# Patient Record
Sex: Female | Born: 1950
Health system: Southern US, Community
[De-identification: ages and names within clinical notes are randomized; demographics above are authoritative.]

## PROBLEM LIST (undated history)

## (undated) DIAGNOSIS — M797 Fibromyalgia: Secondary | ICD-10-CM

## (undated) DIAGNOSIS — H409 Unspecified glaucoma: Secondary | ICD-10-CM

## (undated) DIAGNOSIS — Z9221 Personal history of antineoplastic chemotherapy: Secondary | ICD-10-CM

## (undated) DIAGNOSIS — E785 Hyperlipidemia, unspecified: Secondary | ICD-10-CM

## (undated) DIAGNOSIS — E119 Type 2 diabetes mellitus without complications: Secondary | ICD-10-CM

## (undated) DIAGNOSIS — C50919 Malignant neoplasm of unspecified site of unspecified female breast: Secondary | ICD-10-CM

## (undated) DIAGNOSIS — E7800 Pure hypercholesterolemia, unspecified: Secondary | ICD-10-CM

## (undated) DIAGNOSIS — Z923 Personal history of irradiation: Secondary | ICD-10-CM

## (undated) DIAGNOSIS — M1990 Unspecified osteoarthritis, unspecified site: Secondary | ICD-10-CM

## (undated) HISTORY — PX: SHOULDER SURGERY: SHX246

## (undated) HISTORY — DX: Malignant neoplasm of unspecified site of unspecified female breast: C50.919

## (undated) HISTORY — PX: KNEE ARTHROSCOPY: SUR90

## (undated) HISTORY — DX: Type 2 diabetes mellitus without complications: E11.9

## (undated) HISTORY — DX: Unspecified glaucoma: H40.9

## (undated) HISTORY — DX: Pure hypercholesterolemia, unspecified: E78.00

## (undated) HISTORY — DX: Hyperlipidemia, unspecified: E78.5

## (undated) HISTORY — DX: Unspecified osteoarthritis, unspecified site: M19.90

## (undated) HISTORY — DX: Fibromyalgia: M79.7

---

## 1985-01-26 HISTORY — PX: VAGINAL HYSTERECTOMY: SUR661

## 1987-01-27 HISTORY — PX: APPENDECTOMY: SHX54

## 1987-01-27 HISTORY — PX: CHOLECYSTECTOMY: SHX55

## 1999-06-05 ENCOUNTER — Encounter: Payer: Self-pay | Admitting: Family Medicine

## 1999-06-05 ENCOUNTER — Encounter: Admission: RE | Admit: 1999-06-05 | Discharge: 1999-06-05 | Payer: Self-pay | Admitting: Family Medicine

## 1999-08-01 ENCOUNTER — Encounter: Payer: Self-pay | Admitting: Family Medicine

## 1999-08-01 ENCOUNTER — Ambulatory Visit (HOSPITAL_COMMUNITY): Admission: RE | Admit: 1999-08-01 | Discharge: 1999-08-01 | Payer: Self-pay | Admitting: Family Medicine

## 2000-01-27 DIAGNOSIS — C50919 Malignant neoplasm of unspecified site of unspecified female breast: Secondary | ICD-10-CM

## 2000-01-27 DIAGNOSIS — Z923 Personal history of irradiation: Secondary | ICD-10-CM

## 2000-01-27 HISTORY — DX: Malignant neoplasm of unspecified site of unspecified female breast: C50.919

## 2000-01-27 HISTORY — DX: Personal history of irradiation: Z92.3

## 2000-04-26 ENCOUNTER — Encounter: Payer: Self-pay | Admitting: *Deleted

## 2000-04-26 ENCOUNTER — Other Ambulatory Visit: Admission: RE | Admit: 2000-04-26 | Discharge: 2000-04-26 | Payer: Self-pay | Admitting: *Deleted

## 2000-04-26 ENCOUNTER — Encounter: Admission: RE | Admit: 2000-04-26 | Discharge: 2000-04-26 | Payer: Self-pay | Admitting: *Deleted

## 2000-04-26 HISTORY — PX: BREAST BIOPSY: SHX20

## 2000-05-05 ENCOUNTER — Encounter (HOSPITAL_BASED_OUTPATIENT_CLINIC_OR_DEPARTMENT_OTHER): Payer: Self-pay | Admitting: General Surgery

## 2000-05-06 ENCOUNTER — Ambulatory Visit (HOSPITAL_COMMUNITY): Admission: RE | Admit: 2000-05-06 | Discharge: 2000-05-06 | Payer: Self-pay | Admitting: General Surgery

## 2000-05-06 ENCOUNTER — Encounter (HOSPITAL_BASED_OUTPATIENT_CLINIC_OR_DEPARTMENT_OTHER): Payer: Self-pay | Admitting: General Surgery

## 2000-05-06 ENCOUNTER — Encounter (INDEPENDENT_AMBULATORY_CARE_PROVIDER_SITE_OTHER): Payer: Self-pay | Admitting: *Deleted

## 2000-05-14 HISTORY — PX: BREAST LUMPECTOMY: SHX2

## 2000-05-25 ENCOUNTER — Emergency Department (HOSPITAL_COMMUNITY): Admission: EM | Admit: 2000-05-25 | Discharge: 2000-05-25 | Payer: Self-pay | Admitting: Emergency Medicine

## 2000-05-26 DIAGNOSIS — Z9221 Personal history of antineoplastic chemotherapy: Secondary | ICD-10-CM

## 2000-05-26 HISTORY — DX: Personal history of antineoplastic chemotherapy: Z92.21

## 2000-06-07 ENCOUNTER — Encounter: Payer: Self-pay | Admitting: *Deleted

## 2000-06-07 ENCOUNTER — Ambulatory Visit (HOSPITAL_COMMUNITY): Admission: RE | Admit: 2000-06-07 | Discharge: 2000-06-07 | Payer: Self-pay | Admitting: *Deleted

## 2000-06-14 ENCOUNTER — Encounter (HOSPITAL_BASED_OUTPATIENT_CLINIC_OR_DEPARTMENT_OTHER): Payer: Self-pay | Admitting: General Surgery

## 2000-06-14 ENCOUNTER — Ambulatory Visit (HOSPITAL_COMMUNITY): Admission: RE | Admit: 2000-06-14 | Discharge: 2000-06-14 | Payer: Self-pay | Admitting: General Surgery

## 2000-09-28 ENCOUNTER — Ambulatory Visit: Admission: RE | Admit: 2000-09-28 | Discharge: 2000-12-27 | Payer: Self-pay | Admitting: Radiation Oncology

## 2000-09-30 ENCOUNTER — Encounter: Admission: RE | Admit: 2000-09-30 | Discharge: 2000-09-30 | Payer: Self-pay | Admitting: Radiation Oncology

## 2000-12-10 ENCOUNTER — Ambulatory Visit (HOSPITAL_COMMUNITY): Admission: RE | Admit: 2000-12-10 | Discharge: 2000-12-10 | Payer: Self-pay | Admitting: General Surgery

## 2001-04-27 ENCOUNTER — Encounter: Admission: RE | Admit: 2001-04-27 | Discharge: 2001-04-27 | Payer: Self-pay | Admitting: *Deleted

## 2001-04-27 ENCOUNTER — Encounter: Payer: Self-pay | Admitting: *Deleted

## 2001-06-03 ENCOUNTER — Encounter: Payer: Self-pay | Admitting: Family Medicine

## 2001-06-03 ENCOUNTER — Encounter: Admission: RE | Admit: 2001-06-03 | Discharge: 2001-06-03 | Payer: Self-pay | Admitting: Family Medicine

## 2001-06-29 ENCOUNTER — Other Ambulatory Visit: Admission: RE | Admit: 2001-06-29 | Discharge: 2001-06-29 | Payer: Self-pay | Admitting: Family Medicine

## 2002-05-01 ENCOUNTER — Encounter: Payer: Self-pay | Admitting: *Deleted

## 2002-05-01 ENCOUNTER — Encounter: Admission: RE | Admit: 2002-05-01 | Discharge: 2002-05-01 | Payer: Self-pay | Admitting: *Deleted

## 2002-10-17 ENCOUNTER — Other Ambulatory Visit: Admission: RE | Admit: 2002-10-17 | Discharge: 2002-10-17 | Payer: Self-pay | Admitting: Family Medicine

## 2003-02-10 ENCOUNTER — Encounter: Admission: RE | Admit: 2003-02-10 | Discharge: 2003-02-10 | Payer: Self-pay | Admitting: Orthopedic Surgery

## 2003-05-03 ENCOUNTER — Encounter: Admission: RE | Admit: 2003-05-03 | Discharge: 2003-05-03 | Payer: Self-pay | Admitting: Hematology & Oncology

## 2003-06-30 ENCOUNTER — Emergency Department (HOSPITAL_COMMUNITY): Admission: EM | Admit: 2003-06-30 | Discharge: 2003-06-30 | Payer: Self-pay

## 2003-08-01 ENCOUNTER — Encounter: Admission: RE | Admit: 2003-08-01 | Discharge: 2003-09-21 | Payer: Self-pay | Admitting: Orthopedic Surgery

## 2003-08-28 ENCOUNTER — Other Ambulatory Visit: Admission: RE | Admit: 2003-08-28 | Discharge: 2003-08-28 | Payer: Self-pay | Admitting: Family Medicine

## 2003-09-04 ENCOUNTER — Ambulatory Visit (HOSPITAL_COMMUNITY): Admission: RE | Admit: 2003-09-04 | Discharge: 2003-09-04 | Payer: Self-pay | Admitting: Family Medicine

## 2003-09-25 ENCOUNTER — Ambulatory Visit (HOSPITAL_COMMUNITY): Admission: RE | Admit: 2003-09-25 | Discharge: 2003-09-25 | Payer: Self-pay | Admitting: Obstetrics

## 2003-12-11 ENCOUNTER — Ambulatory Visit (HOSPITAL_COMMUNITY): Admission: RE | Admit: 2003-12-11 | Discharge: 2003-12-11 | Payer: Self-pay | Admitting: Orthopedic Surgery

## 2004-01-22 ENCOUNTER — Ambulatory Visit: Payer: Self-pay | Admitting: Hematology & Oncology

## 2004-05-05 ENCOUNTER — Encounter: Admission: RE | Admit: 2004-05-05 | Discharge: 2004-05-05 | Payer: Self-pay | Admitting: Hematology & Oncology

## 2004-07-22 ENCOUNTER — Ambulatory Visit: Payer: Self-pay | Admitting: Hematology & Oncology

## 2005-01-14 ENCOUNTER — Ambulatory Visit: Payer: Self-pay | Admitting: Hematology & Oncology

## 2005-05-06 ENCOUNTER — Encounter: Admission: RE | Admit: 2005-05-06 | Discharge: 2005-05-06 | Payer: Self-pay | Admitting: Hematology & Oncology

## 2005-07-16 ENCOUNTER — Ambulatory Visit: Payer: Self-pay | Admitting: Hematology & Oncology

## 2005-07-20 LAB — CBC WITH DIFFERENTIAL/PLATELET
BASO%: 0.7 % (ref 0.0–2.0)
Basophils Absolute: 0 10*3/uL (ref 0.0–0.1)
EOS%: 3.5 % (ref 0.0–7.0)
Eosinophils Absolute: 0.2 10*3/uL (ref 0.0–0.5)
HCT: 34.6 % — ABNORMAL LOW (ref 34.8–46.6)
HGB: 11.4 g/dL — ABNORMAL LOW (ref 11.6–15.9)
LYMPH%: 51 % — ABNORMAL HIGH (ref 14.0–48.0)
MCH: 28.1 pg (ref 26.0–34.0)
MCHC: 33.1 g/dL (ref 32.0–36.0)
MCV: 85.1 fL (ref 81.0–101.0)
MONO#: 0.4 10*3/uL (ref 0.1–0.9)
MONO%: 5.9 % (ref 0.0–13.0)
NEUT#: 2.6 10*3/uL (ref 1.5–6.5)
NEUT%: 38.9 % — ABNORMAL LOW (ref 39.6–76.8)
Platelets: 449 10*3/uL — ABNORMAL HIGH (ref 145–400)
RBC: 4.06 10*6/uL (ref 3.70–5.32)
RDW: 14 % (ref 11.3–14.5)
WBC: 6.7 10*3/uL (ref 3.9–10.0)
lymph#: 3.4 10*3/uL — ABNORMAL HIGH (ref 0.9–3.3)

## 2005-07-20 LAB — COMPREHENSIVE METABOLIC PANEL
ALT: 12 U/L (ref 0–40)
AST: 12 U/L (ref 0–37)
Albumin: 4.1 g/dL (ref 3.5–5.2)
Alkaline Phosphatase: 74 U/L (ref 39–117)
BUN: 13 mg/dL (ref 6–23)
CO2: 27 mEq/L (ref 19–32)
Calcium: 9.2 mg/dL (ref 8.4–10.5)
Chloride: 103 mEq/L (ref 96–112)
Creatinine, Ser: 0.7 mg/dL (ref 0.40–1.20)
Glucose, Bld: 85 mg/dL (ref 70–99)
Potassium: 3.6 mEq/L (ref 3.5–5.3)
Sodium: 139 mEq/L (ref 135–145)
Total Bilirubin: 0.3 mg/dL (ref 0.3–1.2)
Total Protein: 6.8 g/dL (ref 6.0–8.3)

## 2006-02-08 ENCOUNTER — Encounter: Admission: RE | Admit: 2006-02-08 | Discharge: 2006-03-11 | Payer: Self-pay | Admitting: Orthopedic Surgery

## 2006-03-01 ENCOUNTER — Encounter: Admission: RE | Admit: 2006-03-01 | Discharge: 2006-03-01 | Payer: Self-pay | Admitting: Orthopedic Surgery

## 2006-05-10 ENCOUNTER — Encounter: Admission: RE | Admit: 2006-05-10 | Discharge: 2006-05-10 | Payer: Self-pay | Admitting: Hematology & Oncology

## 2006-06-17 ENCOUNTER — Encounter: Admission: RE | Admit: 2006-06-17 | Discharge: 2006-06-17 | Payer: Self-pay | Admitting: Orthopedic Surgery

## 2006-07-17 ENCOUNTER — Encounter: Admission: RE | Admit: 2006-07-17 | Discharge: 2006-07-17 | Payer: Self-pay | Admitting: Orthopedic Surgery

## 2006-07-21 ENCOUNTER — Ambulatory Visit: Payer: Self-pay | Admitting: Hematology & Oncology

## 2006-07-26 LAB — COMPREHENSIVE METABOLIC PANEL
ALT: 16 U/L (ref 0–35)
AST: 19 U/L (ref 0–37)
Albumin: 4.5 g/dL (ref 3.5–5.2)
Alkaline Phosphatase: 72 U/L (ref 39–117)
BUN: 13 mg/dL (ref 6–23)
CO2: 24 mEq/L (ref 19–32)
Calcium: 9.6 mg/dL (ref 8.4–10.5)
Chloride: 108 mEq/L (ref 96–112)
Creatinine, Ser: 0.81 mg/dL (ref 0.40–1.20)
Glucose, Bld: 116 mg/dL — ABNORMAL HIGH (ref 70–99)
Potassium: 4.1 mEq/L (ref 3.5–5.3)
Sodium: 145 mEq/L (ref 135–145)
Total Bilirubin: 0.4 mg/dL (ref 0.3–1.2)
Total Protein: 6.9 g/dL (ref 6.0–8.3)

## 2006-07-26 LAB — CBC WITH DIFFERENTIAL/PLATELET
BASO%: 0.4 % (ref 0.0–2.0)
Basophils Absolute: 0 10*3/uL (ref 0.0–0.1)
EOS%: 2.5 % (ref 0.0–7.0)
Eosinophils Absolute: 0.1 10*3/uL (ref 0.0–0.5)
HCT: 35.7 % (ref 34.8–46.6)
HGB: 12.1 g/dL (ref 11.6–15.9)
LYMPH%: 36.6 % (ref 14.0–48.0)
MCH: 28.5 pg (ref 26.0–34.0)
MCHC: 33.8 g/dL (ref 32.0–36.0)
MCV: 84.5 fL (ref 81.0–101.0)
MONO#: 0.2 10*3/uL (ref 0.1–0.9)
MONO%: 4 % (ref 0.0–13.0)
NEUT#: 2.7 10*3/uL (ref 1.5–6.5)
NEUT%: 56.5 % (ref 39.6–76.8)
Platelets: 304 10*3/uL (ref 145–400)
RBC: 4.23 10*6/uL (ref 3.70–5.32)
RDW: 14.2 % (ref 11.3–14.5)
WBC: 4.9 10*3/uL (ref 3.9–10.0)
lymph#: 1.8 10*3/uL (ref 0.9–3.3)

## 2006-07-26 LAB — LACTATE DEHYDROGENASE: LDH: 188 U/L (ref 94–250)

## 2007-01-13 ENCOUNTER — Ambulatory Visit (HOSPITAL_COMMUNITY): Admission: RE | Admit: 2007-01-13 | Discharge: 2007-01-14 | Payer: Self-pay | Admitting: Orthopedic Surgery

## 2007-03-18 ENCOUNTER — Encounter: Admission: RE | Admit: 2007-03-18 | Discharge: 2007-03-18 | Payer: Self-pay | Admitting: Family Medicine

## 2007-05-11 ENCOUNTER — Encounter: Admission: RE | Admit: 2007-05-11 | Discharge: 2007-05-11 | Payer: Self-pay | Admitting: Hematology & Oncology

## 2007-06-29 ENCOUNTER — Encounter: Admission: RE | Admit: 2007-06-29 | Discharge: 2007-06-29 | Payer: Self-pay | Admitting: Orthopedic Surgery

## 2007-07-11 ENCOUNTER — Encounter: Admission: RE | Admit: 2007-07-11 | Discharge: 2007-07-11 | Payer: Self-pay | Admitting: Orthopedic Surgery

## 2007-07-14 ENCOUNTER — Encounter: Admission: RE | Admit: 2007-07-14 | Discharge: 2007-07-14 | Payer: Self-pay | Admitting: Orthopedic Surgery

## 2007-07-21 ENCOUNTER — Ambulatory Visit: Payer: Self-pay | Admitting: Hematology & Oncology

## 2007-07-25 LAB — COMPREHENSIVE METABOLIC PANEL
ALT: 22 U/L (ref 0–35)
AST: 16 U/L (ref 0–37)
Albumin: 3.9 g/dL (ref 3.5–5.2)
Alkaline Phosphatase: 51 U/L (ref 39–117)
BUN: 16 mg/dL (ref 6–23)
CO2: 25 mEq/L (ref 19–32)
Calcium: 9.3 mg/dL (ref 8.4–10.5)
Chloride: 103 mEq/L (ref 96–112)
Creatinine, Ser: 0.78 mg/dL (ref 0.40–1.20)
Glucose, Bld: 108 mg/dL — ABNORMAL HIGH (ref 70–99)
Potassium: 4.1 mEq/L (ref 3.5–5.3)
Sodium: 139 mEq/L (ref 135–145)
Total Bilirubin: 0.3 mg/dL (ref 0.3–1.2)
Total Protein: 6.5 g/dL (ref 6.0–8.3)

## 2007-07-25 LAB — CBC WITH DIFFERENTIAL/PLATELET
BASO%: 0.3 % (ref 0.0–2.0)
Basophils Absolute: 0 10*3/uL (ref 0.0–0.1)
EOS%: 2.2 % (ref 0.0–7.0)
Eosinophils Absolute: 0.2 10*3/uL (ref 0.0–0.5)
HCT: 36.3 % (ref 34.8–46.6)
HGB: 12.1 g/dL (ref 11.6–15.9)
LYMPH%: 22.8 % (ref 14.0–48.0)
MCH: 28 pg (ref 26.0–34.0)
MCHC: 33.3 g/dL (ref 32.0–36.0)
MCV: 84.3 fL (ref 81.0–101.0)
MONO#: 0.4 10*3/uL (ref 0.1–0.9)
MONO%: 5.1 % (ref 0.0–13.0)
NEUT#: 5.4 10*3/uL (ref 1.5–6.5)
NEUT%: 69.6 % (ref 39.6–76.8)
Platelets: 347 10*3/uL (ref 145–400)
RBC: 4.31 10*6/uL (ref 3.70–5.32)
RDW: 15.2 % — ABNORMAL HIGH (ref 11.3–14.5)
WBC: 7.7 10*3/uL (ref 3.9–10.0)
lymph#: 1.8 10*3/uL (ref 0.9–3.3)

## 2007-08-25 ENCOUNTER — Encounter: Admission: RE | Admit: 2007-08-25 | Discharge: 2007-08-25 | Payer: Self-pay | Admitting: Orthopedic Surgery

## 2008-04-20 ENCOUNTER — Encounter: Admission: RE | Admit: 2008-04-20 | Discharge: 2008-04-20 | Payer: Self-pay | Admitting: Orthopedic Surgery

## 2008-05-11 ENCOUNTER — Encounter: Admission: RE | Admit: 2008-05-11 | Discharge: 2008-05-11 | Payer: Self-pay | Admitting: Hematology & Oncology

## 2008-07-06 ENCOUNTER — Ambulatory Visit: Payer: Self-pay | Admitting: Hematology & Oncology

## 2008-09-25 ENCOUNTER — Ambulatory Visit: Payer: Self-pay | Admitting: Hematology & Oncology

## 2008-09-26 LAB — CBC WITH DIFFERENTIAL (CANCER CENTER ONLY)
BASO#: 0.1 10*3/uL (ref 0.0–0.2)
BASO%: 0.5 % (ref 0.0–2.0)
EOS%: 0.7 % (ref 0.0–7.0)
Eosinophils Absolute: 0.1 10*3/uL (ref 0.0–0.5)
HCT: 37.6 % (ref 34.8–46.6)
HGB: 12.6 g/dL (ref 11.6–15.9)
LYMPH#: 1.9 10*3/uL (ref 0.9–3.3)
LYMPH%: 19.7 % (ref 14.0–48.0)
MCH: 28 pg (ref 26.0–34.0)
MCHC: 33.6 g/dL (ref 32.0–36.0)
MCV: 83 fL (ref 81–101)
MONO#: 0.3 10*3/uL (ref 0.1–0.9)
MONO%: 3.1 % (ref 0.0–13.0)
NEUT#: 7.2 10*3/uL — ABNORMAL HIGH (ref 1.5–6.5)
NEUT%: 76 % (ref 39.6–80.0)
Platelets: 399 10*3/uL (ref 145–400)
RBC: 4.5 10*6/uL (ref 3.70–5.32)
RDW: 13 % (ref 10.5–14.6)
WBC: 9.5 10*3/uL (ref 3.9–10.0)

## 2008-09-26 LAB — COMPREHENSIVE METABOLIC PANEL
ALT: 21 U/L (ref 0–35)
AST: 19 U/L (ref 0–37)
Albumin: 4.6 g/dL (ref 3.5–5.2)
Alkaline Phosphatase: 68 U/L (ref 39–117)
BUN: 9 mg/dL (ref 6–23)
CO2: 24 mEq/L (ref 19–32)
Calcium: 10.2 mg/dL (ref 8.4–10.5)
Chloride: 101 mEq/L (ref 96–112)
Creatinine, Ser: 0.7 mg/dL (ref 0.40–1.20)
Glucose, Bld: 141 mg/dL — ABNORMAL HIGH (ref 70–99)
Potassium: 4 mEq/L (ref 3.5–5.3)
Sodium: 140 mEq/L (ref 135–145)
Total Bilirubin: 0.3 mg/dL (ref 0.3–1.2)
Total Protein: 7.2 g/dL (ref 6.0–8.3)

## 2008-09-27 ENCOUNTER — Ambulatory Visit (HOSPITAL_COMMUNITY): Admission: RE | Admit: 2008-09-27 | Discharge: 2008-09-27 | Payer: Self-pay | Admitting: Hematology & Oncology

## 2009-05-13 ENCOUNTER — Encounter: Admission: RE | Admit: 2009-05-13 | Discharge: 2009-05-13 | Payer: Self-pay | Admitting: Hematology & Oncology

## 2009-09-24 ENCOUNTER — Ambulatory Visit: Payer: Self-pay | Admitting: Hematology & Oncology

## 2009-10-23 LAB — CBC WITH DIFFERENTIAL (CANCER CENTER ONLY)
BASO#: 0 10*3/uL (ref 0.0–0.2)
BASO%: 0.5 % (ref 0.0–2.0)
EOS%: 3.3 % (ref 0.0–7.0)
Eosinophils Absolute: 0.2 10*3/uL (ref 0.0–0.5)
HCT: 36.1 % (ref 34.8–46.6)
HGB: 12 g/dL (ref 11.6–15.9)
LYMPH#: 3.2 10*3/uL (ref 0.9–3.3)
LYMPH%: 51.7 % — ABNORMAL HIGH (ref 14.0–48.0)
MCH: 27.7 pg (ref 26.0–34.0)
MCHC: 33.4 g/dL (ref 32.0–36.0)
MCV: 83 fL (ref 81–101)
MONO#: 0.2 10*3/uL (ref 0.1–0.9)
MONO%: 3.8 % (ref 0.0–13.0)
NEUT#: 2.5 10*3/uL (ref 1.5–6.5)
NEUT%: 40.7 % (ref 39.6–80.0)
Platelets: 339 10*3/uL (ref 145–400)
RBC: 4.35 10*6/uL (ref 3.70–5.32)
RDW: 12.2 % (ref 10.5–14.6)
WBC: 6.2 10*3/uL (ref 3.9–10.0)

## 2009-10-23 LAB — TECHNOLOGIST REVIEW CHCC SATELLITE

## 2009-10-23 LAB — COMPREHENSIVE METABOLIC PANEL
ALT: 16 U/L (ref 0–35)
AST: 18 U/L (ref 0–37)
Albumin: 4.2 g/dL (ref 3.5–5.2)
Alkaline Phosphatase: 75 U/L (ref 39–117)
BUN: 14 mg/dL (ref 6–23)
CO2: 28 mEq/L (ref 19–32)
Calcium: 9.3 mg/dL (ref 8.4–10.5)
Chloride: 102 mEq/L (ref 96–112)
Creatinine, Ser: 0.91 mg/dL (ref 0.40–1.20)
Glucose, Bld: 113 mg/dL — ABNORMAL HIGH (ref 70–99)
Potassium: 4.1 mEq/L (ref 3.5–5.3)
Sodium: 140 mEq/L (ref 135–145)
Total Bilirubin: 0.3 mg/dL (ref 0.3–1.2)
Total Protein: 6.7 g/dL (ref 6.0–8.3)

## 2009-10-23 LAB — VITAMIN D 25 HYDROXY (VIT D DEFICIENCY, FRACTURES): Vit D, 25-Hydroxy: 52 ng/mL (ref 30–89)

## 2009-11-15 ENCOUNTER — Encounter: Admission: RE | Admit: 2009-11-15 | Discharge: 2009-11-15 | Payer: Self-pay | Admitting: Orthopedic Surgery

## 2010-04-21 ENCOUNTER — Other Ambulatory Visit: Payer: Self-pay | Admitting: Hematology & Oncology

## 2010-04-21 DIAGNOSIS — Z1231 Encounter for screening mammogram for malignant neoplasm of breast: Secondary | ICD-10-CM

## 2010-05-15 ENCOUNTER — Ambulatory Visit
Admission: RE | Admit: 2010-05-15 | Discharge: 2010-05-15 | Disposition: A | Discharge status: Home / Self Care | Payer: BC Managed Care – PPO | Source: Ambulatory Visit | Attending: Hematology & Oncology | Admitting: Hematology & Oncology

## 2010-05-15 DIAGNOSIS — Z1231 Encounter for screening mammogram for malignant neoplasm of breast: Secondary | ICD-10-CM

## 2010-06-10 NOTE — Op Note (Signed)
[name], Linda [name]               ACCOUNT NO.:  [id number]   MEDICAL RECORD NO.:  [id number]          PATIENT TYPE:  AMB   LOCATION:  SDS                          FACILITY:  [facility]   PHYSICIAN:  Myrtie Neither, MD      DATE OF BIRTH:  July 01, 1950   DATE OF PROCEDURE:  01/13/2007  DATE OF DISCHARGE:                               OPERATIVE REPORT   PREOPERATIVE DIAGNOSIS:  Internal derangement of right knee.   POSTOPERATIVE DIAGNOSIS:  Lateral femoral condylar defect, loose bodies,  chronic synovitis with plica.   OPERATION PERFORMED:  Complete synovectomy, removal of loose body,  chondroplasty, lateral femoral condyle.   SURGEON:  Myrtie Neither, MD   ANESTHESIA:  General.   DESCRIPTION OF PROCEDURE:  The patient was taken to the operating room  after given adequate preoperative medications, given general anesthesia  and was intubated.  The right knee was prepped with DuraPrep and draped  in sterile manner.  Tourniquet used for hemostasis.  One half inch  puncture wound made anterior medial and laterally.  Inflow was through  the medial suprapatellar pouch area.  Inspection of the joint revealed  tremendously hypertrophic synovium, encompassing the entire anterior  joint line with joint space with fibrotic plica.  There was loose  condylar fragment approximately the size of a 5 cent piece with a  condylar defect involving the lateral femoral condyle size of a 25 cent  piece.  Other small loose fragments were about inside the joint.  With  the synovial shaver, complete synovectomy with excision of plica was  done.  Removal of the loose fragment was then done followed by  chondroplasty of the lateral femoral condyle.  This was further smoothed  with use of arthroscopic rasp.  Anterior cruciate ligament was  demonstrated old thickened traumatic changes with thick fibrosis but was  intact.  Lateral and medial menisci were intact.  Medial femoral condyle  was well preserved.  There were  chondromalacic changes involving the  patella and the intercondylar notch.  After copious irrigation and  further inspection of the joint, no other loose fragments were  identified.  Wound closure was then done with 4-0 nylon, 15 mL of 0.25%  Marcaine was injected into the knee.  Bulky compressive dressing was  applied.  The patient tolerated the procedure well went to recovery room  in stable and satisfactory condition.  The patient is being discharged  to home, Percocet one to two every four hours as needed for pain, ice  packs, elevation, use of walker or crutches, nonweightbearing on the  right side and to return to the office in one week.  The patient is  being discharged in stable and satisfactory condition.      Myrtie Neither, MD  Electronically Signed     AC/MEDQ  D:  01/13/2007  T:  01/13/2007  Job:  [id number]

## 2010-06-13 NOTE — H&P (Signed)
NAMEMAUDY, YONAN               ACCOUNT NO.:  [id number]   MEDICAL RECORD NO.:  [id number]          PATIENT TYPE:  OIB   LOCATION:  2899                         FACILITY:  [facility]   PHYSICIAN:  Myrtie Neither, MD      DATE OF BIRTH:  August 01, 1950   DATE OF ADMISSION:  12/11/2003  DATE OF DISCHARGE:                                HISTORY & PHYSICAL   CHIEF COMPLAINT:  Painful left shoulder.   HISTORY OF PRESENT ILLNESS:  This is a 60 year old female who I have been  following in the office for an impingement and subacromial bursitis of the  left shoulder over the past several months.  The patient has been treated  with anti-inflammatories and therapeutic injections and compresses with  worsening of symptoms over the last two months.  The patient had MRI which  demonstrated impingement without rotator cuff tear.   PAST MEDICAL HISTORY:  Left foot surgery, hysterectomy, appendectomy,  cholecystectomy, lumpectomy left breast, Port-A-Cath, history of anemia,  hiatal hernia, fibromyalgia.   REVIEW OF SYSTEMS:  Constipation, symptoms of reflux, musculoskeletal pain  involving fibromyalgia.   ALLERGIES:  None known.   MEDICATIONS:  1.  Lodine.  2.  Zoloft.  3.  Allegra D.  4.  Tramadol.  5.  Diazepam.  6.  Skelaxin.  7.  Lunesta.  8.  Zelnorm.  9.  Decadron.  10. Hydrocodone.  11. Cymbalta.   SOCIAL HISTORY:  No history of the use of alcohol.  The patient quit smoking  15 years ago.  The patient lives with her husband.   FAMILY HISTORY:  Noncontributory.   PHYSICAL EXAMINATION:  GENERAL:  Alert and oriented, no acute distress.  VITAL SIGNS:  Temperature 97.6, pulse 82, respirations 16, blood pressure  109/69, height 62.5 inches, weight 130.  HEENT:  Head normocephalic.  Eyes:  Extraocular muscles, sclerae clear.  NECK:  Supple.  CHEST:  Clear.  CARDIAC:  S1, S2 regular.  EXTREMITIES:  Left shoulder tender anteriorly and laterally with subacromial  crepitus.  Pain on  abduction above 90 degrees and on resistive abduction.   MRI done demonstrates impingement without rotator cuff tear.   IMPRESSION:  Impingement syndrome, left shoulder.   PLAN:  Arthroscopic acromioplasty, left shoulder.      AC/MEDQ  D:  12/11/2003  T:  12/11/2003  Job:  [id number]

## 2010-06-13 NOTE — Op Note (Signed)
[place]. [facility]  Patient:    Linda [name], Linda [name]                      MRN: [id number] Proc. Date: 05/06/00 Adm. Date:  [id number] Attending:  Sonda Primes                           Operative Report  PREOPERATIVE DIAGNOSIS:  Carcinoma of left breast.  POSTOPERATIVE DIAGNOSIS:  Carcinoma of left breast.  OPERATION PERFORMED:  Partial mastectomy, left breast with left-sided sentinel lymph node biopsy.  SURGEON:  Mardene Celeste. Lurene Shadow, M.D.  ASSISTANT:  Nurse.  ANESTHESIA:  General.  OPERATIVE FINDINGS:  Tumor, upper outer quadrant of the left breast, completely excised with margins on Touch Prep free.  Sentinel lymph nodes dissected, two, with Touch Prep negative for metastatic tumor.  INDICATIONS FOR PROCEDURE:  This patient is a 60 year old woman presenting with a 3 to 5 cm mass in the left upper outer quadrant which on core biopsy shows an invasive ductal carcinoma.  She had no palpable lymph nodes.  She was brought to the operating room for lumpectomy and axillary dissection.  DESCRIPTION OF PROCEDURE:  Following induction of satisfactory anesthesia with the patient positioned supinely, the left breast was prepped and draped to be included in a sterile operative field.  I made a paracircumareolar incision, deepened this through the skin and subcutaneous tissues, raising a flap superiorly and laterally and another flap inferiorly and medially.  I excised a large wedge of breast tissue around the mass taking the entire upper outer quadrant of the breast in a partial mastectomy.  Dissection was carried all the way down to the pectoralis major muscle where the anterior pectoralis sheath was also removed along with the tumor.  This was forwarded for pathologic evaluation and Touch Prep on margins were clear.  Then using the Neoprobe, I entered the axilla by making transverse axillary incision and following the guidance of the Neoprobe down to  the hot spot where we dissected down on two large hot blue nodes.  These were dissected free and both forwarded for pathologic evaluation and both were negative for tumor. Hemostasis was assured in both wounds.  Sponge, instrument and sharp counts were verified.  The breast wound closed in two layers with interrupted 3-0 Vicryl sutures in the subcutaneous tissues and a running 5-0 Monocryl in the skin.  Reinforced with Steri-Strips.  Axillary wound similarly closed with interrupted 3-0 Vicryl sutures in the subcutaneous tissues and a running 5-0 Monocryl in the skin.  This was also reinforced with Steri-Strips.  Sterile compressive dressing applied.  Anesthetic reversed.  Patient removed from the operating room to the recovery room in stable condition having tolerated the procedure well. DD:  05/06/00 TD:  05/06/00 Job: [id number]

## 2010-06-13 NOTE — Op Note (Signed)
[place]. [facility]  Patient:    Linda [name], Linda [name]                      MRN: [id number] Proc. Date: 06/14/00 Adm. Date:  [id number] Disc. Date: [id number] Attending:  Sonda Primes                           Operative Report  PREOPERATIVE DIAGNOSIS:  Poor venous access.  POSTOPERATIVE DIAGNOSIS:  Poor venous access.  OPERATION PERFORMED:  Implantation of Port-A-Cath.  SURGEON:  Mardene Celeste. Lurene Shadow, M.D.  ASSISTANT:  Nurse.  ANESTHESIA:  MAC.  1% Xylocaine with and without epinephrine.  INDICATIONS FOR PROCEDURE:  The patient is a 60 year old woman status post lumpectomy and sentinel lymph node biopsy who is now being prepared for adjuvant chemotherapy.  She is brought to the operating room for Port-A-Cath placement.  DESCRIPTION OF PROCEDURE:  With the patient positioned supinely following the induction of satisfactory sedation, she was later placed in Trendelenburg position and the anterior neck and shoulders were prepped and draped to be included in sterile operative field.  The right subclavian region was infiltrated with 1% Xylocaine plain and a subclavian stick is made into that right subclavian artery and a guide wire threaded into the central venous system and positioned at the atrial vena caval junction.  This was accomplished under fluoroscopic evaluation.  Pocket was then created on the anterior chest wall by infiltrating the chest wall with 1% Xylocaine with epinephrine.  Making a transverse incision over the pectoralis muscle deepening this through the skin and subcutaneous tissues and removing a plug of fat so as to create a pocket.  Tunnel from the shoulder to the pocket is created and a Silastic catheter was pulled through from the pocket up through the shoulder.  I used a size 10 Cook introducer and dilator then to place over the guide wire and presents placed in the central venous system.  The dilator and guide wire were then  removed and the Silastic catheter was threaded down through the introducer and positioned at the atrial vena caval junction.  The inflow of heparinized saline and blood return from the Silastic catheter was noted to be excellent.  The introducer was peeled away without difficulty. External portion of the catheter in the pocket was then cut and the Port-A-Cath reservoir was attached to the Silastic catheter and then secured in place.  A reservoir was then sutured into the pocket with 2-0 silk sutures placed in three separate points.  In-flow of heparinized saline and blood return through the reservoir was noted to be excellent.  Final evaluation fluoroscopically showed placement of the Port-A-Cath to be well seated.  The Silastic tubing was without unusual kinks, bends or turns and the tip of the Silastic catheter was noted at the atrial vena caval junction.  Sponge, instrument and sharp counts were verified.   Both wounds closed in two layers using interrupted 3-0 Vicryl sutures in the deep layer and a running 5-0 Monocryl in the skin.  The wounds were then reinforced with Steri-Strips and sterile dressings applied.  Anesthetic reversed.  Patient removed from the operating room to the recovery room in stable condition having tolerated the procedure well. DD:  06/14/00 TD:  06/14/00 Job: [id number]

## 2010-06-13 NOTE — Op Note (Signed)
Linda [name], Linda [name]               ACCOUNT NO.:  [id number]   MEDICAL RECORD NO.:  [id number]          PATIENT TYPE:  OIB   LOCATION:  2899                         FACILITY:  [facility]   PHYSICIAN:  Myrtie Neither, MD      DATE OF BIRTH:  December 12, 1950   DATE OF PROCEDURE:  12/11/2003  DATE OF DISCHARGE:                                 OPERATIVE REPORT   PREOPERATIVE DIAGNOSIS:  Impingement syndrome, left shoulder.   POSTOPERATIVE DIAGNOSIS:  Impingement syndrome, left shoulder.   ANESTHESIA:  General.   PROCEDURE:  Arthroscopic acromioplasty and decompression, left shoulder.   The patient was taken to the operating room after given adequate  preoperative medications, given general anesthesia and intubated.  The  patient was placed in the barber-chair position.  The left shoulder was  prepped with Duraprep and draped in a sterile manner.  A posterior incision  was made and placement of a swisher wire was placed posterior to anterior.  An anterior incision was then made with water inflow.  The shaver was placed  in the third position laterally.  Inspection of the joint reveals  tremendously hypertrophic subacromial bursal sac with eburnation and  chondromalacia changes of the subacromial surface.  The rotator cuff itself  was intact.  With the synovial shaver, a synovectomy was then done.  Next,  an acromioplasty was done and decompression.  Thorough irrigation and  debridement of the area was done.  After adequate decompression of the  shoulder and acromioplasty, the wounds were then closed with 4-0 nylon and  14 cc of 0.25% Marcaine with epinephrine was injected.  Compressive dressing  was applied.  A mobilizing sling was applied.  The patient tolerated the  procedure quite well, went to the recovery room in stable and satisfactory  condition.  The patient is being discharged home on Percocet 1-2 q.4h.  p.r.n. for pain, ice packs and to return to the office in one week.  The  patient is  being discharged in stable and satisfactory condition.      AC/MEDQ  D:  12/11/2003  T:  12/11/2003  Job:  [id number]

## 2010-10-22 ENCOUNTER — Other Ambulatory Visit: Payer: Self-pay | Admitting: Hematology & Oncology

## 2010-10-22 ENCOUNTER — Encounter (HOSPITAL_BASED_OUTPATIENT_CLINIC_OR_DEPARTMENT_OTHER): Payer: BC Managed Care – PPO | Admitting: Hematology & Oncology

## 2010-10-22 DIAGNOSIS — C50919 Malignant neoplasm of unspecified site of unspecified female breast: Secondary | ICD-10-CM

## 2010-10-22 LAB — CBC WITH DIFFERENTIAL (CANCER CENTER ONLY)
BASO#: 0 10*3/uL (ref 0.0–0.2)
BASO%: 0.4 % (ref 0.0–2.0)
EOS%: 2.4 % (ref 0.0–7.0)
Eosinophils Absolute: 0.2 10*3/uL (ref 0.0–0.5)
HCT: 35.7 % (ref 34.8–46.6)
HGB: 12.1 g/dL (ref 11.6–15.9)
LYMPH#: 4.1 10*3/uL — ABNORMAL HIGH (ref 0.9–3.3)
LYMPH%: 59.2 % — ABNORMAL HIGH (ref 14.0–48.0)
MCH: 28.3 pg (ref 26.0–34.0)
MCHC: 33.9 g/dL (ref 32.0–36.0)
MCV: 83 fL (ref 81–101)
MONO#: 0.4 10*3/uL (ref 0.1–0.9)
MONO%: 5.9 % (ref 0.0–13.0)
NEUT#: 2.2 10*3/uL (ref 1.5–6.5)
NEUT%: 32.1 % — ABNORMAL LOW (ref 39.6–80.0)
Platelets: 319 10*3/uL (ref 145–400)
RBC: 4.28 10*6/uL (ref 3.70–5.32)
RDW: 14.1 % (ref 11.1–15.7)
WBC: 7 10*3/uL (ref 3.9–10.0)

## 2010-10-22 LAB — COMPREHENSIVE METABOLIC PANEL
ALT: 11 U/L (ref 0–35)
AST: 16 U/L (ref 0–37)
Albumin: 4.4 g/dL (ref 3.5–5.2)
Alkaline Phosphatase: 59 U/L (ref 39–117)
BUN: 10 mg/dL (ref 6–23)
CO2: 28 mEq/L (ref 19–32)
Calcium: 9.7 mg/dL (ref 8.4–10.5)
Chloride: 100 mEq/L (ref 96–112)
Creatinine, Ser: 0.88 mg/dL (ref 0.50–1.10)
Glucose, Bld: 78 mg/dL (ref 70–99)
Potassium: 3.6 mEq/L (ref 3.5–5.3)
Sodium: 138 mEq/L (ref 135–145)
Total Bilirubin: 0.4 mg/dL (ref 0.3–1.2)
Total Protein: 6.9 g/dL (ref 6.0–8.3)

## 2010-10-22 LAB — VITAMIN D 25 HYDROXY (VIT D DEFICIENCY, FRACTURES): Vit D, 25-Hydroxy: 43 ng/mL (ref 30–89)

## 2010-10-31 LAB — URINALYSIS, ROUTINE W REFLEX MICROSCOPIC
Glucose, UA: NEGATIVE
Hgb urine dipstick: NEGATIVE
Ketones, ur: NEGATIVE
Nitrite: NEGATIVE
Protein, ur: NEGATIVE
Specific Gravity, Urine: 1.008
Urobilinogen, UA: 0.2
pH: 6.5

## 2010-10-31 LAB — URINE MICROSCOPIC-ADD ON

## 2010-10-31 LAB — COMPREHENSIVE METABOLIC PANEL
ALT: 17
AST: 19
Albumin: 3.9
Alkaline Phosphatase: 64
BUN: 12
CO2: 30
Calcium: 10
Chloride: 103
Creatinine, Ser: 0.76
GFR calc Af Amer: >60
GFR calc non Af Amer: >60
Glucose, Bld: 91
Potassium: 4.4
Sodium: 139
Total Bilirubin: 0.7
Total Protein: 6.4

## 2010-10-31 LAB — CBC
HCT: 36
Hemoglobin: 11.8 — ABNORMAL LOW
MCHC: 32.8
MCV: 86.5
Platelets: 420 — ABNORMAL HIGH
RBC: 4.17
RDW: 14.3
WBC: 6.9

## 2010-12-19 ENCOUNTER — Institutional Professional Consult (permissible substitution): Payer: BC Managed Care – PPO | Admitting: Internal Medicine

## 2011-01-29 ENCOUNTER — Ambulatory Visit (INDEPENDENT_AMBULATORY_CARE_PROVIDER_SITE_OTHER): Payer: BC Managed Care – PPO | Admitting: Internal Medicine

## 2011-01-29 ENCOUNTER — Encounter: Payer: Self-pay | Admitting: Internal Medicine

## 2011-01-29 VITALS — BP 118/70 | HR 109 | Ht 62.5 in | Wt 150.4 lb

## 2011-01-29 DIAGNOSIS — G4700 Insomnia, unspecified: Secondary | ICD-10-CM

## 2011-01-29 DIAGNOSIS — J3089 Other allergic rhinitis: Secondary | ICD-10-CM

## 2011-01-29 DIAGNOSIS — F5104 Psychophysiologic insomnia: Secondary | ICD-10-CM

## 2011-01-29 DIAGNOSIS — M797 Fibromyalgia: Secondary | ICD-10-CM

## 2011-01-29 DIAGNOSIS — IMO0001 Reserved for inherently not codable concepts without codable children: Secondary | ICD-10-CM

## 2011-01-29 DIAGNOSIS — J309 Allergic rhinitis, unspecified: Secondary | ICD-10-CM

## 2011-01-29 NOTE — Assessment & Plan Note (Signed)
She described nasal congestion and nose blowing bothersome enough to affect her sleep at times, randomly through the year. Any additional comfort may improve her ability to sleep. Plan-trial of nasal steroid spray.

## 2011-01-29 NOTE — Progress Notes (Signed)
01/29/11- 28 yoF former smoker seen for problems with initiating and maintaining sleep, on kind referral from Dr. Parke Simmers. She is a married woman sharing home with husband and daughter, sleeps with husband. 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 At least since her teens, she has had difficulty with sleep. This got worse about 20 years ago with onset of fibromyalgia. Husband has told her she snores but has not reported apnea. Sleeps sometimes disturbed by nasal congestion and she only sleeps on her left side because of arthritis and other somatic pains. Bedtime between 10 PM and 11 PM estimating sleep onset 30-45 minutes with medication. May wake between 2 and 4 times, sometimes prolonged, before up at 7:30 AM. Rarely naps. Has stopped habit of one cup of decaf coffee in the morning and little other caffeine. Medical history reviewed. Admits some depression at times when in prolonged pain. No thyroid history. Has been taking an OTC product "Super sleep" which includes melatonin. Takes all medications at bedtime, significantly including clonazepam 2 mg and Wellbutrin XL 300 mg, sometimes an antihistamine or pain med. She had tried Ambien in the past. Disliked Lunesta.  ROS-HPI Constitutional:   No-   weight loss, night sweats, fevers, chills, fatigue, lassitude. HEENT:   No-  headaches, difficulty swallowing, tooth/dental problems, sore throat,       +  sneezing, itching, ear ache, nasal congestion, post nasal drip,  CV:  No-   chest pain, orthopnea, PND, swelling in lower extremities, anasarca, dizziness, palpitations Resp: No-   shortness of breath with exertion or at rest.              No-   productive cough,  No non-productive cough,  No- coughing up of blood.              No-   change in color of mucus.  No- wheezing.   Skin: No-   rash or lesions. GI:  No-   heartburn, indigestion, abdominal pain, nausea, vomiting, diarrhea,                 change in bowel habits, loss of  appetite GU:  MS:  +   joint pain or swelling.  No- decreased range of motion.  No- back pain. Neuro-     nothing unusual Psych:  No- change in mood or affect. Some depression or anxiety.  No memory loss.  OBJ General- Alert, Oriented, Affect-appropriate, pleasant and interactive, Distress- none acute, WDWN Skin- rash-none, lesions- none, excoriation- none Lymphadenopathy- none Head- atraumatic            Eyes- Gross vision intact, PERRLA, conjunctivae clear secretions            Ears- Hearing, canals-normal            Nose- Clear, no-Septal dev, mucus, polyps, erosion, perforation             Throat- Mallampati II , mucosa clear , drainage- none, tonsils- atrophic Neck- flexible , trachea midline, no stridor , thyroid nl, carotid no bruit Chest - symmetrical excursion , unlabored           Heart/CV- RRR , no murmur , no gallop  , no rub, nl s1 s2                           - JVD- none , edema- none, stasis changes- none, varices- none  Lung- clear to P&A, wheeze- none, cough- none , dullness-none, rub- none           Chest wall-  Abd- tender-no, distended-no, bowel sounds-present, HSM- no Br/ Gen/ Rectal- Not done, not indicated Extrem- cyanosis- none, clubbing, none, atrophy- none, strength- nl Neuro- grossly intact to observation

## 2011-01-29 NOTE — Patient Instructions (Addendum)
Think in terms of what could be more comfortable and conducive to sleep at night: quiet, cool, dark bedroom with layers of blankets you can remove as needed. Read or listen to music that is distracting and pleasant until ready to turn out light.   Consider the website for cognitive behavioral therapy-     Cbtforinsomnia.com  Morning daylight exposure may help fix the day- night distinction better for your brain.   Sample Veramyst nasal steroid spray    2 puffs in each nostril every night at bedtime. By the time it used up, see if it helps your nose stay more comfortable  Ask your husband to notice if you are actually stopping breathing much, associated with the snoring

## 2011-01-29 NOTE — Assessment & Plan Note (Signed)
Chronic nonspecific insomnia. This is part of a long-term pattern and probably "hard wired". Her somatic discomfort compounded the problem. She did not recognize easy response to Ambien or Lunesta and is already taking 2 mg of clonazepam as well as the other listed medicines at bedtime. We may not be able to do better medically. I discussed sleep hygiene issues with emphasis on maintaining a dark, quiet, cool bedroom, regular light/dark cycle, and relaxation habits near bedtime. I discussed cognitive behavioral therapy and gave direction.

## 2011-03-13 ENCOUNTER — Ambulatory Visit: Payer: BC Managed Care – PPO | Admitting: Internal Medicine

## 2011-04-11 ENCOUNTER — Emergency Department (HOSPITAL_COMMUNITY)
Admission: EM | Admit: 2011-04-11 | Discharge: 2011-04-11 | Disposition: A | Discharge status: Home / Self Care | Payer: BC Managed Care – PPO | Attending: Emergency Medicine | Admitting: Emergency Medicine

## 2011-04-11 ENCOUNTER — Encounter (HOSPITAL_COMMUNITY): Payer: Self-pay | Admitting: *Deleted

## 2011-04-11 DIAGNOSIS — M25559 Pain in unspecified hip: Secondary | ICD-10-CM | POA: Insufficient documentation

## 2011-04-11 DIAGNOSIS — E785 Hyperlipidemia, unspecified: Secondary | ICD-10-CM | POA: Insufficient documentation

## 2011-04-11 DIAGNOSIS — M25569 Pain in unspecified knee: Secondary | ICD-10-CM | POA: Insufficient documentation

## 2011-04-11 DIAGNOSIS — M25551 Pain in right hip: Secondary | ICD-10-CM

## 2011-04-11 DIAGNOSIS — IMO0001 Reserved for inherently not codable concepts without codable children: Secondary | ICD-10-CM | POA: Insufficient documentation

## 2011-04-11 MED ORDER — PREDNISONE 20 MG PO TABS
60.0000 mg | ORAL_TABLET | Freq: Once | ORAL | Status: AC
  Administered 2011-04-11: 60 mg via ORAL
  Filled 2011-04-11: qty 3

## 2011-04-11 MED ORDER — HYDROMORPHONE HCL PF 2 MG/ML IJ SOLN
2.0000 mg | Freq: Once | INTRAMUSCULAR | Status: AC
  Administered 2011-04-11: 2 mg via INTRAMUSCULAR
  Filled 2011-04-11: qty 1

## 2011-04-11 MED ORDER — ONDANSETRON 4 MG PO TBDP
8.0000 mg | ORAL_TABLET | Freq: Once | ORAL | Status: AC
  Administered 2011-04-11: 8 mg via ORAL
  Filled 2011-04-11: qty 2

## 2011-04-11 MED ORDER — IBUPROFEN 800 MG PO TABS: 800.0000 mg | ORAL_TABLET | Freq: Once | ORAL | Status: DC

## 2011-04-11 MED ORDER — PREDNISONE 20 MG PO TABS: 40.0000 mg | ORAL_TABLET | Freq: Every day | ORAL | Status: AC

## 2011-04-11 NOTE — Discharge Instructions (Signed)
Please review the instructions below. You were seen tonight in the emergency department for your chronic right knee and right hip pain. You were medicated for your pain and admit the you're feeling better. Take the prednisone as directed and be sure to complete. Continue any previous care regimens as instructed by Dr. Montez Morita. Follow up with Dr. Montez Morita this Thursday or Friday as planned. Return for worsening symptoms.     Arthralgia Arthralgia is joint pain. A joint is a place where two bones meet. Joint pain can happen for many reasons. The joint can be bruised, stiff, infected, or weak from aging. Pain usually goes away after resting and taking medicine for soreness.  HOME CARE  Rest the joint as told by your doctor.   Keep the sore joint raised (elevated) for the first 24 hours.   Put ice on the joint area.   Put ice in a plastic bag.   Place a towel between your skin and the bag.   Leave the ice on for 15 to 20 minutes, 3 to 4 times a day.   Wear your splint, casting, elastic bandage, or sling as told by your doctor.   Only take medicine as told by your doctor. Do not take aspirin.   Use crutches as told by your doctor. Do not put weight on the joint until told to by your doctor.  GET HELP RIGHT AWAY IF:   You have bruising, puffiness (swelling), or more pain.   Your fingers or toes turn blue or start to lose feeling (numb).   Your medicine does not lessen the pain.   Your pain becomes severe.   You have a temperature by mouth above 102 F (38.9 C), not controlled by medicine.   You cannot move or use the joint.  MAKE SURE YOU:   Understand these instructions.   Will watch your condition.   Will get help right away if you are not doing well or get worse.  Document Released: 12/31/2008 Document Revised: 01/01/2011 Document Reviewed: 12/31/2008 [facility] Patient Information 2012 [place], [place].Hip Pain The hips join the upper legs to the lower pelvis. The bones,  cartilage, tendons, and muscles of the hip joint perform a lot of work each day holding your body weight and allowing you to move around. Hip pain is a common symptom. It can range from a minor ache to severe pain on 1 or both hips. Pain may be felt on the inside of the hip joint near the groin, or the outside near the buttocks and upper thigh. There may be swelling or stiffness as well. It occurs more often when a person walks or performs activity. There are many reasons hip pain can develop. CAUSES  It is important to work with your caregiver to identify the cause since many conditions can impact the bones, cartilage, muscles, and tendons of the hips. Causes for hip pain include:  Broken (fractured) bones.   Separation of the thighbone from the hip socket (dislocation).   Torn cartilage of the hip joint.   Swelling (inflammation) of a tendon (tendonitis), the sac within the hip joint (bursitis), or a joint.   A weakening in the abdominal wall (hernia), affecting the nerves to the hip.   Arthritis in the hip joint or lining of the hip joint.   Pinched nerves in the back, hip, or upper thigh.   A bulging disc in the spine (herniated disc).   Rarely, bone infection or cancer.  DIAGNOSIS  The location of  your hip pain will help your caregiver understand what may be causing the pain. A diagnosis is based on your medical history, your symptoms, results from your physical exam, and results from diagnostic tests. Diagnostic tests may include X-ray exams, a computerized magnetic scan (magnetic resonance imaging, MRI), or bone scan. TREATMENT  Treatment will depend on the cause of your hip pain. Treatment may include:  Limiting activities and resting until symptoms improve.   Crutches or other walking supports (a cane or brace).   Ice, elevation, and compression.   Physical therapy or home exercises.   Shoe inserts or special shoes.   Losing weight.   Medications to reduce pain.    Undergoing surgery.  HOME CARE INSTRUCTIONS   Only take over-the-counter or prescription medicines for pain, discomfort, or fever as directed by your caregiver.   Put ice on the injured area:   Put ice in a plastic bag.   Place a towel between your skin and the bag.   Leave the ice on for 15 to 20 minutes at a time, 3 to 4 times a day.   Keep your leg raised (elevated) when possible to lessen swelling.   Avoid activities that cause pain.   Follow specific exercises as directed by your caregiver.   Sleep with a pillow between your legs on your most comfortable side.   Record how often you have hip pain, the location of the pain, and what it feels like. This information may be helpful to you and your caregiver.   Ask your caregiver about returning to work or sports and whether you should drive.   Follow up with your caregiver for further exams, therapy, or testing as directed.  SEEK MEDICAL CARE IF:   Your pain or swelling continues or worsens after 1 week.   You are feeling unwell or have chills.   You have increasing difficulty with walking.   You have a loss of sensation or other new symptoms.   You have questions or concerns.  SEEK IMMEDIATE MEDICAL CARE IF:   You cannot put weight on the affected hip.   You have fallen.   You have a sudden increase in pain and swelling in your hip.   You have a fever.  MAKE SURE YOU:   Understand these instructions.   Will watch your condition.   Will get help right away if you are not doing well or get worse.  Document Released: 07/02/2009 Document Revised: 01/01/2011 Document Reviewed: 07/02/2009 [facility] Patient Information 2012 [place], [place].Knee Pain Knee pain can be a result of an injury or other medical conditions. Treatment will depend on the cause of your pain. HOME CARE  Only take medicine as told by your doctor.   Keep a healthy weight. Being overweight can make the knee hurt more.   Stretch before  exercising or playing sports.   If there is constant knee pain, change the way you exercise. Ask your doctor for advice.   Make sure shoes fit well. Choose the right shoe for the sport or activity.   Protect your knees. Wear kneepads if needed.   Rest when you are tired.  GET HELP RIGHT AWAY IF:   Your knee pain does not stop.   Your knee pain does not get better.   Your knee joint feels hot to the touch.   You have a temperature by mouth above 102 F (38.9 C), not controlled by medicine.   Your baby is older than 3 months with  a rectal temperature of 102 F (38.9 C) or higher.   Your baby is 48 months old or younger with a rectal temperature of 100.4 F (38 C) or higher.  MAKE SURE YOU:   Understand these instructions.   Will watch this condition.   Will get help right away if you are not doing well or get worse.  Document Released: 04/10/2008 Document Revised: 01/01/2011 Document Reviewed: 04/10/2008 [facility] Patient Information 2012 [place], [place].

## 2011-04-11 NOTE — ED Provider Notes (Signed)
Medical screening examination/treatment/procedure(s) were performed by non-physician practitioner and as supervising physician I was immediately available for consultation/collaboration.   Ronnica Dreese W Franklyn Cafaro, MD 04/11/11 0722 

## 2011-04-11 NOTE — ED Provider Notes (Signed)
History     CSN: [id number]  Arrival date & time 04/11/11  0010   First MD Initiated Contact with Patient 04/11/11 0202      Chief Complaint  Patient presents with  . Knee Pain    HPI: Patient is a 61 y.o. female presenting with knee pain. The history is provided by the patient.  Knee Pain This is a chronic problem. The current episode started yesterday. The problem occurs constantly. The problem has been gradually worsening. Associated symptoms include numbness. Pertinent negatives include no joint swelling or weakness. The symptoms are aggravated by walking and standing. She has tried rest and oral narcotics for the symptoms.  Patient reports persistent right hip and right knee pain. Admits this problem is chronic and she normally takes hydrocodone/APAP for pain. However that has not been working today. Admits to worsening right hip and right knee pain over the last 1 to 2 days. Is under Dr. Celene Skeen (Ortho) care for this pain. Has been told that her joints need replacing. States that Dr. Montez Morita normally treats her with Decadron and this usually gets her pain back to a manageable level. Patient already wearing a knee immobilizer and crutches provided to her by Dr. Montez Morita. Denies any new injuries.  Past Medical History  Diagnosis Date  . Hyperlipidemia   . Breast cancer 2002    left  . Fibromyalgia   . Allergic rhinitis     Past Surgical History  Procedure Date  . Cholecystectomy 1989  . Vaginal hysterectomy 1987  . Appendectomy 1989  . Breast lumpectomy 2002    left    Family History  Problem Relation Age of Onset  . Lung cancer Father   . Breast cancer Maternal Aunt     History  Substance Use Topics  . Smoking status: Former Smoker -- 1.5 packs/day for 20 years    Types: Cigarettes    Quit date: 01/27/1988  . Smokeless tobacco: Never Used  . Alcohol Use: No    OB History    Grav Para Term Preterm Abortions TAB SAB Ect Mult Living                  Review of  Systems  Constitutional: Negative.   HENT: Negative.   Eyes: Negative.   Respiratory: Negative.   Cardiovascular: Negative.   Gastrointestinal: Negative.   Genitourinary: Negative.   Musculoskeletal: Negative.  Negative for joint swelling.  Skin: Negative.   Neurological: Positive for numbness. Negative for weakness.  Hematological: Negative.   Psychiatric/Behavioral: Negative.     Allergies  Review of patient's allergies indicates no known allergies.  Home Medications   Current Outpatient Rx  Name Route Sig Dispense Refill  . 5-HTP 100 MG PO CAPS Oral Take 1 capsule by mouth at bedtime.      . BUPROPION HCL ER (XL) 300 MG PO TB24 Oral Take 300 mg by mouth daily.      Marland Kitchen CLONAZEPAM 2 MG PO TABS Oral Take 2 mg by mouth at bedtime.      . CYCLOBENZAPRINE HCL 10 MG PO TABS Oral Take 10 mg by mouth at bedtime.      . DEXLANSOPRAZOLE 60 MG PO CPDR Oral Take 60 mg by mouth daily.      Marland Kitchen HYDROCODONE-ACETAMINOPHEN 7.5-750 MG PO TABS Oral Take 1 tablet by mouth daily as needed.      Marland Kitchen MELATONIN PO Oral Take 1 capsule by mouth at bedtime.      Marland Kitchen PROMETHAZINE HCL 25  MG PO TABS Oral Take 25 mg by mouth daily as needed. For nausea    . TRAMADOL HCL 50 MG PO TABS Oral Take 50 mg by mouth every 6 (six) hours as needed. For pain      BP 127/67  Pulse 100  Temp(Src) 98.3 F (36.8 C) (Oral)  Resp 18  SpO2 98%  Physical Exam  Constitutional: She is oriented to person, place, and time. She appears well-developed and well-nourished.  HENT:  Head: Normocephalic and atraumatic.  Eyes: Conjunctivae are normal.  Neck: Neck supple.  Cardiovascular: Normal rate and regular rhythm.   Pulmonary/Chest: Effort normal and breath sounds normal.  Abdominal: Soft. Bowel sounds are normal.  Musculoskeletal: Normal range of motion.       No significant TTP to right hip or right knee. Right knee does not appear swollen, erythematous or have any other concerning objective signs concerning for  infection/inflammation. Patient is able to flex and extend her leg.  Neurological: She is alert and oriented to person, place, and time.  Skin: Skin is warm and dry. No erythema.  Psychiatric: She has a normal mood and affect.    ED Course  Procedures   Patient reports significant relief of pain with IM medication. Will plan to discharge home on prednisone and encourage patient to continue her hydrocodone/A.PAP for pain as previously directed by Dr. Montez Morita. Patient is to follow up with Dr. Montez Morita this Thursday as planned  Labs Reviewed - No data to display No results found.   No diagnosis found.    MDM  HPI/PE and clinical findings c/w 1. Chronic (R) knee pain 2. Chronic (R) hip pain   Patient denies injury. Is under care of Dr. Myrtie Neither for this ongoing pain. No signs and symptoms worrisome for septic joint or other infectious/inflammatory process.        Leanne Chang, NP 04/11/11 [phone]

## 2011-04-11 NOTE — ED Notes (Signed)
The pt has  Rt hip and rt knee pain all day.  She has this chronically and can usually take a decadron tablet and it gets better .  Tonight she is still in pain

## 2011-04-23 ENCOUNTER — Ambulatory Visit
Admission: RE | Admit: 2011-04-23 | Discharge: 2011-04-23 | Disposition: A | Discharge status: Home / Self Care | Payer: BC Managed Care – PPO | Source: Ambulatory Visit | Attending: Orthopedic Surgery | Admitting: Orthopedic Surgery

## 2011-04-23 ENCOUNTER — Other Ambulatory Visit: Payer: Self-pay | Admitting: Orthopedic Surgery

## 2011-04-23 DIAGNOSIS — M659 Synovitis and tenosynovitis, unspecified: Secondary | ICD-10-CM

## 2011-04-23 DIAGNOSIS — M5430 Sciatica, unspecified side: Secondary | ICD-10-CM

## 2011-04-27 ENCOUNTER — Other Ambulatory Visit: Payer: Self-pay | Admitting: Hematology & Oncology

## 2011-04-27 DIAGNOSIS — Z1231 Encounter for screening mammogram for malignant neoplasm of breast: Secondary | ICD-10-CM

## 2011-05-18 ENCOUNTER — Ambulatory Visit: Payer: BC Managed Care – PPO

## 2011-05-19 ENCOUNTER — Ambulatory Visit
Admission: RE | Admit: 2011-05-19 | Discharge: 2011-05-19 | Disposition: A | Discharge status: Home / Self Care | Payer: BC Managed Care – PPO | Source: Ambulatory Visit | Attending: Hematology & Oncology | Admitting: Hematology & Oncology

## 2011-05-19 DIAGNOSIS — Z1231 Encounter for screening mammogram for malignant neoplasm of breast: Secondary | ICD-10-CM

## 2011-10-28 ENCOUNTER — Telehealth: Payer: Self-pay | Admitting: Hematology & Oncology

## 2011-10-28 NOTE — Telephone Encounter (Signed)
Patient called and sch her yearly appointment for 11/12/11

## 2011-11-12 ENCOUNTER — Other Ambulatory Visit (HOSPITAL_BASED_OUTPATIENT_CLINIC_OR_DEPARTMENT_OTHER): Payer: BC Managed Care – PPO | Admitting: Lab

## 2011-11-12 ENCOUNTER — Ambulatory Visit (HOSPITAL_BASED_OUTPATIENT_CLINIC_OR_DEPARTMENT_OTHER): Payer: BC Managed Care – PPO | Admitting: Hematology & Oncology

## 2011-11-12 VITALS — BP 131/73 | HR 102 | Temp 98.1°F | Resp 16 | Ht 62.0 in | Wt 157.0 lb

## 2011-11-12 DIAGNOSIS — Z853 Personal history of malignant neoplasm of breast: Secondary | ICD-10-CM

## 2011-11-12 DIAGNOSIS — M810 Age-related osteoporosis without current pathological fracture: Secondary | ICD-10-CM

## 2011-11-12 DIAGNOSIS — C50919 Malignant neoplasm of unspecified site of unspecified female breast: Secondary | ICD-10-CM

## 2011-11-12 LAB — CBC WITH DIFFERENTIAL (CANCER CENTER ONLY)
BASO#: 0 10*3/uL (ref 0.0–0.2)
BASO%: 0.4 % (ref 0.0–2.0)
EOS%: 2.2 % (ref 0.0–7.0)
Eosinophils Absolute: 0.2 10*3/uL (ref 0.0–0.5)
HCT: 36.6 % (ref 34.8–46.6)
HGB: 12.2 g/dL (ref 11.6–15.9)
LYMPH#: 4 10*3/uL — ABNORMAL HIGH (ref 0.9–3.3)
LYMPH%: 48.3 % — ABNORMAL HIGH (ref 14.0–48.0)
MCH: 27.5 pg (ref 26.0–34.0)
MCHC: 33.3 g/dL (ref 32.0–36.0)
MCV: 82 fL (ref 81–101)
MONO#: 0.5 10*3/uL (ref 0.1–0.9)
MONO%: 6.1 % (ref 0.0–13.0)
NEUT#: 3.5 10*3/uL (ref 1.5–6.5)
NEUT%: 43 % (ref 39.6–80.0)
Platelets: 349 10*3/uL (ref 145–400)
RBC: 4.44 10*6/uL (ref 3.70–5.32)
RDW: 14.9 % (ref 11.1–15.7)
WBC: 8.2 10*3/uL (ref 3.9–10.0)

## 2011-11-12 NOTE — Progress Notes (Signed)
This office note has been dictated.

## 2011-11-13 LAB — COMPREHENSIVE METABOLIC PANEL
ALT: 30 U/L (ref 0–35)
AST: 23 U/L (ref 0–37)
Albumin: 4.4 g/dL (ref 3.5–5.2)
Alkaline Phosphatase: 83 U/L (ref 39–117)
BUN: 13 mg/dL (ref 6–23)
CO2: 30 mEq/L (ref 19–32)
Calcium: 10.2 mg/dL (ref 8.4–10.5)
Chloride: 102 mEq/L (ref 96–112)
Creatinine, Ser: 0.69 mg/dL (ref 0.50–1.10)
Glucose, Bld: 75 mg/dL (ref 70–99)
Potassium: 4 mEq/L (ref 3.5–5.3)
Sodium: 140 mEq/L (ref 135–145)
Total Bilirubin: 0.3 mg/dL (ref 0.3–1.2)
Total Protein: 7.1 g/dL (ref 6.0–8.3)

## 2011-11-13 LAB — VITAMIN D 25 HYDROXY (VIT D DEFICIENCY, FRACTURES): Vit D, 25-Hydroxy: 37 ng/mL (ref 30–89)

## 2011-11-13 NOTE — Progress Notes (Signed)
CC:   Linda [name], M.D.  DIAGNOSIS:  Stage IIA (T2 N0 M0) ductal carcinoma of the left breast, triple-negative.  CURRENT THERAPY:  Observation.  INTERIM HISTORY:  Linda [name] comes in for her yearly follow-up.  As always, she has been doing well.  She has had a good year since we last saw her.  There has been no change in her medications.  She is still working.  She has had no problems with nausea or vomiting.  She has had no bony pain.  There has been no change in bowel or bladder habits.  Her last mammogram was done back in May.  The mammogram looked okay.  She has had no problems with fevers, sweats, or chills.  She is taking aspirin and vitamin D.  PHYSICAL EXAMINATION:  General:  This is a well-developed, well- nourished black female in no obvious distress.  Vital Signs 98.1, pulse 102, respiratory rate 18, blood pressure 131/73.  Weight is 157.  Head and neck:  Normocephalic, atraumatic skull.  There are no ocular or oral lesions.  There are no palpable cervical or supraclavicular lymph nodes. Lungs:  Clear bilaterally.  Cardiac:  Regular rate and rhythm with a normal S1 and S2.  There are no murmurs, rubs, or bruits.  Breasts: Right breast shows no masses, edema, or erythema.  There is no right axillary adenopathy.  Left breast shows well-healed lumpectomy at the 2 o'clock position.  There is some slight contraction of the left breast from surgery and radiation.  No distinct mass is noted in the left breast.  There is no left axillary adenopathy.  Abdomen:  Soft with good bowel sounds.  There is no palpable abdominal mass.  There is no palpable hepatosplenomegaly.  Back:  No tenderness over the spine, ribs, or hips.  Neurologic:  No focal neurological deficits.  LABORATORY STUDIES:  White cell count of 8.2, hemoglobin 12.2, hematocrit 36.6, platelet count 349.  IMPRESSION:  Linda [name] is a 61 year old African American female with stage IIA-node negative-ductal carcinoma of  the left breast.  She is triple-negative.  She underwent systemic chemotherapy with Adriamycin/Cytoxan that was completed back in July 2002.  She had radiation therapy.  There was no indication for hormonal therapy.  Again, I do not see any evidence of recurrent disease.  Will go ahead and plan to get her back in 1 year.  I do not see that we need any blood work in between visits.    ______________________________ Josph Macho, M.D. PRE/MEDQ  D:  11/12/2011  T:  11/13/2011  Job:  [id number]

## 2011-11-16 ENCOUNTER — Telehealth: Payer: Self-pay | Admitting: *Deleted

## 2011-11-16 NOTE — Telephone Encounter (Addendum)
Message copied by Mirian Capuchin on Mon Nov 16, 2011 11:14 AM ------      Message from: Josph Macho      Created: Fri Nov 13, 2011  5:23 PM       Call - labs are normal.  Cindee Lame This message left on pt's home phone answering machine.

## 2011-12-08 ENCOUNTER — Other Ambulatory Visit: Payer: Self-pay | Admitting: *Deleted

## 2011-12-08 NOTE — Telephone Encounter (Signed)
error 

## 2012-04-23 ENCOUNTER — Observation Stay (HOSPITAL_COMMUNITY)
Admission: EM | Admit: 2012-04-23 | Discharge: 2012-04-24 | Disposition: A | Discharge status: Home / Self Care | Payer: BC Managed Care – PPO | Attending: Internal Medicine | Admitting: Internal Medicine

## 2012-04-23 ENCOUNTER — Encounter (HOSPITAL_COMMUNITY): Payer: Self-pay | Admitting: Emergency Medicine

## 2012-04-23 ENCOUNTER — Emergency Department (HOSPITAL_COMMUNITY): Payer: BC Managed Care – PPO

## 2012-04-23 DIAGNOSIS — Z8249 Family history of ischemic heart disease and other diseases of the circulatory system: Secondary | ICD-10-CM | POA: Insufficient documentation

## 2012-04-23 DIAGNOSIS — Z87891 Personal history of nicotine dependence: Secondary | ICD-10-CM | POA: Insufficient documentation

## 2012-04-23 DIAGNOSIS — Z853 Personal history of malignant neoplasm of breast: Secondary | ICD-10-CM | POA: Insufficient documentation

## 2012-04-23 DIAGNOSIS — F5104 Psychophysiologic insomnia: Secondary | ICD-10-CM | POA: Diagnosis present

## 2012-04-23 DIAGNOSIS — E785 Hyperlipidemia, unspecified: Secondary | ICD-10-CM | POA: Diagnosis present

## 2012-04-23 DIAGNOSIS — R0789 Other chest pain: Principal | ICD-10-CM | POA: Insufficient documentation

## 2012-04-23 DIAGNOSIS — R079 Chest pain, unspecified: Secondary | ICD-10-CM | POA: Diagnosis present

## 2012-04-23 DIAGNOSIS — G4700 Insomnia, unspecified: Secondary | ICD-10-CM | POA: Insufficient documentation

## 2012-04-23 DIAGNOSIS — K219 Gastro-esophageal reflux disease without esophagitis: Secondary | ICD-10-CM

## 2012-04-23 DIAGNOSIS — I10 Essential (primary) hypertension: Secondary | ICD-10-CM

## 2012-04-23 DIAGNOSIS — M797 Fibromyalgia: Secondary | ICD-10-CM | POA: Diagnosis present

## 2012-04-23 DIAGNOSIS — R072 Precordial pain: Secondary | ICD-10-CM | POA: Insufficient documentation

## 2012-04-23 DIAGNOSIS — D649 Anemia, unspecified: Secondary | ICD-10-CM | POA: Insufficient documentation

## 2012-04-23 DIAGNOSIS — IMO0001 Reserved for inherently not codable concepts without codable children: Secondary | ICD-10-CM | POA: Insufficient documentation

## 2012-04-23 LAB — CBC
HCT: 34.3 % — ABNORMAL LOW (ref 36.0–46.0)
Hemoglobin: 11.7 g/dL — ABNORMAL LOW (ref 12.0–15.0)
MCH: 27.7 pg (ref 26.0–34.0)
MCHC: 34.1 g/dL (ref 30.0–36.0)
MCV: 81.3 fL (ref 78.0–100.0)
Platelets: 316 10*3/uL (ref 150–400)
RBC: 4.22 MIL/uL (ref 3.87–5.11)
RDW: 14.4 % (ref 11.5–15.5)
WBC: 9.7 10*3/uL (ref 4.0–10.5)

## 2012-04-23 LAB — TSH: TSH: 2.208 u[IU]/mL (ref 0.350–4.500)

## 2012-04-23 LAB — MAGNESIUM: Magnesium: 2.1 mg/dL (ref 1.5–2.5)

## 2012-04-23 LAB — PROTIME-INR
INR: 0.98 (ref 0.00–1.49)
Prothrombin Time: 12.9 seconds (ref 11.6–15.2)

## 2012-04-23 LAB — COMPREHENSIVE METABOLIC PANEL
ALT: 21 U/L (ref 0–35)
AST: 18 U/L (ref 0–37)
Albumin: 3.9 g/dL (ref 3.5–5.2)
Alkaline Phosphatase: 68 U/L (ref 39–117)
BUN: 12 mg/dL (ref 6–23)
CO2: 28 mEq/L (ref 19–32)
Calcium: 9.8 mg/dL (ref 8.4–10.5)
Chloride: 101 mEq/L (ref 96–112)
Creatinine, Ser: 0.71 mg/dL (ref 0.50–1.10)
GFR calc Af Amer: >90 mL/min (ref >90)
GFR calc non Af Amer: >90 mL/min (ref >90)
Glucose, Bld: 159 mg/dL — ABNORMAL HIGH (ref 70–99)
Potassium: 3.7 mEq/L (ref 3.5–5.1)
Sodium: 138 mEq/L (ref 135–145)
Total Bilirubin: 0.1 mg/dL — ABNORMAL LOW (ref 0.3–1.2)
Total Protein: 7.4 g/dL (ref 6.0–8.3)

## 2012-04-23 LAB — FERRITIN: Ferritin: 25 ng/mL (ref 10–291)

## 2012-04-23 LAB — POCT I-STAT TROPONIN I: Troponin i, poc: 0 ng/mL (ref 0.00–0.08)

## 2012-04-23 LAB — RETICULOCYTES
RBC.: 4.16 MIL/uL (ref 3.87–5.11)
Retic Count, Absolute: 54.1 10*3/uL (ref 19.0–186.0)
Retic Ct Pct: 1.3 % (ref 0.4–3.1)

## 2012-04-23 LAB — D-DIMER, QUANTITATIVE: D-Dimer, Quant: <0.27 ug/mL-FEU (ref 0.00–0.48)

## 2012-04-23 LAB — FOLATE: Folate: >20 ng/mL

## 2012-04-23 LAB — LIPID PANEL
Cholesterol: 260 mg/dL — ABNORMAL HIGH (ref 0–200)
HDL: 75 mg/dL (ref >39)
LDL Cholesterol: 159 mg/dL — ABNORMAL HIGH (ref 0–99)
Total CHOL/HDL Ratio: 3.5 RATIO
Triglycerides: 129 mg/dL (ref <150)
VLDL: 26 mg/dL (ref 0–40)

## 2012-04-23 LAB — VITAMIN B12: Vitamin B-12: 362 pg/mL (ref 211–911)

## 2012-04-23 LAB — APTT: aPTT: 31 seconds (ref 24–37)

## 2012-04-23 LAB — TROPONIN I
Troponin I: <0.3 ng/mL (ref <0.30)
Troponin I: <0.3 ng/mL (ref <0.30)

## 2012-04-23 LAB — IRON AND TIBC
Iron: 64 ug/dL (ref 42–135)
Saturation Ratios: 17 % — ABNORMAL LOW (ref 20–55)
TIBC: 372 ug/dL (ref 250–470)
UIBC: 308 ug/dL (ref 125–400)

## 2012-04-23 MED ORDER — ENOXAPARIN SODIUM 40 MG/0.4ML ~~LOC~~ SOLN
40.0000 mg | SUBCUTANEOUS | Status: DC
  Administered 2012-04-23: 40 mg via SUBCUTANEOUS
  Filled 2012-04-23 (×2): qty 0.4

## 2012-04-23 MED ORDER — METOPROLOL TARTRATE 1 MG/ML IV SOLN
5.0000 mg | INTRAVENOUS | Status: AC | PRN
  Administered 2012-04-23 (×3): 5 mg via INTRAVENOUS
  Filled 2012-04-23: qty 15

## 2012-04-23 MED ORDER — ALUM & MAG HYDROXIDE-SIMETH 200-200-20 MG/5ML PO SUSP: 15.0000 mL | ORAL | Status: DC | PRN

## 2012-04-23 MED ORDER — SODIUM CHLORIDE 0.9 % IJ SOLN
3.0000 mL | Freq: Two times a day (BID) | INTRAMUSCULAR | Status: DC
  Administered 2012-04-23 (×2): 3 mL via INTRAVENOUS

## 2012-04-23 MED ORDER — BACLOFEN 10 MG PO TABS
10.0000 mg | ORAL_TABLET | Freq: Two times a day (BID) | ORAL | Status: DC
  Administered 2012-04-23 – 2012-04-24 (×2): 10 mg via ORAL
  Filled 2012-04-23 (×4): qty 1

## 2012-04-23 MED ORDER — HYDROCODONE-ACETAMINOPHEN 5-325 MG PO TABS
2.0000 | ORAL_TABLET | Freq: Once | ORAL | Status: AC
  Administered 2012-04-23: 2 via ORAL
  Filled 2012-04-23: qty 2

## 2012-04-23 MED ORDER — DOXEPIN HCL 10 MG PO CAPS
10.0000 mg | ORAL_CAPSULE | Freq: Every day | ORAL | Status: DC
  Administered 2012-04-23: 10 mg via ORAL
  Filled 2012-04-23 (×2): qty 1

## 2012-04-23 MED ORDER — ASPIRIN 81 MG PO CHEW
324.0000 mg | CHEWABLE_TABLET | Freq: Once | ORAL | Status: AC
  Administered 2012-04-23: 324 mg via ORAL
  Filled 2012-04-23: qty 4

## 2012-04-23 MED ORDER — CYCLOBENZAPRINE HCL 10 MG PO TABS
10.0000 mg | ORAL_TABLET | Freq: Every day | ORAL | Status: DC
Start: 2012-04-23 — End: 2012-04-24
  Administered 2012-04-23: 10 mg via ORAL
  Filled 2012-04-23 (×2): qty 1

## 2012-04-23 MED ORDER — PANTOPRAZOLE SODIUM 40 MG PO TBEC
40.0000 mg | DELAYED_RELEASE_TABLET | Freq: Every day | ORAL | Status: DC
  Administered 2012-04-23 – 2012-04-24 (×2): 40 mg via ORAL
  Filled 2012-04-23: qty 1

## 2012-04-23 MED ORDER — HYDROCODONE-ACETAMINOPHEN 5-325 MG PO TABS
1.5000 | ORAL_TABLET | Freq: Four times a day (QID) | ORAL | Status: DC | PRN
  Administered 2012-04-23 – 2012-04-24 (×4): 1.5 via ORAL
  Filled 2012-04-23 (×4): qty 2

## 2012-04-23 MED ORDER — 5-HTP 100 MG PO CAPS: 1.0000 | ORAL_CAPSULE | Freq: Every day | ORAL | Status: DC

## 2012-04-23 MED ORDER — NITROGLYCERIN 2 % TD OINT
1.0000 [in_us] | TOPICAL_OINTMENT | Freq: Once | TRANSDERMAL | Status: AC
  Administered 2012-04-23: 1 [in_us] via TOPICAL
  Filled 2012-04-23: qty 1

## 2012-04-23 MED ORDER — ASPIRIN EC 325 MG PO TBEC
325.0000 mg | DELAYED_RELEASE_TABLET | Freq: Every day | ORAL | Status: DC
  Administered 2012-04-23 – 2012-04-24 (×2): 325 mg via ORAL
  Filled 2012-04-23 (×2): qty 1

## 2012-04-23 MED ORDER — BUPROPION HCL ER (XL) 300 MG PO TB24
300.0000 mg | ORAL_TABLET | Freq: Every day | ORAL | Status: DC
  Administered 2012-04-23 – 2012-04-24 (×2): 300 mg via ORAL
  Filled 2012-04-23 (×2): qty 1

## 2012-04-23 NOTE — Progress Notes (Signed)
PHARMACIST - PHYSICIAN ORDER COMMUNICATION  CONCERNING: P&T Medication Policy on Herbal Medications  DESCRIPTION:  This patient's order for:  5-HTP  has been noted.  This product(s) is classified as an "herbal" or natural product. Due to a lack of definitive safety studies or FDA approval, nonstandard manufacturing practices, plus the potential risk of unknown drug-drug interactions while on inpatient medications, the Pharmacy and Therapeutics Committee does not permit the use of "herbal" or natural products of this type within [place].   ACTION TAKEN: The pharmacy department is unable to verify this order at this time and your patient has been informed of this safety policy. Please reevaluate patient's clinical condition at discharge and address if the herbal or natural product(s) should be resumed at that time.   

## 2012-04-23 NOTE — Progress Notes (Signed)
UR completed 

## 2012-04-23 NOTE — Consult Note (Signed)
Admit date: 04/23/2012 Referring Physician  Dr. Lavera Guise Primary Physician  Dr. Parke Simmers Primary Cardiologist  NONE Reason for Consultation  CP  HPI: Linda [name] is a 62 y.o. female with hx of gerd and fibromyalgia, who came to the ED last night c/o moderate to severe new onset retrosternal chest pain , flet like squeezing pressure, not brought on by effort, no radiation. Not relieved by nitroglycerin. She has a strong family history of CAD and personal history of tobacco use many years ago. No N,V, epigastric pain .  Cardiac enzymes thus far are negative.  She still complains of mild chest tightness in the retrosternal area which is somewhat made worse by deep breathing.  We are now asked to consult for further evaluation of chest pai.    PMH:   Past Medical History  Diagnosis Date  . Hyperlipidemia   . Fibromyalgia   . Allergic rhinitis   . Breast cancer 2002    left     PSH:   Past Surgical History  Procedure Laterality Date  . Cholecystectomy  1989  . Vaginal hysterectomy  1987  . Appendectomy  1989  . Breast lumpectomy  2002    left    Allergies:  Review of patient's allergies indicates no known allergies. Prior to Admit Meds:   Prescriptions prior to admission  Medication Sig Dispense Refill  . 5-Hydroxytryptophan (5-HTP) 100 MG CAPS Take 1 capsule by mouth at bedtime.        . baclofen (LIORESAL) 10 MG tablet Take 10 mg by mouth 2 (two) times daily.       Marland Kitchen buPROPion (WELLBUTRIN XL) 300 MG 24 hr tablet Take 300 mg by mouth daily.        . cyclobenzaprine (FLEXERIL) 10 MG tablet Take 10 mg by mouth at bedtime.        Marland Kitchen dexlansoprazole (DEXILANT) 60 MG capsule Take 60 mg by mouth daily.        Marland Kitchen doxepin (SINEQUAN) 10 MG capsule Take 10 mg by mouth at bedtime.       Marland Kitchen HYDROcodone-acetaminophen (VICODIN ES) 7.5-750 MG per tablet Take 1 tablet by mouth daily as needed for pain.       . Nutritional Supplements (MELATONIN PO) Take 1 capsule by mouth at bedtime.        . promethazine  (PHENERGAN) 25 MG tablet Take 25 mg by mouth daily as needed. For nausea      . traMADol (ULTRAM) 50 MG tablet Take 50 mg by mouth every 6 (six) hours as needed. For pain       Fam HX:    Family History  Problem Relation Age of Onset  . Lung cancer Father   . Breast cancer Maternal Aunt   . CAD Mother    Social HX:    History   Social History  . Marital Status: Married    Spouse Name: N/A    Number of Children: N/A  . Years of Education: N/A   Occupational History  . Not on file.   Social History Main Topics  . Smoking status: Former Smoker -- 1.50 packs/day for 20 years    Types: Cigarettes    Quit date: 01/27/1988  . Smokeless tobacco: Never Used  . Alcohol Use: No  . Drug Use: No  . Sexually Active: Not on file   Other Topics Concern  . Not on file   Social History Narrative  . No narrative on file     ROS:  All 11 ROS were addressed and are negative except what is stated in the HPI  Physical Exam: Blood pressure 138/70, pulse 83, temperature 98 F (36.7 C), temperature source Oral, resp. rate 18, height 5\' 2"  (1.575 m), weight 70.943 kg (156 lb 6.4 oz), SpO2 99.00%.    General: Well developed, well nourished, in no acute distress Head: Eyes PERRLA, No xanthomas.   Normal cephalic and atramatic  Lungs:   Clear bilaterally to auscultation and percussion. Heart:   HRRR S1 S2 Pulses are 2+ & equal.            No carotid bruit. No JVD.  No abdominal bruits. No femoral bruits. Abdomen: Bowel sounds are positive, abdomen soft and non-tender without masses Extremities:   No clubbing, cyanosis or edema.  DP +1 Neuro: Alert and oriented X 3. Psych:  Good affect, responds appropriately    Labs:   Lab Results  Component Value Date   WBC 9.7 04/23/2012   HGB 11.7* 04/23/2012   HCT 34.3* 04/23/2012   MCV 81.3 04/23/2012   PLT 316 04/23/2012    Recent Labs Lab 04/23/12 0255  NA 138  K 3.7  CL 101  CO2 28  BUN 12  CREATININE 0.71  CALCIUM 9.8  PROT 7.4   BILITOT 0.1*  ALKPHOS 68  ALT 21  AST 18  GLUCOSE 159*   No results found for this basename: PTT   Lab Results  Component Value Date   INR 0.98 04/23/2012   Lab Results  Component Value Date   TROPONINI <0.30 04/23/2012     Lab Results  Component Value Date   CHOL 260* 04/23/2012   Lab Results  Component Value Date   HDL 75 04/23/2012   Lab Results  Component Value Date   LDLCALC 159* 04/23/2012   Lab Results  Component Value Date   TRIG 129 04/23/2012   Lab Results  Component Value Date   CHOLHDL 3.5 04/23/2012   No results found for this basename: LDLDIRECT      Radiology:  Dg Chest 2 View  04/23/2012  *RADIOLOGY REPORT*  Clinical Data: Chest pain.  CHEST - 2 VIEW  Comparison: Chest x-ray 01/12/2007.  Findings: Lung volumes are normal.  No consolidative airspace disease.  No pleural effusions.  No pneumothorax.  No pulmonary nodule or mass noted.  Pulmonary vasculature and the cardiomediastinal silhouette are within normal limits. Atherosclerosis in the thoracic aorta.  Left apical pleuroparenchymal scarring which appears partially calcified, unchanged compared to prior examination.  Surgical clips project over the right quadrant of the abdomen, compatible with prior cholecystectomy.  IMPRESSION: 1. No radiographic evidence of acute cardiopulmonary disease. 2.  Atherosclerosis. 3.  Status post cholecystectomy.   Original Report Authenticated By: Trudie Reed, M.D.     EKG:  NSR with no ST changes  ASSESSMENT:  1.  Atyipcal chest pain but with cardiac risk factors including post menopausal state, remote tobacco use, dyslipidemia and family history of CAD.  EKG is normal and cardiac enzymes thus far are normal.   2.  Fibromyalgia 3.  Hyperlipidemia 4.  Breast CA  PLAN:   1.  NPO after midnight 2.  Nuclear stress test in am  Quintella Reichert, MD  04/23/2012  11:09 AM

## 2012-04-23 NOTE — ED Provider Notes (Signed)
History     CSN: [id number]  Arrival date & time 04/23/12  0250   First MD Initiated Contact with Patient 04/23/12 0309      Chief Complaint  Patient presents with  . Chest Pain    (Consider location/radiation/quality/duration/timing/severity/associated sxs/prior treatment) HPI Linda [name] is a 62 yo woman with hyperlipidemia, fibromyalgia and history of breast cancer. She presents with complaints of chest pain.  Pain began around 1am as patient was sitting watching TV. Chest feels tight and burning on the left side. Non radiating. 4/10 at worst and at present. Pt denies any associated SOB but says she felt lightheaded PTA and has had some nausea. No diaphoresis.   No history of similar sx and has never had a cardiac work up. RF for CAD include positive FH (brother with MI at age 75 and another brother with MI at 76 yo), hyperlipidemia and 20 pack year smoking history (now non-smoker).   Patient has no VTE RF.    Past Medical History  Diagnosis Date  . Hyperlipidemia   . Breast cancer 2002    left  . Fibromyalgia   . Allergic rhinitis     Past Surgical History  Procedure Laterality Date  . Cholecystectomy  1989  . Vaginal hysterectomy  1987  . Appendectomy  1989  . Breast lumpectomy  2002    left    Family History  Problem Relation Age of Onset  . Lung cancer Father   . Breast cancer Maternal Aunt     History  Substance Use Topics  . Smoking status: Former Smoker -- 1.50 packs/day for 20 years    Types: Cigarettes    Quit date: 01/27/1988  . Smokeless tobacco: Never Used  . Alcohol Use: No    OB History   Grav Para Term Preterm Abortions TAB SAB Ect Mult Living                  Review of Systems Gen: no weight loss, fevers, chills, night sweats Eyes: no discharge or drainage, no occular pain or visual changes Nose: no epistaxis or rhinorrhea Mouth: no dental pain, no sore throat Neck: no neck pain Lungs: no SOB, cough, wheezing CV: As per history of  present illness, otherwise negative Abd: no abdominal pain, nausea, vomiting GU: no dysuria or gross hematuria MSK: Chronic myalgias and arthralgias secondary to fibromyalgia, per patient. Neuro: no headache, no focal neurologic deficits Skin: no rash Psyche: negative.  Allergies  Review of patient's allergies indicates no known allergies.  Home Medications   Current Outpatient Rx  Name  Route  Sig  Dispense  Refill  . 5-Hydroxytryptophan (5-HTP) 100 MG CAPS   Oral   Take 1 capsule by mouth at bedtime.           . baclofen (LIORESAL) 10 MG tablet      10 mg 2 (two) times daily.          Marland Kitchen buPROPion (WELLBUTRIN XL) 300 MG 24 hr tablet   Oral   Take 300 mg by mouth daily.           . clonazePAM (KLONOPIN) 2 MG tablet   Oral   Take 2 mg by mouth at bedtime.           . cyclobenzaprine (FLEXERIL) 10 MG tablet   Oral   Take 10 mg by mouth at bedtime.           Marland Kitchen dexlansoprazole (DEXILANT) 60 MG capsule   Oral  Take 60 mg by mouth daily.           Marland Kitchen doxepin (SINEQUAN) 10 MG capsule      10 mg at bedtime.          Marland Kitchen HYDROcodone-acetaminophen (VICODIN ES) 7.5-750 MG per tablet   Oral   Take 1 tablet by mouth daily as needed.           . Nutritional Supplements (MELATONIN PO)   Oral   Take 1 capsule by mouth at bedtime.           . promethazine (PHENERGAN) 25 MG tablet   Oral   Take 25 mg by mouth daily as needed. For nausea         . traMADol (ULTRAM) 50 MG tablet   Oral   Take 50 mg by mouth every 6 (six) hours as needed. For pain           BP 156/69  Pulse 110  Temp(Src) 98.1 F (36.7 C) (Oral)  Resp 20  SpO2 98%  Physical Exam Gen: well developed and well nourished appearing Head: NCAT Eyes: PERL, EOMI Nose: no epistaixis or rhinorrhea Mouth/throat: mucosa is moist and pink Neck: supple, no stridor Lungs: CTA B, no wheezing, rhonchi or rales CV: rapid and regular, pulse approx 108/min, no murmur, good distal pulses.  Abd:  soft, notender, nondistended Back: no ttp, no cva ttp Skin: no rashese, wnl Neuro: CN ii-xii grossly intact, no focal deficits Psyche; flat affect,  calm and cooperative.   ED Course  Procedures (including critical care time) Results for orders placed during the hospital encounter of 04/23/12 (from the past 24 hour(s))  CBC     Status: Abnormal   Collection Time    04/23/12  2:55 AM      Result Value Range   WBC 9.7  4.0 - 10.5 K/uL   RBC 4.22  3.87 - 5.11 MIL/uL   Hemoglobin 11.7 (*) 12.0 - 15.0 g/dL   HCT 16.1 (*) 09.6 - 04.5 %   MCV 81.3  78.0 - 100.0 fL   MCH 27.7  26.0 - 34.0 pg   MCHC 34.1  30.0 - 36.0 g/dL   RDW 40.9  81.1 - 91.4 %   Platelets 316  150 - 400 K/uL  PROTIME-INR     Status: None   Collection Time    04/23/12  2:55 AM      Result Value Range   Prothrombin Time 12.9  11.6 - 15.2 seconds   INR 0.98  0.00 - 1.49  APTT     Status: None   Collection Time    04/23/12  2:55 AM      Result Value Range   aPTT 31  24 - 37 seconds  POCT I-STAT TROPONIN I     Status: None   Collection Time    04/23/12  3:08 AM      Result Value Range   Troponin i, poc 0.00  0.00 - 0.08 ng/mL   Comment 3            EKG: sinus tach, pulse 104, normal axis, normal QRS complex, no signs of acute ischemia.   Dg Chest 2 View  04/23/2012  *RADIOLOGY REPORT*  Clinical Data: Chest pain.  CHEST - 2 VIEW  Comparison: Chest x-ray 01/12/2007.  Findings: Lung volumes are normal.  No consolidative airspace disease.  No pleural effusions.  No pneumothorax.  No pulmonary nodule or mass noted.  Pulmonary vasculature and  the cardiomediastinal silhouette are within normal limits. Atherosclerosis in the thoracic aorta.  Left apical pleuroparenchymal scarring which appears partially calcified, unchanged compared to prior examination.  Surgical clips project over the right quadrant of the abdomen, compatible with prior cholecystectomy.  IMPRESSION: 1. No radiographic evidence of acute cardiopulmonary  disease. 2.  Atherosclerosis. 3.  Status post cholecystectomy.   Original Report Authenticated By: Trudie Reed, M.D.      DDX: ACS, pneumothorax, pneumonia, pericardial or pleural effusion, gastritis, GERD/PUD, musculoskeletal pain.     MDM  ED work up is thus far nondiagnostic with sinus tachycardia on EKG and negative first troponin. Chest x-ray is unremarkable. We will rule out PE with a d-dimer on this patient. She has multiple coronary artery disease risk factors. She is being empirically treated with aspirin and nitroglycerin along with metoprolol. [facility] paged to request admission.        Brandt Loosen, MD 04/23/12 [phone]

## 2012-04-23 NOTE — ED Notes (Signed)
Pt. C/o left chest pain starting at 0100 this morning. Pt. Denies radiation.

## 2012-04-23 NOTE — ED Notes (Signed)
C/o L sided chest tightness since 11pm.  Also reports sob, nausea, dizziness, and bilateral leg weakness.

## 2012-04-23 NOTE — ED Notes (Signed)
Dr Manly at bedside 

## 2012-04-23 NOTE — H&P (Signed)
[facility] History and Physical  Linda [name] WJX:[id number] DOB: 04-Oct-1950 DOA: 04/23/2012  Referring physician: ED PCP: Geraldo Pitter, MD    Chief Complaint:  Chief Complaint  Patient presents with  . Chest Pain     HPI: Linda [name] is a 62 y.o. female with hx of gerd and fibromyalgia, who came to the ED last night c/o moderate to severe new onset retrosternal chest pain , flet like squeezing pressure, not brought on by effort, no radiation. Not relieved by nitroglycerin. She has a strong family history of CAD and personal history of tobacco use many years ago. No N,V, epigastric pain    Review of Systems: The patient denies anorexia, fever, weight loss,, vision loss, decreased hearing, hoarseness, syncope, dyspnea on exertion, peripheral edema, balance deficits, hemoptysis, abdominal pain, melena, hematochezia, severe indigestion/heartburn, hematuria, incontinence, genital sores, muscle weakness, suspicious skin lesions, transient blindness, difficulty walking, depression, unusual weight change, abnormal bleeding, enlarged lymph nodes, angioedema, and breast masses.    Past Medical History  Diagnosis Date  . Hyperlipidemia   . Fibromyalgia   . Allergic rhinitis   . Breast cancer 2002    left   Past Surgical History  Procedure Laterality Date  . Cholecystectomy  1989  . Vaginal hysterectomy  1987  . Appendectomy  1989  . Breast lumpectomy  2002    left   Social History:  reports that she quit smoking about 24 years ago. Her smoking use included Cigarettes. She has a 30 pack-year smoking history. She has never used smokeless tobacco. She reports that she does not drink alcohol or use illicit drugs. Lives at home independently with husband   No Known Allergies  Family History  Problem Relation Age of Onset  . Lung cancer Father   . Breast cancer Maternal Aunt   . CAD Mother      Prior to Admission medications   Medication Sig Start Date End Date Taking?  Authorizing Provider  5-Hydroxytryptophan (5-HTP) 100 MG CAPS Take 1 capsule by mouth at bedtime.     Yes Historical Provider, MD  baclofen (LIORESAL) 10 MG tablet Take 10 mg by mouth 2 (two) times daily.  10/01/11  Yes Historical Provider, MD  buPROPion (WELLBUTRIN XL) 300 MG 24 hr tablet Take 300 mg by mouth daily.     Yes Historical Provider, MD  cyclobenzaprine (FLEXERIL) 10 MG tablet Take 10 mg by mouth at bedtime.     Yes Historical Provider, MD  dexlansoprazole (DEXILANT) 60 MG capsule Take 60 mg by mouth daily.     Yes Historical Provider, MD  doxepin (SINEQUAN) 10 MG capsule Take 10 mg by mouth at bedtime.  10/17/11  Yes Historical Provider, MD  HYDROcodone-acetaminophen (VICODIN ES) 7.5-750 MG per tablet Take 1 tablet by mouth daily as needed for pain.    Yes Historical Provider, MD  Nutritional Supplements (MELATONIN PO) Take 1 capsule by mouth at bedtime.     Yes Historical Provider, MD  promethazine (PHENERGAN) 25 MG tablet Take 25 mg by mouth daily as needed. For nausea   Yes Historical Provider, MD  traMADol (ULTRAM) 50 MG tablet Take 50 mg by mouth every 6 (six) hours as needed. For pain   Yes Historical Provider, MD   Physical Exam: Filed Vitals:   04/23/12 0430 04/23/12 0500 04/23/12 0530 04/23/12 0615  BP: 139/72 130/71 138/78 138/70  Pulse: 94 91 76 83  Temp:    98 F (36.7 C)  TempSrc:      Resp: 13  14 13 18   Height:    5\' 2"  (1.575 m)  Weight:    70.943 kg (156 lb 6.4 oz)  SpO2: 99% 99% 99% 99%     General:  axox3  Eyes: perrrla, eomi   ENT: clear pharynx   Neck: no JVD   Cardiovascular: rrr, no m,r,g   Respiratory: ctab, no w,r,c   Abdomen: soft, nt , bs present   Skin: pale dry   Musculoskeletal: intact   Psychiatric: euthymic   Neurologic: cn 2-12 intact , strength 5/5 all 4   Labs on Admission:  Basic Metabolic Panel:  Recent Labs Lab 04/23/12 0255  NA 138  K 3.7  CL 101  CO2 28  GLUCOSE 159*  BUN 12  CREATININE 0.71  CALCIUM 9.8   MG 2.1   Liver Function Tests:  Recent Labs Lab 04/23/12 0255  AST 18  ALT 21  ALKPHOS 68  BILITOT 0.1*  PROT 7.4  ALBUMIN 3.9   No results found for this basename: LIPASE, AMYLASE,  in the last 168 hours No results found for this basename: AMMONIA,  in the last 168 hours CBC:  Recent Labs Lab 04/23/12 0255  WBC 9.7  HGB 11.7*  HCT 34.3*  MCV 81.3  PLT 316   Cardiac Enzymes: No results found for this basename: CKTOTAL, CKMB, CKMBINDEX, TROPONINI,  in the last 168 hours  BNP (last 3 results) No results found for this basename: PROBNP,  in the last 8760 hours CBG: No results found for this basename: GLUCAP,  in the last 168 hours  Radiological Exams on Admission: Dg Chest 2 View  04/23/2012  *RADIOLOGY REPORT*  Clinical Data: Chest pain.  CHEST - 2 VIEW  Comparison: Chest x-ray 01/12/2007.  Findings: Lung volumes are normal.  No consolidative airspace disease.  No pleural effusions.  No pneumothorax.  No pulmonary nodule or mass noted.  Pulmonary vasculature and the cardiomediastinal silhouette are within normal limits. Atherosclerosis in the thoracic aorta.  Left apical pleuroparenchymal scarring which appears partially calcified, unchanged compared to prior examination.  Surgical clips project over the right quadrant of the abdomen, compatible with prior cholecystectomy.  IMPRESSION: 1. No radiographic evidence of acute cardiopulmonary disease. 2.  Atherosclerosis. 3.  Status post cholecystectomy.   Original Report Authenticated By: Trudie Reed, M.D.     EKG: Independently reviewed. Sinus tachycardia, without significant ST changes   Assessment/Plan Principal Problem:   Chest pain Active Problems:   Chronic insomnia   Fibromyalgia   Hyperlipidemia   1. Chest pain - could be cardiac related - would though be unlikely since it lasted all night and troponin is negative ( just 1 draw though). Alternative diagnoses would be gerd, esophageal spasm, vs related to  fibromyalgia - plan for aspirin, nitro paste, tylenol prn, continue tele. Repeat EKG stat this AM still without ST changes. Plan to repeat troponin, continue to watch and get cardio consult. 2. Anemia - unclear etiology - anemia panel  3. Fibromyalgia - resume home meds  4. HL - repeat FLP     Code Status: full  (must indicate code status--if unknown or must be presumed, indicate so) Family Communication: husband    Maycol Hoying [facility] Pager [phone]  If 7PM-7AM, please contact night-coverage [email or web address] Password [facility] 04/23/2012, 7:32 AM

## 2012-04-24 ENCOUNTER — Observation Stay (HOSPITAL_COMMUNITY): Payer: BC Managed Care – PPO

## 2012-04-24 DIAGNOSIS — K219 Gastro-esophageal reflux disease without esophagitis: Secondary | ICD-10-CM

## 2012-04-24 DIAGNOSIS — I10 Essential (primary) hypertension: Secondary | ICD-10-CM

## 2012-04-24 LAB — CBC
HCT: 35.7 % — ABNORMAL LOW (ref 36.0–46.0)
Hemoglobin: 12 g/dL (ref 12.0–15.0)
MCH: 28.1 pg (ref 26.0–34.0)
MCHC: 33.6 g/dL (ref 30.0–36.0)
MCV: 83.6 fL (ref 78.0–100.0)
Platelets: 330 10*3/uL (ref 150–400)
RBC: 4.27 MIL/uL (ref 3.87–5.11)
RDW: 14.5 % (ref 11.5–15.5)
WBC: 7.4 10*3/uL (ref 4.0–10.5)

## 2012-04-24 LAB — BASIC METABOLIC PANEL
BUN: 13 mg/dL (ref 6–23)
CO2: 25 mEq/L (ref 19–32)
Calcium: 9.6 mg/dL (ref 8.4–10.5)
Chloride: 102 mEq/L (ref 96–112)
Creatinine, Ser: 0.65 mg/dL (ref 0.50–1.10)
GFR calc Af Amer: >90 mL/min (ref >90)
GFR calc non Af Amer: >90 mL/min (ref >90)
Glucose, Bld: 105 mg/dL — ABNORMAL HIGH (ref 70–99)
Potassium: 3.8 mEq/L (ref 3.5–5.1)
Sodium: 138 mEq/L (ref 135–145)

## 2012-04-24 MED ORDER — TECHNETIUM TC 99M SESTAMIBI - CARDIOLITE
10.0000 | Freq: Once | INTRAVENOUS | Status: AC | PRN
  Administered 2012-04-24: 10 via INTRAVENOUS

## 2012-04-24 MED ORDER — DEXLANSOPRAZOLE 60 MG PO CPDR: 60.0000 mg | DELAYED_RELEASE_CAPSULE | Freq: Two times a day (BID) | ORAL | Status: DC

## 2012-04-24 MED ORDER — SUCRALFATE 1 GM/10ML PO SUSP: 1.0000 g | Freq: Three times a day (TID) | ORAL | Status: DC

## 2012-04-24 MED ORDER — REGADENOSON 0.4 MG/5ML IV SOLN
0.4000 mg | Freq: Once | INTRAVENOUS | Status: AC
  Administered 2012-04-24: 0.4 mg via INTRAVENOUS
  Filled 2012-04-24: qty 5

## 2012-04-24 MED ORDER — HYDROMORPHONE HCL PF 1 MG/ML IJ SOLN: 1.0000 mg | Freq: Once | INTRAMUSCULAR | Status: DC

## 2012-04-24 MED ORDER — ASPIRIN EC 81 MG PO TBEC: 81.0000 mg | DELAYED_RELEASE_TABLET | Freq: Every day | ORAL | Status: DC

## 2012-04-24 MED ORDER — TECHNETIUM TC 99M SESTAMIBI GENERIC - CARDIOLITE
30.0000 | Freq: Once | INTRAVENOUS | Status: AC | PRN
  Administered 2012-04-24: 30 via INTRAVENOUS

## 2012-04-24 MED ORDER — PRAVASTATIN SODIUM 40 MG PO TABS: 40.0000 mg | ORAL_TABLET | Freq: Every day | ORAL | Status: DC

## 2012-04-24 MED ORDER — REGADENOSON 0.4 MG/5ML IV SOLN
INTRAVENOUS | Status: AC
  Filled 2012-04-24: qty 5

## 2012-04-24 NOTE — Progress Notes (Signed)
SUBJECTIVE:  Still complains of intermittent CP despite normal enzymes and normal EKG  OBJECTIVE:   Vitals:   Filed Vitals:   04/23/12 1445 04/23/12 2100 04/24/12 0500 04/24/12 1010  BP: 129/77 129/70 108/55 148/81  Pulse: 88 88 76 94  Temp: 98.4 F (36.9 C) 98.4 F (36.9 C) 97.9 F (36.6 C)   TempSrc: Oral Oral Oral   Resp: 17 18 18    Height:      Weight:      SpO2: 99% 99% 100%    I&O's:   Intake/Output Summary (Last 24 hours) at 04/24/12 1037 Last data filed at 04/23/12 2141  Gross per 24 hour  Intake    243 ml  Output    800 ml  Net   -557 ml   TELEMETRY: Reviewed telemetry pt in NSR:     PHYSICAL EXAM General: Well developed, well nourished, in no acute distress Head: Eyes PERRLA, No xanthomas.   Normal cephalic and atramatic  Lungs:   Clear bilaterally to auscultation and percussion. Heart:   HRRR S1 S2 Pulses are 2+ & equal. Abdomen: Bowel sounds are positive, abdomen soft and non-tender without masses  Extremities:   No clubbing, cyanosis or edema.  DP +1 Neuro: Alert and oriented X 3. Psych:  Good affect, responds appropriately   LABS: Basic Metabolic Panel:  Recent Labs  30/86/57 0255 04/24/12 0618  NA 138 138  K 3.7 3.8  CL 101 102  CO2 28 25  GLUCOSE 159* 105*  BUN 12 13  CREATININE 0.71 0.65  CALCIUM 9.8 9.6  MG 2.1  --    Liver Function Tests:  Recent Labs  04/23/12 0255  AST 18  ALT 21  ALKPHOS 68  BILITOT 0.1*  PROT 7.4  ALBUMIN 3.9   No results found for this basename: LIPASE, AMYLASE,  in the last 72 hours CBC:  Recent Labs  04/23/12 0255 04/24/12 0618  WBC 9.7 7.4  HGB 11.7* 12.0  HCT 34.3* 35.7*  MCV 81.3 83.6  PLT 316 330   Cardiac Enzymes:  Recent Labs  04/23/12 0746 04/23/12 1335  TROPONINI <0.30 <0.30   BNP: No components found with this basename: POCBNP,  D-Dimer:  Recent Labs  04/23/12 0310  DDIMER <0.27   Hemoglobin A1C: No results found for this basename: HGBA1C,  in the last 72  hours Fasting Lipid Panel:  Recent Labs  04/23/12 0746  CHOL 260*  HDL 75  LDLCALC 159*  TRIG 129  CHOLHDL 3.5   Thyroid Function Tests:  Recent Labs  04/23/12 0746  TSH 2.208   Anemia Panel:  Recent Labs  04/23/12 0746  VITAMINB12 362  FOLATE >20.0  FERRITIN 25  TIBC 372  IRON 64  RETICCTPCT 1.3   Coag Panel:   Lab Results  Component Value Date   INR 0.98 04/23/2012    RADIOLOGY: Dg Chest 2 View  04/23/2012  *RADIOLOGY REPORT*  Clinical Data: Chest pain.  CHEST - 2 VIEW  Comparison: Chest x-ray 01/12/2007.  Findings: Lung volumes are normal.  No consolidative airspace disease.  No pleural effusions.  No pneumothorax.  No pulmonary nodule or mass noted.  Pulmonary vasculature and the cardiomediastinal silhouette are within normal limits. Atherosclerosis in the thoracic aorta.  Left apical pleuroparenchymal scarring which appears partially calcified, unchanged compared to prior examination.  Surgical clips project over the right quadrant of the abdomen, compatible with prior cholecystectomy.  IMPRESSION: 1. No radiographic evidence of acute cardiopulmonary disease. 2.  Atherosclerosis. 3.  Status post cholecystectomy.   Original Report Authenticated By: Trudie Reed, M.D.    ASSESSMENT:  1. Atyipcal chest pain but with cardiac risk factors including post menopausal state, remote tobacco use, dyslipidemia and family history of CAD. EKG is normal and cardiac enzymes  are normal.  2. Fibromyalgia  3. Hyperlipidemia  4. Breast CA  PLAN: 1.  Lexiscan Cardiolyte today to rule out ischemia     Quintella Reichert, MD  04/24/2012  10:37 AM

## 2012-04-24 NOTE — Discharge Summary (Signed)
Physician Discharge Summary  Linda [name] ZOX:[id number] DOB: 08-24-1950 DOA: 04/23/2012  PCP: Geraldo Pitter, MD  Admit date: 04/23/2012 Discharge date: 04/24/2012  Time spent: >30 minutes  Recommendations for Outpatient Follow-up:  1. CMET in 4 weeks to follow LFT's as she was staretd on statins 2. Reassess BP and start medication regimen if needed (mild elevated during admission; recommended to follow low sodium diet) 3. Arrange EGD and colonoscopy to further stratify patient anemia 4. CBC to follow Hgb  Discharge Diagnoses:  Principal Problem:   Chest pain Active Problems:   Chronic insomnia   Fibromyalgia   Hyperlipidemia   Discharge Condition: stable and improved. Advise to follow discharge instructions and medications as prescribed  Diet recommendation: heart healthy  Filed Weights   04/23/12 0615  Weight: 70.943 kg (156 lb 6.4 oz)    History of present illness:  62 y.o. female with hx of gerd and fibromyalgia, who came to the ED last night c/o moderate to severe new onset retrosternal chest pain , flet like squeezing pressure, not brought on by effort, no radiation. Not relieved by nitroglycerin. She has a strong family history of CAD and personal history of tobacco use many years ago. No N,V, fever, chills or any other complaints.  Hospital Course:  1. Chest pain - non cardiac. Most likely due to GERD and/or fibromyalgia.  2. -CE'z neg X3, no EKG abnormalities or abnormalities on telemetry.       -myoview negative       -PPI increased to BID       -advised to follow a heart healthy diet and to use EC               baby ASA.  3. Mild Anemia - unclear etiology - anemia panel demonstrating AOCD and some iron deficiency component. No acute signs of bleeding. Hgb stable. For stratification will recommend EGD/colonoscopy as an outpatient.   4. Fibromyalgia - resume home meds   5. HLD - LDL 159 started on  Statins.  *Rest of medical problems remains stable and the plan  is to continue current medication regimen.  Procedures: Myoview: no inducible ischemia appreciated (EF 52%)  Consultations:  cardiology  Discharge Exam: Filed Vitals:   04/24/12 1039 04/24/12 1041 04/24/12 1043 04/24/12 1341  BP:  133/65 132/68 124/77  Pulse: 118 115 109 97  Temp:    98 F (36.7 C)  TempSrc:    Oral  Resp:    20  Height:      Weight:      SpO2:    99%    General: NAD, mild discomfort on her chest, no SOB Cardiovascular: s1 and s2, no rubs, no gallops, no murmurs Respiratory: CTA bilaterally Abdomen: soft, ND, positive BS Extremities: no edema no cyanosis  Discharge Instructions  Discharge Orders   Future Appointments Provider Department Dept Phone   11/11/2012 3:00 PM Rachael Fee [facility] [phone]   11/11/2012 3:30 PM Josph Macho, MD [facility] [phone]   Future Orders Complete By Expires     Diet - low sodium heart healthy  As directed     Discharge instructions  As directed     Comments:      Follow a low sodium/heart healthy diet Take medications as prescribed Follow with PCP in 2 weeks        Medication List    TAKE these medications       5-HTP  100 MG Caps  Take 1 capsule by mouth at bedtime.     aspirin EC 81 MG tablet  Take 1 tablet (81 mg total) by mouth daily.     baclofen 10 MG tablet  Commonly known as:  LIORESAL  Take 10 mg by mouth 2 (two) times daily.     buPROPion 300 MG 24 hr tablet  Commonly known as:  WELLBUTRIN XL  Take 300 mg by mouth daily.     cyclobenzaprine 10 MG tablet  Commonly known as:  FLEXERIL  Take 10 mg by mouth at bedtime.     dexlansoprazole 60 MG capsule  Commonly known as:  DEXILANT  Take 1 capsule (60 mg total) by mouth 2 (two) times daily.     doxepin 10 MG capsule  Commonly known as:  SINEQUAN  Take 10 mg by mouth at bedtime.     HYDROcodone-acetaminophen 7.5-750 MG per tablet  Commonly known as:  VICODIN ES   Take 1 tablet by mouth daily as needed for pain.     MELATONIN PO  Take 1 capsule by mouth at bedtime.     promethazine 25 MG tablet  Commonly known as:  PHENERGAN  Take 25 mg by mouth daily as needed. For nausea     sucralfate 1 GM/10ML suspension  Commonly known as:  CARAFATE  Take 10 mLs (1 g total) by mouth 3 (three) times daily.     traMADol 50 MG tablet  Commonly known as:  ULTRAM  Take 50 mg by mouth every 6 (six) hours as needed. For pain           Follow-up Information   Follow up with Geraldo Pitter, MD. Schedule an appointment as soon as possible for a visit in 2 weeks.   Contact information:   [address] [place] [address] [phone]        The results of significant diagnostics from this hospitalization (including imaging, microbiology, ancillary and laboratory) are listed below for reference.    Significant Diagnostic Studies: Dg Chest 2 View  04/23/2012  *RADIOLOGY REPORT*  Clinical Data: Chest pain.  CHEST - 2 VIEW  Comparison: Chest x-ray 01/12/2007.  Findings: Lung volumes are normal.  No consolidative airspace disease.  No pleural effusions.  No pneumothorax.  No pulmonary nodule or mass noted.  Pulmonary vasculature and the cardiomediastinal silhouette are within normal limits. Atherosclerosis in the thoracic aorta.  Left apical pleuroparenchymal scarring which appears partially calcified, unchanged compared to prior examination.  Surgical clips project over the right quadrant of the abdomen, compatible with prior cholecystectomy.  IMPRESSION: 1. No radiographic evidence of acute cardiopulmonary disease. 2.  Atherosclerosis. 3.  Status post cholecystectomy.   Original Report Authenticated By: Trudie Reed, M.D.    Nm Myocar Multi W/spect W/wall Motion / Ef  04/24/2012  *RADIOLOGY REPORT*  Clinical Data:  Chest pain  MYOCARDIAL IMAGING WITH SPECT (REST AND PHARMACOLOGIC-STRESS) GATED LEFT VENTRICULAR WALL MOTION STUDY LEFT VENTRICULAR  EJECTION FRACTION  Technique:  Standard myocardial SPECT imaging was performed after resting intravenous injection of 10.2 mCi Tc-56m sestamibi. Subsequently, intravenous infusion of Lexiscan was performed under the supervision of the Cardiology staff.  At peak effect of the drug, 31.6 mCi Tc-73m sestamibi was injected intravenously and standard myocardial SPECT imaging was performed.  Quantitative gated imaging was also performed to evaluate left ventricular wall motion and estimate left ventricular ejection fraction.  Comparison:  None.  Findings:  Spect:  No definite inducible or reversible  ischemia with pharmacologic stress.  No fixed defects.  Wall motion:  Grossly normal wall motion and thickening  Ejection fraction:  Calculated Q G S ejection fraction is 52%  IMPRESSION: No inducible ischemia with pharmacologic stress   Original Report Authenticated By: Judie Petit. Shick, M.D.    Labs: Basic Metabolic Panel:  Recent Labs Lab 04/23/12 0255 04/24/12 0618  NA 138 138  K 3.7 3.8  CL 101 102  CO2 28 25  GLUCOSE 159* 105*  BUN 12 13  CREATININE 0.71 0.65  CALCIUM 9.8 9.6  MG 2.1  --    Liver Function Tests:  Recent Labs Lab 04/23/12 0255  AST 18  ALT 21  ALKPHOS 68  BILITOT 0.1*  PROT 7.4  ALBUMIN 3.9   CBC:  Recent Labs Lab 04/23/12 0255 04/24/12 0618  WBC 9.7 7.4  HGB 11.7* 12.0  HCT 34.3* 35.7*  MCV 81.3 83.6  PLT 316 330   Cardiac Enzymes:  Recent Labs Lab 04/23/12 0746 04/23/12 1335  TROPONINI <0.30 <0.30    Signed:  Onnika Siebel  [facility] 04/24/2012, 3:10 PM

## 2012-04-24 NOTE — Progress Notes (Signed)
Utilization review completed.  

## 2012-04-26 ENCOUNTER — Other Ambulatory Visit: Payer: Self-pay

## 2012-04-26 DIAGNOSIS — Z1231 Encounter for screening mammogram for malignant neoplasm of breast: Secondary | ICD-10-CM

## 2012-05-20 ENCOUNTER — Ambulatory Visit
Admission: RE | Admit: 2012-05-20 | Discharge: 2012-05-20 | Disposition: A | Discharge status: Home / Self Care | Payer: BC Managed Care – PPO | Source: Ambulatory Visit

## 2012-05-20 DIAGNOSIS — Z1231 Encounter for screening mammogram for malignant neoplasm of breast: Secondary | ICD-10-CM

## 2012-09-06 ENCOUNTER — Ambulatory Visit: Payer: Self-pay | Admitting: Obstetrics

## 2012-09-19 ENCOUNTER — Other Ambulatory Visit: Payer: Self-pay | Admitting: Orthopedic Surgery

## 2012-09-19 ENCOUNTER — Ambulatory Visit
Admission: RE | Admit: 2012-09-19 | Discharge: 2012-09-19 | Disposition: A | Discharge status: Home / Self Care | Payer: BC Managed Care – PPO | Source: Ambulatory Visit | Attending: Orthopedic Surgery | Admitting: Orthopedic Surgery

## 2012-09-19 DIAGNOSIS — S161XXA Strain of muscle, fascia and tendon at neck level, initial encounter: Secondary | ICD-10-CM

## 2012-09-19 DIAGNOSIS — S46911A Strain of unspecified muscle, fascia and tendon at shoulder and upper arm level, right arm, initial encounter: Secondary | ICD-10-CM

## 2012-09-28 ENCOUNTER — Encounter: Payer: Self-pay | Admitting: Obstetrics

## 2012-09-28 ENCOUNTER — Ambulatory Visit (INDEPENDENT_AMBULATORY_CARE_PROVIDER_SITE_OTHER): Payer: BC Managed Care – PPO | Admitting: Obstetrics

## 2012-09-28 VITALS — BP 119/72 | HR 105 | Temp 98.1°F | Ht 62.0 in | Wt 150.0 lb

## 2012-09-28 DIAGNOSIS — Z01419 Encounter for gynecological examination (general) (routine) without abnormal findings: Secondary | ICD-10-CM

## 2012-09-28 DIAGNOSIS — Z0000 Encounter for general adult medical examination without abnormal findings: Secondary | ICD-10-CM

## 2012-09-28 NOTE — Progress Notes (Signed)
Subjective:     Linda [name] is a 62 y.o. female here for a routine exam.  Current complaints: annual exam. Pt states she has no concerns at this time. Personal health questionnaire reviewed: yes.   Gynecologic History No LMP recorded. Patient has had a hysterectomy. Contraception: status post hysterectomy Last Pap: post hysterectomy  Last mammogram: 04/2012. Results were: normal  Obstetric History OB History  No data available     The following portions of the patient's history were reviewed and updated as appropriate: allergies, current medications, past family history, past medical history, past social history, past surgical history and problem list.  Review of Systems Pertinent items are noted in HPI.    Objective:    General appearance: alert and no distress Breasts: normal appearance, no masses or tenderness Abdomen: normal findings: soft, non-tender Pelvic: external genitalia normal, no adnexal masses or tenderness, uterus surgically absent and vagina normal without discharge Extremities: extremities normal, atraumatic, no cyanosis or edema    Assessment:    Healthy female exam.    Plan:    Education reviewed: calcium supplements, self breast exams and postmenopausal changes. Follow up in: 1 year. Vaginal prebiotic and probiotic products dispensed for vaginal dryness,and dyspaureunia.

## 2012-09-29 LAB — WET PREP BY MOLECULAR PROBE
Candida species: NEGATIVE
Gardnerella vaginalis: NEGATIVE
Trichomonas vaginosis: NEGATIVE

## 2012-11-11 ENCOUNTER — Ambulatory Visit (HOSPITAL_BASED_OUTPATIENT_CLINIC_OR_DEPARTMENT_OTHER): Payer: BC Managed Care – PPO | Admitting: Hematology & Oncology

## 2012-11-11 ENCOUNTER — Ambulatory Visit: Payer: BC Managed Care – PPO

## 2012-11-11 ENCOUNTER — Other Ambulatory Visit: Payer: Self-pay | Admitting: *Deleted

## 2012-11-11 ENCOUNTER — Ambulatory Visit (HOSPITAL_BASED_OUTPATIENT_CLINIC_OR_DEPARTMENT_OTHER): Payer: BC Managed Care – PPO | Admitting: Lab

## 2012-11-11 DIAGNOSIS — Z853 Personal history of malignant neoplasm of breast: Secondary | ICD-10-CM

## 2012-11-11 DIAGNOSIS — C50912 Malignant neoplasm of unspecified site of left female breast: Secondary | ICD-10-CM

## 2012-11-11 DIAGNOSIS — Z8639 Personal history of other endocrine, nutritional and metabolic disease: Secondary | ICD-10-CM

## 2012-11-11 DIAGNOSIS — L509 Urticaria, unspecified: Secondary | ICD-10-CM | POA: Insufficient documentation

## 2012-11-11 LAB — CBC WITH DIFFERENTIAL (CANCER CENTER ONLY)
BASO#: 0 10*3/uL (ref 0.0–0.2)
BASO%: 0.3 % (ref 0.0–2.0)
EOS%: 0.2 % (ref 0.0–7.0)
Eosinophils Absolute: 0 10*3/uL (ref 0.0–0.5)
HCT: 36 % (ref 34.8–46.6)
HGB: 11.9 g/dL (ref 11.6–15.9)
LYMPH#: 3.6 10*3/uL — ABNORMAL HIGH (ref 0.9–3.3)
LYMPH%: 33.9 % (ref 14.0–48.0)
MCH: 27.9 pg (ref 26.0–34.0)
MCHC: 33.1 g/dL (ref 32.0–36.0)
MCV: 85 fL (ref 81–101)
MONO#: 0.6 10*3/uL (ref 0.1–0.9)
MONO%: 5.8 % (ref 0.0–13.0)
NEUT#: 6.3 10*3/uL (ref 1.5–6.5)
NEUT%: 59.8 % (ref 39.6–80.0)
Platelets: 357 10*3/uL (ref 145–400)
RBC: 4.26 10*6/uL (ref 3.70–5.32)
RDW: 14.4 % (ref 11.1–15.7)
WBC: 10.5 10*3/uL — ABNORMAL HIGH (ref 3.9–10.0)

## 2012-11-11 MED ORDER — FAMOTIDINE 20 MG PO TABS
40.0000 mg | ORAL_TABLET | Freq: Once | ORAL | Status: AC
  Administered 2012-11-11: 40 mg via ORAL

## 2012-11-11 NOTE — Progress Notes (Signed)
This office note has been dictated.

## 2012-11-11 NOTE — Patient Instructions (Signed)
Hives  Hives are itchy, red, puffy (swollen) areas of the skin. Hives can change in size and location on your body. Hives can come and go for hours, days, or weeks. Hives do not spread from person to person (noncontagious). Scratching, exercise, and stress can make your hives worse.  HOME CARE  · Avoid things that cause your hives (triggers).  · Take antihistamine medicines as told by your doctor. Do not drive while taking an antihistamine.  · Take any other medicines for itching as told by your doctor.  · Wear loose-fitting clothing.  · Keep all doctor visits as told.  GET HELP RIGHT AWAY IF:   · You have a fever.  · Your tongue or lips are puffy.  · You have trouble breathing or swallowing.  · You feel tightness in the throat or chest.  · You have belly (abdominal) pain.  · You have lasting or severe itching that is not helped by medicine.  · You have painful or puffy joints.  These problems may be the first sign of a life-threatening allergic reaction. Call your local emergency services (911 in [place]  MAKE SURE YOU:   · Understand these instructions.  · Will watch your condition.  · Will get help right away if you are not doing well or get worse.  Document Released: 10/22/2007 Document Revised: 07/14/2011 Document Reviewed: 04/07/2011  ExitCare® Patient Information ©2014 [employer].

## 2012-11-12 LAB — COMPREHENSIVE METABOLIC PANEL
ALT: 16 U/L (ref 0–35)
AST: 15 U/L (ref 0–37)
Albumin: 4.5 g/dL (ref 3.5–5.2)
Alkaline Phosphatase: 75 U/L (ref 39–117)
BUN: 13 mg/dL (ref 6–23)
CO2: 26 mEq/L (ref 19–32)
Calcium: 9.5 mg/dL (ref 8.4–10.5)
Chloride: 101 mEq/L (ref 96–112)
Creatinine, Ser: 0.87 mg/dL (ref 0.50–1.10)
Glucose, Bld: 121 mg/dL — ABNORMAL HIGH (ref 70–99)
Potassium: 3.5 mEq/L (ref 3.5–5.3)
Sodium: 137 mEq/L (ref 135–145)
Total Bilirubin: 0.3 mg/dL (ref 0.3–1.2)
Total Protein: 7.1 g/dL (ref 6.0–8.3)

## 2012-11-12 LAB — VITAMIN D 25 HYDROXY (VIT D DEFICIENCY, FRACTURES): Vit D, 25-Hydroxy: 41 ng/mL (ref 30–89)

## 2012-11-12 NOTE — Progress Notes (Signed)
CC:   Renaye Rakers, M.D.  DIAGNOSIS:  Stage IIA (T2 N0 M0) ductal carcinoma of the left breast, triple-negative.  CURRENT THERAPY:  Observation.  INTERIM HISTORY:  Ms. Droege comes in for followup.  We see her yearly. She has been doing pretty well.  She has had no specific complaints since we last saw her.  She is still working.  She is going to retire in June.  There has been no change in her medications.  She has had no problems with change in bowel or bladder habits.  She has had no cough or shortness of breath.  Her mammogram was in April of this year.  Everything looked fine on the mammogram.  She has had no problems with fever, sweats or chills.  There has been no bony pain.  She has had no headaches.  PHYSICAL EXAMINATION:  General:  This is a well-developed, well- nourished African American female in no obvious distress.  Vital signs: Temperature of 98.7, pulse 100, respiratory rate 14, blood pressure 142/78.  Weight is 152 pounds.  Head and neck:  Normocephalic, atraumatic skull.  She has no ocular or oral lesions.  There are no palpable cervical or supraclavicular lymph nodes.  Lungs:  Clear bilaterally.  Cardiac:  Regular rate and rhythm with a normal S1 and S2. There are no murmurs, rubs or bruits.  Breasts:  Right breast with no masses, edema or erythema.  There is no right axillary adenopathy.  Left breast shows a well-healed lumpectomy at the 2 o'clock position.  She has no distinct mass in the left breast.  There may be some slight contraction of the left breast from radiation.  No nipple discharge is noted.  There is no left axillary adenopathy.  Abdomen:  Soft.  She has good bowel sounds.  There is no fluid wave.  There is no palpable abdominal mass.  There is no palpable hepatosplenomegaly.  Back:  No kyphosis or osteoporotic changes.  There is no tenderness over the spine, ribs, or hips.  Extremities:  No clubbing, cyanosis or edema.  No lymphedema is noted  in the left arm.  She has good range motion of her joints.  She has good muscle strength in upper and lower extremities. Neurologic:  No focal neurological deficits.  Skin:  Does show some areas of urticaria.  LABORATORY STUDIES:  White cell count is 10.5, hemoglobin 11.9, hematocrit 36, platelet count 357.  IMPRESSION:  Ms. Runquist is a very charming 62 year old African American female.  She now is out 12 years from completion of her chemotherapy for her breast cancer.  She had stage IIA ductal carcinoma of the left breast.  She received chemotherapy with Adriamycin/Cytoxan.  I see no evidence of congestive heart failure.  For the urticaria, we went ahead and gave her some Pepcid in the office. She had 40 mg p.o.  I think we can probably get her back in 1 year.  I do not see a need for any scans before we see her back.    ______________________________ Josph Macho, M.D. PRE/MEDQ  D:  11/11/2012  T:  11/12/2012  Job:  [id number]

## 2012-11-14 ENCOUNTER — Telehealth: Payer: Self-pay | Admitting: Nurse Practitioner

## 2012-11-14 NOTE — Telephone Encounter (Addendum)
Message copied by Glee Arvin on Mon Nov 14, 2012  1:43 PM ------      Message from: Josph Macho      Created: Sat Nov 12, 2012 10:53 AM       Call - vit d is good!! Consulting civil engineer ------LVM with pt informing her to please contact our office at her convenience. No details left on VM as instructed.

## 2012-12-02 ENCOUNTER — Other Ambulatory Visit: Payer: Self-pay | Admitting: Orthopedic Surgery

## 2012-12-02 DIAGNOSIS — M542 Cervicalgia: Secondary | ICD-10-CM

## 2012-12-16 ENCOUNTER — Ambulatory Visit
Admission: RE | Admit: 2012-12-16 | Discharge: 2012-12-16 | Disposition: A | Discharge status: Home / Self Care | Payer: BC Managed Care – PPO | Source: Ambulatory Visit | Attending: Orthopedic Surgery | Admitting: Orthopedic Surgery

## 2012-12-16 DIAGNOSIS — M542 Cervicalgia: Secondary | ICD-10-CM

## 2013-02-16 ENCOUNTER — Other Ambulatory Visit: Payer: Self-pay | Admitting: Orthopedic Surgery

## 2013-02-16 DIAGNOSIS — M5412 Radiculopathy, cervical region: Secondary | ICD-10-CM

## 2013-02-16 DIAGNOSIS — M542 Cervicalgia: Secondary | ICD-10-CM

## 2013-02-28 ENCOUNTER — Ambulatory Visit
Admission: RE | Admit: 2013-02-28 | Discharge: 2013-02-28 | Disposition: A | Discharge status: Home / Self Care | Payer: BC Managed Care – PPO | Source: Ambulatory Visit | Attending: Orthopedic Surgery | Admitting: Orthopedic Surgery

## 2013-02-28 VITALS — BP 150/64 | HR 104

## 2013-02-28 DIAGNOSIS — M797 Fibromyalgia: Secondary | ICD-10-CM

## 2013-02-28 DIAGNOSIS — M542 Cervicalgia: Secondary | ICD-10-CM

## 2013-02-28 DIAGNOSIS — M5412 Radiculopathy, cervical region: Secondary | ICD-10-CM

## 2013-02-28 MED ORDER — IOHEXOL 300 MG/ML  SOLN
1.0000 mL | Freq: Once | INTRAMUSCULAR | Status: AC | PRN
  Administered 2013-02-28: 1 mL via EPIDURAL

## 2013-02-28 MED ORDER — TRIAMCINOLONE ACETONIDE 40 MG/ML IJ SUSP (RADIOLOGY)
60.0000 mg | Freq: Once | INTRAMUSCULAR | Status: AC
  Administered 2013-02-28: 60 mg via EPIDURAL

## 2013-02-28 NOTE — Discharge Instructions (Signed)

## 2013-03-09 ENCOUNTER — Other Ambulatory Visit: Payer: BC Managed Care – PPO

## 2013-03-30 ENCOUNTER — Other Ambulatory Visit: Payer: Self-pay | Admitting: Orthopedic Surgery

## 2013-03-30 DIAGNOSIS — M542 Cervicalgia: Secondary | ICD-10-CM

## 2013-04-03 ENCOUNTER — Ambulatory Visit
Admission: RE | Admit: 2013-04-03 | Discharge: 2013-04-03 | Disposition: A | Discharge status: Home / Self Care | Payer: BC Managed Care – PPO | Source: Ambulatory Visit | Attending: Orthopedic Surgery | Admitting: Orthopedic Surgery

## 2013-04-03 DIAGNOSIS — M542 Cervicalgia: Secondary | ICD-10-CM

## 2013-04-03 MED ORDER — IOHEXOL 300 MG/ML  SOLN
1.0000 mL | Freq: Once | INTRAMUSCULAR | Status: AC | PRN
  Administered 2013-04-03: 1 mL via EPIDURAL

## 2013-04-03 MED ORDER — TRIAMCINOLONE ACETONIDE 40 MG/ML IJ SUSP (RADIOLOGY)
60.0000 mg | Freq: Once | INTRAMUSCULAR | Status: AC
  Administered 2013-04-03: 60 mg via EPIDURAL

## 2013-04-03 NOTE — Discharge Instructions (Signed)

## 2013-05-09 ENCOUNTER — Other Ambulatory Visit: Payer: Self-pay

## 2013-05-09 DIAGNOSIS — Z1231 Encounter for screening mammogram for malignant neoplasm of breast: Secondary | ICD-10-CM

## 2013-05-22 ENCOUNTER — Ambulatory Visit
Admission: RE | Admit: 2013-05-22 | Discharge: 2013-05-22 | Disposition: A | Discharge status: Home / Self Care | Payer: Self-pay | Source: Ambulatory Visit

## 2013-05-22 DIAGNOSIS — Z1231 Encounter for screening mammogram for malignant neoplasm of breast: Secondary | ICD-10-CM

## 2013-07-20 ENCOUNTER — Telehealth (INDEPENDENT_AMBULATORY_CARE_PROVIDER_SITE_OTHER): Payer: Self-pay

## 2013-07-20 NOTE — Telephone Encounter (Signed)
Close encounter 

## 2013-09-04 ENCOUNTER — Telehealth: Payer: Self-pay | Admitting: Hematology & Oncology

## 2013-09-04 NOTE — Telephone Encounter (Signed)
Left pt message on cell and home ( work is not direct number) moved 10-19 to 10-26

## 2013-09-28 ENCOUNTER — Encounter: Payer: Self-pay | Admitting: Obstetrics

## 2013-09-28 ENCOUNTER — Ambulatory Visit (INDEPENDENT_AMBULATORY_CARE_PROVIDER_SITE_OTHER): Payer: BC Managed Care – PPO | Admitting: Obstetrics

## 2013-09-28 VITALS — BP 150/81 | HR 107 | Temp 99.2°F | Ht 62.0 in | Wt 158.0 lb

## 2013-09-28 DIAGNOSIS — Z01419 Encounter for gynecological examination (general) (routine) without abnormal findings: Secondary | ICD-10-CM

## 2013-09-28 LAB — WET PREP BY MOLECULAR PROBE
Candida species: NEGATIVE
Gardnerella vaginalis: NEGATIVE
Trichomonas vaginosis: NEGATIVE

## 2013-09-28 NOTE — Addendum Note (Signed)
Addended by: Ladona Ridgel on: 09/28/2013 05:20 PM   Modules accepted: Orders

## 2013-09-28 NOTE — Progress Notes (Signed)
Subjective:     Linda [name] is a 63 y.o. female here for a routine exam.  Current complaints: none.    Personal health questionnaire:  Is patient Ashkenazi Jewish, have a family history of breast and/or ovarian cancer: no Is there a family history of uterine cancer diagnosed at age < 67, gastrointestinal cancer, urinary tract cancer, family member who is a Field seismologist syndrome-associated carrier: no Is the patient overweight and hypertensive, family history of diabetes, personal history of gestational diabetes or PCOS: no Is patient over 9, have PCOS,  family history of premature CHD under age 55, diabetes, smoke, have hypertension or peripheral artery disease:  no At any time, has a partner hit, kicked or otherwise hurt or frightened you?: no Over the past 2 weeks, have you felt down, depressed or hopeless?: no Over the past 2 weeks, have you felt little interest or pleasure in doing things?:no   Gynecologic History No LMP recorded. Patient has had a hysterectomy. Contraception: status post hysterectomy Last Pap: years ago. Results were: normal Last mammogram: 2015. Results were: normal  Obstetric History OB History  Gravida Para Term Preterm AB SAB TAB Ectopic Multiple Living  3 3 3       3     # Outcome Date GA Lbr Len/2nd Weight Sex Delivery Anes PTL Lv  3 TRM         Y  2 TRM         Y  1 TRM         Y      Past Medical History  Diagnosis Date  . Hyperlipidemia   . Fibromyalgia   . Allergic rhinitis   . Breast cancer 2002    left    Past Surgical History  Procedure Laterality Date  . Cholecystectomy  1989  . Vaginal hysterectomy  1987  . Appendectomy  1989  . Breast lumpectomy  2002    left    Current outpatient prescriptions:5-Hydroxytryptophan (5-HTP) 100 MG CAPS, Take 1 capsule by mouth at bedtime.  , Disp: , Rfl: ;  aspirin EC 81 MG tablet, Take 1 tablet (81 mg total) by mouth daily., Disp: , Rfl: ;  baclofen (LIORESAL) 10 MG tablet, Take 10 mg by mouth 2 (two)  times daily. , Disp: , Rfl: ;  buPROPion (WELLBUTRIN XL) 300 MG 24 hr tablet, Take 300 mg by mouth daily.  , Disp: , Rfl:  dexlansoprazole (DEXILANT) 60 MG capsule, Take 1 capsule (60 mg total) by mouth 2 (two) times daily., Disp: 60 capsule, Rfl: 1;  HYDROcodone-acetaminophen (VICODIN ES) 7.5-750 MG per tablet, Take 1 tablet by mouth daily as needed for pain. , Disp: , Rfl: ;  Nutritional Supplements (MELATONIN PO), Take 1 capsule by mouth at bedtime.  , Disp: , Rfl:  promethazine (PHENERGAN) 25 MG tablet, Take 25 mg by mouth daily as needed. For nausea, Disp: , Rfl: ;  traMADol (ULTRAM) 50 MG tablet, Take 50 mg by mouth every 6 (six) hours as needed. For pain, Disp: , Rfl:  No Known Allergies  History  Substance Use Topics  . Smoking status: Former Smoker -- 1.50 packs/day for 20 years    Types: Cigarettes    Quit date: 01/27/1988  . Smokeless tobacco: Never Used  . Alcohol Use: No    Family History  Problem Relation Age of Onset  . Lung cancer Father   . Breast cancer Maternal Aunt   . CAD Mother   . Breast cancer Daughter  Review of Systems  Constitutional: negative for fatigue and weight loss Respiratory: negative for cough and wheezing Cardiovascular: negative for chest pain, fatigue and palpitations Gastrointestinal: negative for abdominal pain and change in bowel habits Musculoskeletal:negative for myalgias Neurological: negative for gait problems and tremors Behavioral/Psych: negative for abusive relationship, depression Endocrine: negative for temperature intolerance   Genitourinary:negative for abnormal menstrual periods, genital lesions, hot flashes, sexual problems and vaginal discharge Integument/breast: negative for breast lump, breast tenderness, nipple discharge and skin lesion(s)    Objective:       BP 150/81  Pulse 107  Temp(Src) 99.2 F (37.3 C)  Ht 5\' 2"  (1.575 m)  Wt 158 lb (71.668 kg)  BMI 28.89 kg/m2 General:   alert  Skin:   no rash or  abnormalities  Lungs:   clear to auscultation bilaterally  Heart:   regular rate and rhythm, S1, S2 normal, no murmur, click, rub or gallop  Breasts:   normal without suspicious masses, skin or nipple changes or axillary nodes  Abdomen:  normal findings: no organomegaly, soft, non-tender and no hernia  Pelvis:  External genitalia: normal general appearance Urinary system: urethral meatus normal and bladder without fullness, nontender Vaginal: normal without tenderness, induration or masses Cervix: normal appearance Adnexa: normal bimanual exam Uterus: anteverted and non-tender, normal size   Lab Review Urine pregnancy test Labs reviewed yes Radiologic studies reviewed yes   Assessment:    Healthy female exam.   Postmenopause   Plan:    Education reviewed: calcium supplements, low fat, low cholesterol diet, self breast exams and weight bearing exercise. Follow up in: 1 year.   No orders of the defined types were placed in this encounter.   No orders of the defined types were placed in this encounter.

## 2013-11-13 ENCOUNTER — Ambulatory Visit: Payer: BC Managed Care – PPO | Admitting: Hematology & Oncology

## 2013-11-13 ENCOUNTER — Other Ambulatory Visit: Payer: BC Managed Care – PPO | Admitting: Lab

## 2013-11-17 ENCOUNTER — Other Ambulatory Visit: Payer: Self-pay | Admitting: *Deleted

## 2013-11-17 DIAGNOSIS — F5104 Psychophysiologic insomnia: Secondary | ICD-10-CM

## 2013-11-20 ENCOUNTER — Other Ambulatory Visit (HOSPITAL_BASED_OUTPATIENT_CLINIC_OR_DEPARTMENT_OTHER): Payer: BC Managed Care – PPO | Admitting: Lab

## 2013-11-20 ENCOUNTER — Encounter: Payer: Self-pay | Admitting: Hematology & Oncology

## 2013-11-20 ENCOUNTER — Ambulatory Visit (HOSPITAL_BASED_OUTPATIENT_CLINIC_OR_DEPARTMENT_OTHER): Payer: BC Managed Care – PPO | Admitting: Hematology & Oncology

## 2013-11-20 VITALS — BP 143/75 | HR 102 | Temp 98.4°F | Resp 14 | Ht 62.0 in | Wt 160.0 lb

## 2013-11-20 DIAGNOSIS — F5104 Psychophysiologic insomnia: Secondary | ICD-10-CM

## 2013-11-20 DIAGNOSIS — Z853 Personal history of malignant neoplasm of breast: Secondary | ICD-10-CM

## 2013-11-20 DIAGNOSIS — M797 Fibromyalgia: Secondary | ICD-10-CM

## 2013-11-20 DIAGNOSIS — E785 Hyperlipidemia, unspecified: Secondary | ICD-10-CM

## 2013-11-20 LAB — CBC WITH DIFFERENTIAL (CANCER CENTER ONLY)
BASO#: 0 10*3/uL (ref 0.0–0.2)
BASO%: 0.6 % (ref 0.0–2.0)
EOS%: 3.4 % (ref 0.0–7.0)
Eosinophils Absolute: 0.2 10*3/uL (ref 0.0–0.5)
HCT: 38.2 % (ref 34.8–46.6)
HGB: 12.5 g/dL (ref 11.6–15.9)
LYMPH#: 3.7 10*3/uL — ABNORMAL HIGH (ref 0.9–3.3)
LYMPH%: 53.8 % — ABNORMAL HIGH (ref 14.0–48.0)
MCH: 27.7 pg (ref 26.0–34.0)
MCHC: 32.7 g/dL (ref 32.0–36.0)
MCV: 85 fL (ref 81–101)
MONO#: 0.5 10*3/uL (ref 0.1–0.9)
MONO%: 6.8 % (ref 0.0–13.0)
NEUT#: 2.4 10*3/uL (ref 1.5–6.5)
NEUT%: 35.4 % — ABNORMAL LOW (ref 39.6–80.0)
Platelets: 371 10*3/uL (ref 145–400)
RBC: 4.51 10*6/uL (ref 3.70–5.32)
RDW: 14.4 % (ref 11.1–15.7)
WBC: 6.8 10*3/uL (ref 3.9–10.0)

## 2013-11-20 MED ORDER — INFLUENZA VAC SPLIT QUAD 0.5 ML IM SUSY
0.5000 mL | PREFILLED_SYRINGE | Freq: Once | INTRAMUSCULAR | Status: DC
  Filled 2013-11-20: qty 0.5

## 2013-11-20 NOTE — Progress Notes (Signed)
Hematology and Oncology Follow Up Visit  Linda [name] [id number] 1950/05/23 63 y.o. 11/20/2013   Principle Diagnosis:  Stage IIA (T2 N0 M0) ductal carcinoma of the left breast, triple-negative.  Current Therapy:    Observation     Interim History:  [name] is back for followup. We see her yearly.  Unfortunately, her daughter, who is 29 years old, has breast cancer. She is being treated up in [place]. Looks like she's out of 12th time according to the patient. She is undergoing neoadjuvant chemotherapy in hopes of allowing her to have lumpectomy.  She's not sure if her daughter has been tested for the BRCA gene.  Linda [name], otherwise, feels well. She's had no problems his last saw her. Her last mammogram was done back in April and everything was fine.  She's had no change in bowel or bladder habits. She's had no nausea vomiting. There's been no cough. She's had no rashes. His been no leg swelling. She's had no weight loss or weight gain. She's had no headache.  Overall, her performance status is ECOG 0  Medications: Current outpatient prescriptions:5-Hydroxytryptophan (5-HTP) 100 MG CAPS, Take 1 capsule by mouth at bedtime.  , Disp: , Rfl: ;  aspirin EC 81 MG tablet, Take 1 tablet (81 mg total) by mouth daily., Disp: , Rfl: ;  baclofen (LIORESAL) 10 MG tablet, Take 10 mg by mouth 2 (two) times daily. , Disp: , Rfl: ;  buPROPion (WELLBUTRIN XL) 300 MG 24 hr tablet, Take 300 mg by mouth daily.  , Disp: , Rfl:  dexlansoprazole (DEXILANT) 60 MG capsule, Take 1 capsule (60 mg total) by mouth 2 (two) times daily., Disp: 60 capsule, Rfl: 1;  HYDROcodone-acetaminophen (VICODIN ES) 7.5-750 MG per tablet, Take 1 tablet by mouth daily as needed for pain. , Disp: , Rfl: ;  Nutritional Supplements (MELATONIN PO), Take 1 capsule by mouth at bedtime.  , Disp: , Rfl:  promethazine (PHENERGAN) 25 MG tablet, Take 25 mg by mouth daily as needed. For nausea, Disp: , Rfl: ;  traMADol (ULTRAM)  50 MG tablet, Take 50 mg by mouth every 6 (six) hours as needed. For pain, Disp: , Rfl:  Current facility-administered medications:Influenza vac split quadrivalent PF (FLUARIX) injection 0.5 mL, 0.5 mL, Intramuscular, Once, Volanda Napoleon, MD  Allergies: No Known Allergies  Past Medical History, Surgical history, Social history, and Family History were reviewed and updated.  Review of Systems: As above  Physical Exam:  height is $RemoveB'5\' 2"'dFogEbqA$  (1.575 m) and weight is 160 lb (72.576 kg). Her oral temperature is 98.4 F (36.9 C). Her blood pressure is 143/75 and her pulse is 102. Her respiration is 14.   Well-developed and well-nourished African American female. Head and neck exam shows no ocular or oral lesions. She has no palpable cervical or supraclavicular lymph nodes. Lungs are clear. Cardiac exam regular in rhythm with no murmurs, rubs or bruits. Abdomen is soft. She's good bowel sounds. There is no fluid wave. There is no palpable liver or spleen tip. Breast exam shows right breast no masses, edema or erythema. There is no right axillary adenopathy. Left breast she is slightly contracted. There is a well-healed lumpectomy at the 2:00 position. She has no distinct mass in the left breast. There's no left axillary adenopathy. Back exam shows no kyphosis or osteoporotic changes. There is no tenderness over the spine, ribs or hips. Extremities shows no clubbing, cyanosis or edema. No lymphedema is noted in the left arm. She  has good range of motion of her joints. Neurological exam is non-focal.  Lab Results  Component Value Date   WBC 6.8 11/20/2013   HGB 12.5 11/20/2013   HCT 38.2 11/20/2013   MCV 85 11/20/2013   PLT 371 11/20/2013     Chemistry      Component Value Date/Time   NA 137 11/11/2012 1532   K 3.5 11/11/2012 1532   CL 101 11/11/2012 1532   CO2 26 11/11/2012 1532   BUN 13 11/11/2012 1532   CREATININE 0.87 11/11/2012 1532      Component Value Date/Time   CALCIUM 9.5 11/11/2012  1532   ALKPHOS 75 11/11/2012 1532   AST 15 11/11/2012 1532   ALT 16 11/11/2012 1532   BILITOT 0.3 11/11/2012 1532         Impression and Plan: Linda [name] is 63 year old Afro-American female with a history of   stage IIA-noted negative-ductal carcinoma of the left breast. Her tumor was triple negative. She received adjuvant chemotherapy with Adriamycin/Cytoxan.   I do not see any evidence of recurrence. It's now been 13 years since she completed treatment for her breast cancer.    We will certainly pray hard for her daughter.   I forgot to mention that she did have a cervical epidural injection back in March. This helped with her chronic neck pain.  We will see her back in one year.  Volanda Napoleon, MD 10/26/20155:21 PM

## 2013-11-21 LAB — COMPREHENSIVE METABOLIC PANEL
ALT: 26 U/L (ref 0–35)
AST: 18 U/L (ref 0–37)
Albumin: 4.3 g/dL (ref 3.5–5.2)
Alkaline Phosphatase: 64 U/L (ref 39–117)
BUN: 15 mg/dL (ref 6–23)
CO2: 27 mEq/L (ref 19–32)
Calcium: 9.8 mg/dL (ref 8.4–10.5)
Chloride: 102 mEq/L (ref 96–112)
Creatinine, Ser: 0.69 mg/dL (ref 0.50–1.10)
Glucose, Bld: 155 mg/dL — ABNORMAL HIGH (ref 70–99)
Potassium: 4.1 mEq/L (ref 3.5–5.3)
Sodium: 138 mEq/L (ref 135–145)
Total Bilirubin: 0.3 mg/dL (ref 0.2–1.2)
Total Protein: 6.9 g/dL (ref 6.0–8.3)

## 2013-11-21 LAB — VITAMIN D 25 HYDROXY (VIT D DEFICIENCY, FRACTURES): Vit D, 25-Hydroxy: 39 ng/mL (ref 30–89)

## 2013-11-22 ENCOUNTER — Telehealth: Payer: Self-pay | Admitting: Hematology & Oncology

## 2013-11-22 NOTE — Telephone Encounter (Signed)
Mailed 10-2014 schedule

## 2013-11-27 ENCOUNTER — Encounter: Payer: Self-pay | Admitting: Hematology & Oncology

## 2013-12-18 ENCOUNTER — Other Ambulatory Visit: Payer: BC Managed Care – PPO

## 2013-12-18 ENCOUNTER — Ambulatory Visit (HOSPITAL_BASED_OUTPATIENT_CLINIC_OR_DEPARTMENT_OTHER): Payer: BC Managed Care – PPO | Admitting: Genetic Counselor

## 2013-12-18 DIAGNOSIS — Z315 Encounter for genetic counseling: Secondary | ICD-10-CM

## 2013-12-18 DIAGNOSIS — C50912 Malignant neoplasm of unspecified site of left female breast: Secondary | ICD-10-CM

## 2013-12-18 DIAGNOSIS — Z853 Personal history of malignant neoplasm of breast: Secondary | ICD-10-CM

## 2013-12-18 DIAGNOSIS — Z803 Family history of malignant neoplasm of breast: Secondary | ICD-10-CM

## 2013-12-18 NOTE — Progress Notes (Addendum)
Dr.  Burney Gauze requested a consultation for genetic counseling and risk assessment for [facility], a 63 y.o. female, for discussion of her personal and family history of breast cancer. Her daugthter was diagnosed with breast cancer and had genetic testing in [place], [place] which is reportedly inconclusive.  It was suggested that Ms. Mohs undergo genetic testing as well.   She presents to clinic today to discuss the possibility of a genetic predisposition to cancer, and to further clarify her risks, as well as her family members' risks for cancer.   HISTORY OF PRESENT ILLNESS: In 2002, at the age of 77, Linda [name] was diagnosed with invasive ductal carcinoma of the breast.The tumor was triple negative. She has had a colonoscopy with no polyps.  She is a previous smoker, with a 1ppd habit for 25 years. She quit in 1985, about 30 years ago.        Past Medical History  Diagnosis Date  . Hyperlipidemia   . Fibromyalgia   . Allergic rhinitis   . Breast cancer 2002    left    Past Surgical History  Procedure Laterality Date  . Cholecystectomy  1989  . Vaginal hysterectomy  1987  . Appendectomy  1989  . Breast lumpectomy  2002    left    History   Social History  . Marital Status: Married    Spouse Name: N/A    Number of Children: N/A  . Years of Education: N/A   Social History Main Topics  . Smoking status: Former Smoker -- 1.50 packs/day for 25 years    Types: Cigarettes    Start date: 04/20/1973    Quit date: 01/27/1988  . Smokeless tobacco: Never Used     Comment: quit smoking 15 years ago  . Alcohol Use: No  . Drug Use: No  . Sexual Activity:    Partners: Male   Other Topics Concern  . Not on file   Social History Narrative   ONCOLOGY HISTORY:  No history exists.    REPRODUCTIVE HISTORY AND PERSONAL RISK ASSESSMENT FACTORS: Menarche was at age 78.   postmenopausal Uterus Intact: no Ovaries Intact: yes G3P3A0, first live birth at age 13  She has  not previously undergone treatment for infertility.   Oral Contraceptive use: 8 years   She has not used HRT in the past.    FAMILY HISTORY:  We obtained a detailed, 4-generation family history.  Significant diagnoses are listed below: Family History  Problem Relation Age of Onset  . Lung cancer Father   . Breast cancer Maternal Aunt   . CAD Mother   . Breast cancer Daughter     Patient's maternal ancestors are of [place] American descent, and paternal ancestors are of [place], [place] and [place] descent. There is no reported Ashkenazi Jewish ancestry. There is no known consanguinity.  GENETIC COUNSELING ASSESSMENT: Malala Trenkamp is a 63 y.o. female with a personal and family history of breast cancer which somewhat suggestive of a hereditary cancer syndrome and predisposition to cancer. We, therefore, discussed and recommended the following at today's visit.   DISCUSSION: We reviewed the characteristics, features and inheritance patterns of hereditary cancer syndromes. We also discussed genetic testing, including the appropriate family members to test, the process of testing, insurance coverage and turn-around-time for results. We discussed that 5-10% of breast cancer is hereditary, and reviewed the risks that suggest that it is hereditary including young age of onset, multiple generations, and multiple cancers in  the family that are related.  Her daughter had genetic testing that is reportedly inconclusive, but we do not have a report on this.  We discussed that most likely her inconclusive result was a VUS.    Ms. Townsel' breast cancer was triple negative.  We reviewed hereditary cancer syndromes that increase the risk for triple negative breast cancer including BRCA mutations, PALB2 mutations as well as others.  Her [place] ancestry was discussed, as there is a founder mutation in this population in the BRCA genes, that will be important to look at.  We reviewed hereditary  cancer panels that can test multiple genes at the same time.   Ms. Hine's daughter will try to get [place] a copy of her test result.  PLAN: After considering the risks, benefits, and limitations, Qu[place]alli Clos provided informed consent to pursue genetic testing and the blood sample will be sent to [facility] for analysis of the BreastNext panel test. We discussed the implications of a positive, negative and/ or variant of uncertain significance genetic test result. Results should be available within approximately 3-4 weeks' time, at which point they will be disclosed by telephone to [facility], as will any additional recommendations warranted by these results. Kacie Huxtable will receive a summary of her genetic counseling visit and a copy of her results once available. This information will also be available in Epic. We encouraged Taraneh Metheney to remain in contact with cancer genetics annually so that we can continuously update the family history and inform her of any changes in cancer genetics and testing that may be of benefit for her family. Ismerai Hausman's questions were answered to her satisfaction today. Our contact information was provided should additional questions or concerns arise.  The patient was seen for a total of 60 minutes, greater than 50% of which was spent face-to-face counseling.  This note will also be sent to the referring provider via the electronic medical record. The patient will be supplied with a summary of this genetic counseling discussion as well as educational information on the discussed hereditary cancer syndromes following the conclusion of their visit.   This plan was reviewed by Drs. Magrinat, Lindi Adie and Spofford and they agree with the plan.  _______________________________________________________________________ For Office Staff:  Number of people involved in session: 1 Was an Intern/ student involved with case: no

## 2014-01-11 ENCOUNTER — Encounter: Payer: Self-pay | Admitting: Genetic Counselor

## 2014-01-11 ENCOUNTER — Telehealth: Payer: Self-pay | Admitting: Genetic Counselor

## 2014-01-11 DIAGNOSIS — Z1379 Encounter for other screening for genetic and chromosomal anomalies: Secondary | ICD-10-CM | POA: Insufficient documentation

## 2014-01-11 NOTE — Progress Notes (Signed)
HPI: Ms. Linda [name] was previously seen in the [place] clinic due to a personal and family history of cancer, genetic testing on her daughter which found an ATM and BRCA1 VUS, and concerns regarding a hereditary predisposition to cancer. Please refer to our prior cancer genetics clinic note for more information regarding Ms. Linda [name]'s medical, social and family histories, and our assessment and recommendations, at the time. Ms. Linda [name] recent genetic test results were disclosed to her, as were recommendations warranted by these results. These results and recommendations are discussed in more detail below.  GENETIC TEST RESULTS: At the time of Ms. Linda [name]'s visit, we recommended she pursue genetic testing of the BreastNext gene panel. This test, which included sequencing and deletion/duplication analysis of the following genes:  ATM, BARD1, BRCA1, BRCA2, BRIP1, CDH1, CHEK2, MRE11A, MUTYH,, NBN, NF1, PALB2, PTEN, RAD50, RAD51C, RAD51D and TP53.  The report date is 01/10/14.  Testing was performed at [facility]. Genetic testing was normal, and did not reveal a deleterious mutation in these genes. The test report has been scanned into EPIC and is located under the Media tab. We discussed that she is negative for the ATM and BRCA1 VUS found in her daughter, and therefore those changes most likely came from her daughter's father.  Additionally, Ms. Linda [name] is negative for the BRCA2 Portugese founder mutation.  We discussed with Ms. Linda [name] that since the current genetic testing is not perfect, it is possible there may be a gene mutation in one of these genes that current testing cannot detect, but that chance is small. We also discussed, that it is possible that another gene that has not yet been discovered, or that we have not yet tested, is responsible for the cancer diagnoses in the family, and it is, therefore, important to remain in touch with cancer genetics in the future so that we can  continue to offer Ms. Linda [name] the most up to date genetic testing.   CANCER SCREENING RECOMMENDATIONS: This result is reassuring and suggests that Ms. Linda [name]'s cancer was most likely not due to an inherited predisposition associated with one of these genes. Most cancers happen by chance and this negative test, along with details of her family history, suggests that her cancer falls into this category. We, therefore, recommended she continue to follow the cancer management and screening guidelines provided by her oncology and primary providers.   RECOMMENDATIONS FOR FAMILY MEMBERS: Women in this family might be at some increased risk of developing cancer, over the general population risk, simply due to the family history of cancer. We recommended women in this family have a yearly mammogram beginning at age 51, an an annual clinical breast exam, and perform monthly breast self-exams. Women in this family should also have a gynecological exam as recommended by their primary provider. All family members should have a colonoscopy by age 55.  FOLLOW-UP: Lastly, we discussed with Ms. Linda [name] that cancer genetics is a rapidly advancing field and it is possible that new genetic tests will be appropriate for her and/or her family members in the future. We encouraged her to remain in contact with cancer genetics on an annual basis so we can update her personal and family histories and let her know of advances in cancer genetics that may benefit this family. Ms. Linda [name] received a copy of her test results via secure email.  Our contact number was provided. Ms.. Linda [name]'s questions were answered to her satisfaction, and she knows she is welcome to call us  at anytime with additional questions or concerns.   Roma Kayser, MS, [facility] Certified Genetic Counselor [email or web address]

## 2014-01-11 NOTE — Telephone Encounter (Signed)
Revealed negative genetic testing.    

## 2014-04-11 ENCOUNTER — Other Ambulatory Visit: Payer: Self-pay

## 2014-04-11 DIAGNOSIS — Z1231 Encounter for screening mammogram for malignant neoplasm of breast: Secondary | ICD-10-CM

## 2014-04-11 DIAGNOSIS — Z853 Personal history of malignant neoplasm of breast: Secondary | ICD-10-CM

## 2014-04-11 DIAGNOSIS — Z9889 Other specified postprocedural states: Secondary | ICD-10-CM

## 2014-05-24 ENCOUNTER — Ambulatory Visit
Admission: RE | Admit: 2014-05-24 | Discharge: 2014-05-24 | Disposition: A | Discharge status: Home / Self Care | Payer: BC Managed Care – PPO | Source: Ambulatory Visit

## 2014-05-24 DIAGNOSIS — Z1231 Encounter for screening mammogram for malignant neoplasm of breast: Secondary | ICD-10-CM

## 2014-05-24 DIAGNOSIS — Z853 Personal history of malignant neoplasm of breast: Secondary | ICD-10-CM

## 2014-05-24 DIAGNOSIS — Z9889 Other specified postprocedural states: Secondary | ICD-10-CM

## 2014-05-29 ENCOUNTER — Other Ambulatory Visit: Payer: Self-pay | Admitting: Orthopedic Surgery

## 2014-05-29 DIAGNOSIS — M5412 Radiculopathy, cervical region: Secondary | ICD-10-CM

## 2014-05-30 ENCOUNTER — Other Ambulatory Visit: Payer: Self-pay | Admitting: Internal Medicine

## 2014-06-01 ENCOUNTER — Ambulatory Visit
Admission: RE | Admit: 2014-06-01 | Discharge: 2014-06-01 | Disposition: A | Discharge status: Home / Self Care | Payer: BC Managed Care – PPO | Source: Ambulatory Visit | Attending: Orthopedic Surgery | Admitting: Orthopedic Surgery

## 2014-06-01 DIAGNOSIS — M5412 Radiculopathy, cervical region: Secondary | ICD-10-CM

## 2014-08-28 ENCOUNTER — Other Ambulatory Visit: Payer: Self-pay | Admitting: Orthopedic Surgery

## 2014-08-28 DIAGNOSIS — M5030 Other cervical disc degeneration, unspecified cervical region: Secondary | ICD-10-CM

## 2014-10-02 ENCOUNTER — Encounter: Payer: Self-pay | Admitting: Obstetrics

## 2014-10-02 ENCOUNTER — Ambulatory Visit (INDEPENDENT_AMBULATORY_CARE_PROVIDER_SITE_OTHER): Payer: BC Managed Care – PPO | Admitting: Obstetrics

## 2014-10-02 VITALS — BP 145/85 | HR 96 | Ht 62.0 in | Wt 154.8 lb

## 2014-10-02 DIAGNOSIS — Z780 Asymptomatic menopausal state: Secondary | ICD-10-CM | POA: Diagnosis not present

## 2014-10-02 DIAGNOSIS — Z01419 Encounter for gynecological examination (general) (routine) without abnormal findings: Secondary | ICD-10-CM

## 2014-10-05 ENCOUNTER — Encounter: Payer: Self-pay | Admitting: Obstetrics

## 2014-10-05 LAB — SURESWAB BACTERIAL VAGINOSIS/ITIS
Atopobium vaginae: NOT DETECTED Log (cells/mL)
C. albicans, DNA: NOT DETECTED
C. glabrata, DNA: NOT DETECTED
C. parapsilosis, DNA: NOT DETECTED
C. tropicalis, DNA: NOT DETECTED
Gardnerella vaginalis: NOT DETECTED Log (cells/mL)
LACTOBACILLUS SPECIES: DETECTED Log (cells/mL)
MEGASPHAERA SPECIES: NOT DETECTED Log (cells/mL)
T. vaginalis RNA, QL TMA: NOT DETECTED

## 2014-10-05 NOTE — Progress Notes (Signed)
Subjective:        Linda [name] is a 64 y.o. female here for a routine exam.  Current complaints: None.    Personal health questionnaire:  Is patient Ashkenazi Jewish, have a family history of breast and/or ovarian cancer: no Is there a family history of uterine cancer diagnosed at age < 30, gastrointestinal cancer, urinary tract cancer, family member who is a Field seismologist syndrome-associated carrier: no Is the patient overweight and hypertensive, family history of diabetes, personal history of gestational diabetes, preeclampsia or PCOS: no Is patient over 43, have PCOS,  family history of premature CHD under age 15, diabetes, smoke, have hypertension or peripheral artery disease:  no At any time, has a partner hit, kicked or otherwise hurt or frightened you?: no Over the past 2 weeks, have you felt down, depressed or hopeless?: no Over the past 2 weeks, have you felt little interest or pleasure in doing things?:no   Gynecologic History No LMP recorded. Patient has had a hysterectomy. Contraception: status post hysterectomy Last Pap: n/a. Results were: normal Last mammogram: 2016. Results were: normal  Obstetric History OB History  Gravida Para Term Preterm AB SAB TAB Ectopic Multiple Living  3 3 3       3     # Outcome Date GA Lbr Len/2nd Weight Sex Delivery Anes PTL Lv  3 Term         Y  2 Term         Y  1 Term         Y      Past Medical History  Diagnosis Date  . Hyperlipidemia   . Fibromyalgia   . Allergic rhinitis   . Breast cancer 2002    left    Past Surgical History  Procedure Laterality Date  . Cholecystectomy  1989  . Vaginal hysterectomy  1987  . Appendectomy  1989  . Breast lumpectomy  2002    left     Current outpatient prescriptions:  .  aspirin EC 81 MG tablet, Take 1 tablet (81 mg total) by mouth daily., Disp: , Rfl:  .  baclofen (LIORESAL) 10 MG tablet, Take 10 mg by mouth 2 (two) times daily. , Disp: , Rfl:  .  buPROPion (WELLBUTRIN XL) 300 MG  24 hr tablet, Take 300 mg by mouth daily.  , Disp: , Rfl:  .  HYDROcodone-acetaminophen (VICODIN ES) 7.5-750 MG per tablet, Take 1 tablet by mouth daily as needed for pain. , Disp: , Rfl:  .  Nutritional Supplements (MELATONIN PO), Take 1 capsule by mouth at bedtime.  , Disp: , Rfl:  .  promethazine (PHENERGAN) 25 MG tablet, Take 25 mg by mouth daily as needed. For nausea, Disp: , Rfl:  .  traMADol (ULTRAM) 50 MG tablet, Take 50 mg by mouth every 6 (six) hours as needed. For pain, Disp: , Rfl:  .  5-Hydroxytryptophan (5-HTP) 100 MG CAPS, Take 1 capsule by mouth at bedtime.  , Disp: , Rfl:  .  dexlansoprazole (DEXILANT) 60 MG capsule, Take 1 capsule (60 mg total) by mouth 2 (two) times daily. (Patient not taking: Reported on 10/02/2014), Disp: 60 capsule, Rfl: 1 Allergies  Allergen Reactions  . Sulfa Antibiotics Rash    Social History  Substance Use Topics  . Smoking status: Former Smoker -- 1.50 packs/day for 25 years    Types: Cigarettes    Start date: 04/20/1973    Quit date: 01/27/1988  . Smokeless tobacco: Never Used  Comment: quit smoking 15 years ago  . Alcohol Use: No    Family History  Problem Relation Age of Onset  . Lung cancer Father   . Breast cancer Maternal Aunt   . CAD Mother   . Breast cancer Daughter       Review of Systems  Constitutional: negative for fatigue and weight loss Respiratory: negative for cough and wheezing Cardiovascular: negative for chest pain, fatigue and palpitations Gastrointestinal: negative for abdominal pain and change in bowel habits Musculoskeletal:negative for myalgias Neurological: negative for gait problems and tremors Behavioral/Psych: negative for abusive relationship, depression Endocrine: negative for temperature intolerance   Genitourinary:negative for abnormal menstrual periods, genital lesions, hot flashes, sexual problems and vaginal discharge Integument/breast: negative for breast lump, breast tenderness, nipple discharge  and skin lesion(s)    Objective:       BP 145/85 mmHg  Pulse 96  Ht 5\' 2"  (1.575 m)  Wt 154 lb 12.8 oz (70.217 kg)  BMI 28.31 kg/m2 General:   alert  Skin:   no rash or abnormalities  Lungs:   clear to auscultation bilaterally  Heart:   regular rate and rhythm, S1, S2 normal, no murmur, click, rub or gallop  Breasts:   normal without suspicious masses, skin or nipple changes or axillary nodes  Abdomen:  normal findings: no organomegaly, soft, non-tender and no hernia  Pelvis:  External genitalia: normal general appearance Urinary system: urethral meatus normal and bladder without fullness, nontender Vaginal: normal without tenderness, induration or masses Cervix: absent Adnexa: normal bimanual exam Uterus: absent   Lab Review Urine pregnancy test Labs reviewed yes Radiologic studies reviewed yes    Assessment:    Healthy female exam.    Postmenopausal.  S/P Hysterectomy.  Doing well.   Plan:    Education reviewed: calcium supplements, depression evaluation, low fat, low cholesterol diet, self breast exams and weight bearing exercise. Contraception: status post hysterectomy. Follow up in: 1 year.   No orders of the defined types were placed in this encounter.   Orders Placed This Encounter  Procedures  . SureSwab Bacterial Vaginosis/itis

## 2014-10-18 ENCOUNTER — Ambulatory Visit
Admission: RE | Admit: 2014-10-18 | Discharge: 2014-10-18 | Disposition: A | Discharge status: Home / Self Care | Payer: BC Managed Care – PPO | Source: Ambulatory Visit | Attending: Orthopedic Surgery | Admitting: Orthopedic Surgery

## 2014-10-18 VITALS — BP 145/55 | HR 94

## 2014-10-18 DIAGNOSIS — M5030 Other cervical disc degeneration, unspecified cervical region: Secondary | ICD-10-CM

## 2014-10-18 DIAGNOSIS — M797 Fibromyalgia: Secondary | ICD-10-CM

## 2014-10-18 MED ORDER — IOHEXOL 300 MG/ML  SOLN
1.0000 mL | Freq: Once | INTRAMUSCULAR | Status: DC | PRN
  Administered 2014-10-18: 1 mL via EPIDURAL

## 2014-10-18 MED ORDER — TRIAMCINOLONE ACETONIDE 40 MG/ML IJ SUSP (RADIOLOGY)
60.0000 mg | Freq: Once | INTRAMUSCULAR | Status: AC
  Administered 2014-10-18: 60 mg via EPIDURAL

## 2014-10-18 NOTE — Discharge Instructions (Signed)

## 2014-11-06 ENCOUNTER — Telehealth: Payer: Self-pay | Admitting: Hematology & Oncology

## 2014-11-06 NOTE — Telephone Encounter (Signed)
Per Md to cx 11/19/14 appt due to being out of office.  appt was resch for same day 11/19/14 with sarah at 8:45am.  Patient was called to be given new apt and date and time.  No answer, so i left detailed message and asked patient to call back.  Calendar was mailed out to patient's home

## 2014-11-19 ENCOUNTER — Other Ambulatory Visit: Payer: BC Managed Care – PPO

## 2014-11-19 ENCOUNTER — Ambulatory Visit: Payer: BC Managed Care – PPO | Admitting: Hematology & Oncology

## 2014-11-19 ENCOUNTER — Other Ambulatory Visit (HOSPITAL_BASED_OUTPATIENT_CLINIC_OR_DEPARTMENT_OTHER): Payer: BC Managed Care – PPO

## 2014-11-19 ENCOUNTER — Ambulatory Visit (HOSPITAL_BASED_OUTPATIENT_CLINIC_OR_DEPARTMENT_OTHER): Payer: BC Managed Care – PPO | Admitting: Family

## 2014-11-19 ENCOUNTER — Encounter: Payer: Self-pay | Admitting: Family

## 2014-11-19 VITALS — BP 146/80 | HR 100 | Temp 98.0°F | Resp 16 | Ht 62.0 in | Wt 160.0 lb

## 2014-11-19 DIAGNOSIS — Z853 Personal history of malignant neoplasm of breast: Secondary | ICD-10-CM

## 2014-11-19 DIAGNOSIS — E559 Vitamin D deficiency, unspecified: Secondary | ICD-10-CM

## 2014-11-19 DIAGNOSIS — E785 Hyperlipidemia, unspecified: Secondary | ICD-10-CM

## 2014-11-19 DIAGNOSIS — M797 Fibromyalgia: Secondary | ICD-10-CM

## 2014-11-19 DIAGNOSIS — C50912 Malignant neoplasm of unspecified site of left female breast: Secondary | ICD-10-CM

## 2014-11-19 DIAGNOSIS — Z23 Encounter for immunization: Secondary | ICD-10-CM

## 2014-11-19 LAB — CBC WITH DIFFERENTIAL (CANCER CENTER ONLY)
BASO#: 0 10*3/uL (ref 0.0–0.2)
BASO%: 0.5 % (ref 0.0–2.0)
EOS%: 2.4 % (ref 0.0–7.0)
Eosinophils Absolute: 0.2 10*3/uL (ref 0.0–0.5)
HCT: 38.9 % (ref 34.8–46.6)
HGB: 12.8 g/dL (ref 11.6–15.9)
LYMPH#: 4.6 10*3/uL — ABNORMAL HIGH (ref 0.9–3.3)
LYMPH%: 54.6 % — ABNORMAL HIGH (ref 14.0–48.0)
MCH: 28 pg (ref 26.0–34.0)
MCHC: 32.9 g/dL (ref 32.0–36.0)
MCV: 85 fL (ref 81–101)
MONO#: 0.5 10*3/uL (ref 0.1–0.9)
MONO%: 6 % (ref 0.0–13.0)
NEUT#: 3.1 10*3/uL (ref 1.5–6.5)
NEUT%: 36.5 % — ABNORMAL LOW (ref 39.6–80.0)
Platelets: 354 10*3/uL (ref 145–400)
RBC: 4.57 10*6/uL (ref 3.70–5.32)
RDW: 15 % (ref 11.1–15.7)
WBC: 8.4 10*3/uL (ref 3.9–10.0)

## 2014-11-19 LAB — COMPREHENSIVE METABOLIC PANEL (CC13)
ALT: 33 U/L (ref 0–55)
AST: 19 U/L (ref 5–34)
Albumin: 3.6 g/dL (ref 3.5–5.0)
Alkaline Phosphatase: 79 U/L (ref 40–150)
Anion Gap: 8 mEq/L (ref 3–11)
BUN: 9.4 mg/dL (ref 7.0–26.0)
CO2: 28 mEq/L (ref 22–29)
Calcium: 9.8 mg/dL (ref 8.4–10.4)
Chloride: 107 mEq/L (ref 98–109)
Creatinine: 0.8 mg/dL (ref 0.6–1.1)
EGFR: >90 mL/min/{1.73_m2} (ref >90)
Glucose: 223 mg/dl — ABNORMAL HIGH (ref 70–140)
Potassium: 4.3 mEq/L (ref 3.5–5.1)
Sodium: 143 mEq/L (ref 136–145)
Total Bilirubin: <0.3 mg/dL (ref 0.20–1.20)
Total Protein: 6.9 g/dL (ref 6.4–8.3)

## 2014-11-19 MED ORDER — INFLUENZA VAC SPLIT QUAD 0.5 ML IM SUSY
0.5000 mL | PREFILLED_SYRINGE | Freq: Once | INTRAMUSCULAR | Status: DC
  Filled 2014-11-19: qty 0.5

## 2014-11-20 ENCOUNTER — Other Ambulatory Visit: Payer: Self-pay | Admitting: Family

## 2014-11-20 LAB — VITAMIN D 25 HYDROXY (VIT D DEFICIENCY, FRACTURES): Vit D, 25-Hydroxy: 19 ng/mL — ABNORMAL LOW (ref 30–100)

## 2014-11-20 MED ORDER — ERGOCALCIFEROL 1.25 MG (50000 UT) PO CAPS: 50000.0000 [IU] | ORAL_CAPSULE | ORAL | Status: DC

## 2014-11-21 NOTE — Progress Notes (Signed)
Hematology and Oncology Follow Up Visit  Linda [name] [id number] Jun 09, 1950 64 y.o. 11/21/2014   Principle Diagnosis:  Stage IIA (T2 N0 M0) ductal carcinoma of the left breast triple-negative  Current Therapy:   Observation    Interim History:  Linda [name] is here today for her annual follow-up. She is doing well and states that her daughter has responded nicely to treatment and is also getting along nicely.  She is still having chronic neck pain and continues to have epidural injections periodically in her neck.  She continues to do self breast exams at home. Her breast exam today was negative. She had her mammogram in April and there was no evidence of malignancy at that time.  She has had no pain. No swelling, tenderness, numbness or tingling in her extremities.  No fever, chills, n/v, cough, rash, dizziness, SOB, chest pain, palpitations, abdominal pain or changes in bowel or bladder habits.  She has a good appetite and is staying hydrated. Her weight is stable.   Medications:    Medication List       This list is accurate as of: 11/19/14 11:59 PM.  Always use your most recent med list.               5-HTP 100 MG Caps  Take 1 capsule by mouth at bedtime.     aspirin EC 81 MG tablet  Take 1 tablet (81 mg total) by mouth daily.     baclofen 10 MG tablet  Commonly known as:  LIORESAL  Take 10 mg by mouth 2 (two) times daily.     buPROPion 300 MG 24 hr tablet  Commonly known as:  WELLBUTRIN XL  Take 300 mg by mouth daily.     [employer] ALLERGY RELIEF D 60-120 MG 12 hr tablet  Generic drug:  fexofenadine-pseudoephedrine  Take 1 tablet by mouth 2 (two) times daily.     dexamethasone 4 MG tablet  Commonly known as:  DECADRON  Take 4 mg by mouth daily.     etodolac 400 MG tablet  Commonly known as:  LODINE  Take 400 mg by mouth 2 (two) times daily.     fluticasone 0.05 % cream  Commonly known as:  CUTIVATE     fluticasone 50 MCG/ACT nasal spray  Commonly known as:   FLONASE  USE 2 SPRAYS IN EACH NOSTRIL AT NIGHT     HYDROcodone-acetaminophen 10-325 MG tablet  Commonly known as:  NORCO  Take 1 tablet by mouth every 8 (eight) hours as needed. for pain     MELATONIN PO  Take 1 capsule by mouth at bedtime.     promethazine 25 MG tablet  Commonly known as:  PHENERGAN  Take 25 mg by mouth daily as needed. For nausea     zolpidem 10 MG tablet  Commonly known as:  AMBIEN  Take 10 mg by mouth at bedtime as needed. for sleep        Allergies:  Allergies  Allergen Reactions  . Sulfa Antibiotics Rash    Past Medical History, Surgical history, Social history, and Family History were reviewed and updated.  Review of Systems: All other 10 point review of systems is negative.   Physical Exam:  height is 5\' 2"  (1.575 m) and weight is 160 lb (72.576 kg). Her oral temperature is 98 F (36.7 C). Her blood pressure is 146/80 and her pulse is 100. Her respiration is 16.   Wt Readings from Last 3 Encounters:  11/19/14  160 lb (72.576 kg)  10/02/14 154 lb 12.8 oz (70.217 kg)  06/01/14 158 lb (71.668 kg)    Ocular: Sclerae unicteric, pupils equal, round and reactive to light Ear-nose-throat: Oropharynx clear, dentition fair Lymphatic: No cervical, supraclavicular or axillary adenopathy Lungs no rales or rhonchi, good excursion bilaterally Heart regular rate and rhythm, no murmur appreciated Abd soft, nontender, positive bowel sounds, no organomegally MSK no focal spinal tenderness, no joint edema Neuro: non-focal, well-oriented, appropriate affect Breasts: No changes. Left breast lumpectomy well healed. No mass, lesion, rash or lymphadenopathy found on exam.   Lab Results  Component Value Date   WBC 8.4 11/19/2014   HGB 12.8 11/19/2014   HCT 38.9 11/19/2014   MCV 85 11/19/2014   PLT 354 11/19/2014   Lab Results  Component Value Date   FERRITIN 25 04/23/2012   IRON 64 04/23/2012   TIBC 372 04/23/2012   UIBC 308 04/23/2012   IRONPCTSAT 17*  04/23/2012   Lab Results  Component Value Date   RETICCTPCT 1.3 04/23/2012   RBC 4.57 11/19/2014   No results found for: KPAFRELGTCHN, LAMBDASER, KAPLAMBRATIO No results found for: Kandis Cocking, IGMSERUM No results found for: Odetta Pink, SPEI   Chemistry      Component Value Date/Time   NA 143 11/19/2014 0921   NA 138 11/20/2013 1446   K 4.3 11/19/2014 0921   K 4.1 11/20/2013 1446   CL 102 11/20/2013 1446   CO2 28 11/19/2014 0921   CO2 27 11/20/2013 1446   BUN 9.4 11/19/2014 0921   BUN 15 11/20/2013 1446   CREATININE 0.8 11/19/2014 0921   CREATININE 0.69 11/20/2013 1446      Component Value Date/Time   CALCIUM 9.8 11/19/2014 0921   CALCIUM 9.8 11/20/2013 1446   ALKPHOS 79 11/19/2014 0921   ALKPHOS 64 11/20/2013 1446   AST 19 11/19/2014 0921   AST 18 11/20/2013 1446   ALT 33 11/19/2014 0921   ALT 26 11/20/2013 1446   BILITOT <0.30 11/19/2014 0921   BILITOT 0.3 11/20/2013 1446     Impression and Plan: Linda [name] is a 64 yo African American female with history of stage IIA (T2 N0 M0) ductal carcinoma of the left breast triple-negative. She was diagnosed and treated over 14 years ago. She had a lumpectomy followed by chemotherapy with Adriamycin/Cytoxan.  So far there has been no evidence of recurrence. She is doing well and has no complaints at this time.  She will be due again for a mammogram in April 2017.  Her vitamin D level was low so I gave her a prescription for Vitamin D 50,000 units once a week.  We will plan to see her back in 1 years for labs and follow-up.  She will contact us with any questions or concerns. We can certainly see her sooner if need be.   Eliezer Bottom, NP 10/26/20169:45 AM

## 2015-01-29 ENCOUNTER — Ambulatory Visit: Payer: BC Managed Care – PPO | Attending: Neurosurgery | Admitting: Physical Therapy

## 2015-01-29 DIAGNOSIS — M5030 Other cervical disc degeneration, unspecified cervical region: Secondary | ICD-10-CM

## 2015-01-29 DIAGNOSIS — M542 Cervicalgia: Secondary | ICD-10-CM

## 2015-01-29 DIAGNOSIS — M2560 Stiffness of unspecified joint, not elsewhere classified: Secondary | ICD-10-CM | POA: Diagnosis present

## 2015-01-29 NOTE — Therapy (Signed)
[place], [place], [address] Phone: [phone]   Fax:  [phone]  Physical Therapy Evaluation / Discharge Summary  Patient Details  Name: Linda [name] MRN: [id number] Date of Birth: 10-05-1950 Referring Provider: Marylyn Ishihara L. Christella Noa, MD  Encounter Date: 01/29/2015      PT End of Session - 01/29/15 1253    Visit Number 1   Number of Visits 1   PT Start Time [id number]   PT Stop Time [id number]   PT Time Calculation (min) 47 min   Activity Tolerance Patient limited by pain  reports increased pain with grade 2 manual traction   Behavior During Therapy WFL for tasks assessed/performed      Past Medical History  Diagnosis Date  . Hyperlipidemia   . Fibromyalgia   . Allergic rhinitis   . Breast cancer [facility]) 2002    left    Past Surgical History  Procedure Laterality Date  . Cholecystectomy  1989  . Vaginal hysterectomy  1987  . Appendectomy  1989  . Breast lumpectomy  2002    left    There were no vitals filed for this visit.  Visit Diagnosis:  Degenerative disc disease, cervical - Plan: PT plan of care cert/re-cert  Cervical pain - Plan: PT plan of care cert/re-cert  Range of motion deficit - Plan: PT plan of care cert/re-cert      Subjective Assessment - 01/29/15 1204    Subjective Ongoing neck pain in right shoulder and arm. Started over a year ago and originally was intermittant and is now constant. No initial injury but has just gotten worse over time.    Limitations Sitting;Reading;Lifting;Standing;Walking;Writing;House hold activities   How long can you sit comfortably? 3 hours   How long can you stand comfortably? 15-20 minutes   How long can you walk comfortably? 15-20 minutes   Diagnostic tests MRI, X-rays   Patient Stated Goals get something to relieve the pain.    Currently in Pain? Yes   Pain Score 5    Pain Location Neck   Pain Orientation Right   Pain Descriptors / Indicators --   stinging nerve pain   Pain Type Chronic pain   Pain Onset More than a month ago   Pain Frequency Constant   Aggravating Factors  using arm too much, moving head side to side.   Pain Relieving Factors meds, heat, topical ointments   Effect of Pain on Daily Activities limits tolerance to doing activity at home.             [facility] PT Assessment - 01/29/15 0001    Assessment   Medical Diagnosis degenerative disc disease   Referring Provider Marylyn Ishihara L. Christella Noa, MD   Onset Date/Surgical Date --  over 1 year   Next MD Visit 02/21/15   Prior Therapy none   Precautions   Precautions None   Restrictions   Weight Bearing Restrictions No   Balance Screen   Has the patient fallen in the past 6 months No   Prior Function   Level of Independence Independent   Vocation Retired   Associate Professor   Overall Cognitive Status Within Functional Limits for tasks assessed   Observation/Other Assessments   Focus on Therapeutic Outcomes (FOTO)  41% limitation   AROM   Cervical Flexion 64   Cervical Extension 31   Cervical - Right Side Bend 30   Cervical - Left Side Bend 26   Cervical - Right Rotation 31  Cervical - Left Rotation 28   Strength   Overall Strength Comments UE myotomes grossly 5/5 throughout   Special Tests    Special Tests Cervical   Cervical Tests Vertebral Artery Test;other   Vertebral Artery Test    Findings Negative   Comment bilateral   other    Findings Negative   Comment sharp-purser                   [facility] Adult PT Treatment/Exercise - 01/29/15 0001    Manual Therapy   Manual Therapy Manual Traction   Manual therapy comments supine with LEs elevated   Manual Traction grade 2 distraction at viriable angles, patient reports increased pain, did not want to continue with any further traction.                 PT Education - 01/29/15 1252    Education provided Yes   Education Details HEP, cervical traction, cervical spine anatomy   Person(s) Educated  Patient   Methods Explanation;Demonstration;Tactile cues;Verbal cues;Handout   Comprehension Verbalized understanding;Returned demonstration             PT Long Term Goals - 01/29/15 1259    PT LONG TERM GOAL #1   Title Patient is to trial cervical traction.    Status Achieved   PT LONG TERM GOAL #2   Title Patient is to be independent with a HEP.    Status Achieved               Plan - 01/29/15 1255    Clinical Impression Statement Patient is a 65 y.o. female who reports ongoing cervical pain starting over 1 year ago. Upon completion of the evaluaiton the patient reported pain increasing with gently manual traction. Attempted establishing a HEP with gentle isometrics and scapular stabilization exercises. Following the session the patient denies any questions or concerns and agrees with continuing on independently.    Pt will benefit from skilled therapeutic intervention in order to improve on the following deficits Decreased strength;Decreased range of motion;Pain   Rehab Potential Fair   PT Frequency One time visit   PT Treatment/Interventions ADLs/Self Care Home Management;Therapeutic exercise;Manual techniques   PT Next Visit Plan none D/C   PT Home Exercise Plan to continue with HEP   Consulted and Agree with Plan of Care Patient         Problem List Patient Active Problem List   Diagnosis Date Noted  . Genetic testing 01/11/2014  . Hives 11/11/2012  . Chest pain 04/23/2012  . Hyperlipidemia   . Chronic insomnia 01/29/2011  . Perennial allergic rhinitis 01/29/2011  . Fibromyalgia 01/29/2011    Linard Millers, PT 01/29/2015, 1:06 PM  [facility] [address] [place], [place], [address] Phone: [phone]   Fax:  [phone]  Name: DESSENCE OSLER MRN: [id number] Date of Birth: Jul 28, 1950

## 2015-05-13 ENCOUNTER — Other Ambulatory Visit: Payer: Self-pay

## 2015-05-13 DIAGNOSIS — Z1231 Encounter for screening mammogram for malignant neoplasm of breast: Secondary | ICD-10-CM

## 2015-05-27 ENCOUNTER — Ambulatory Visit
Admission: RE | Admit: 2015-05-27 | Discharge: 2015-05-27 | Disposition: A | Discharge status: Home / Self Care | Payer: BC Managed Care – PPO | Source: Ambulatory Visit

## 2015-05-27 DIAGNOSIS — Z1231 Encounter for screening mammogram for malignant neoplasm of breast: Secondary | ICD-10-CM

## 2015-07-18 ENCOUNTER — Other Ambulatory Visit: Payer: Self-pay | Admitting: Family Medicine

## 2015-07-18 ENCOUNTER — Ambulatory Visit
Admission: RE | Admit: 2015-07-18 | Discharge: 2015-07-18 | Disposition: A | Discharge status: Home / Self Care | Payer: BC Managed Care – PPO | Source: Ambulatory Visit | Attending: Family Medicine | Admitting: Family Medicine

## 2015-07-18 DIAGNOSIS — R52 Pain, unspecified: Secondary | ICD-10-CM

## 2015-07-18 DIAGNOSIS — R609 Edema, unspecified: Secondary | ICD-10-CM

## 2015-09-26 DIAGNOSIS — J324 Chronic pansinusitis: Secondary | ICD-10-CM | POA: Insufficient documentation

## 2015-10-03 ENCOUNTER — Encounter: Payer: Self-pay | Admitting: Obstetrics

## 2015-10-03 ENCOUNTER — Ambulatory Visit (INDEPENDENT_AMBULATORY_CARE_PROVIDER_SITE_OTHER): Payer: BC Managed Care – PPO | Admitting: Obstetrics

## 2015-10-03 VITALS — BP 110/70 | HR 106 | Temp 98.5°F | Wt 142.0 lb

## 2015-10-03 DIAGNOSIS — Z01419 Encounter for gynecological examination (general) (routine) without abnormal findings: Secondary | ICD-10-CM | POA: Diagnosis not present

## 2015-10-03 NOTE — Progress Notes (Signed)
Patient ID: RAINAH FLOURNOY, female   DOB: 1950/04/06, 65 y.o.   MRN: [id number]   Subjective:        Linda [name] is a 65 y.o. female here for a routine exam.  Current complaints: None.    Personal health questionnaire:  Is patient Ashkenazi Jewish, have a family history of breast and/or ovarian cancer: no Is there a family history of uterine cancer diagnosed at age < 51, gastrointestinal cancer, urinary tract cancer, family member who is a Field seismologist syndrome-associated carrier: no Is the patient overweight and hypertensive, family history of diabetes, personal history of gestational diabetes, preeclampsia or PCOS: no Is patient over 74, have PCOS,  family history of premature CHD under age 67, diabetes, smoke, have hypertension or peripheral artery disease:  no At any time, has a partner hit, kicked or otherwise hurt or frightened you?: no Over the past 2 weeks, have you felt down, depressed or hopeless?: no Over the past 2 weeks, have you felt little interest or pleasure in doing things?:no   Gynecologic History No LMP recorded. Patient has had a hysterectomy. Contraception: status post hysterectomy Last Pap: prior to Hysterectomy. Results were: normal Last mammogram: 2017. Results were: normal  Obstetric History OB History  Gravida Para Term Preterm AB Living  3 3 3     3   SAB TAB Ectopic Multiple Live Births          3    # Outcome Date GA Lbr Len/2nd Weight Sex Delivery Anes PTL Lv  3 Term         LIV  2 Term         LIV  1 Term         LIV      Past Medical History:  Diagnosis Date  . Allergic rhinitis   . Breast cancer ([place]) 2002   left  . Diabetes ([place])   . Fibromyalgia   . Hyperlipidemia     Past Surgical History:  Procedure Laterality Date  . APPENDECTOMY  1989  . BREAST LUMPECTOMY  2002   left  . CHOLECYSTECTOMY  1989  . VAGINAL HYSTERECTOMY  1987     Current Outpatient Prescriptions:  .  5-Hydroxytryptophan (5-HTP) 100 MG CAPS, Take 1 capsule by  mouth at bedtime.  , Disp: , Rfl:  .  aspirin EC 81 MG tablet, Take 1 tablet (81 mg total) by mouth daily., Disp: , Rfl:  .  baclofen (LIORESAL) 10 MG tablet, Take 10 mg by mouth 2 (two) times daily. , Disp: , Rfl:  .  buPROPion (WELLBUTRIN XL) 300 MG 24 hr tablet, Take 300 mg by mouth daily.  , Disp: , Rfl:  .  clindamycin (CLEOCIN) 300 MG capsule, Take 300 mg by mouth 3 (three) times daily., Disp: , Rfl:  .  [employer] ALLERGY RELIEF D 60-120 MG 12 hr tablet, Take 1 tablet by mouth 2 (two) times daily., Disp: , Rfl: 2 .  ergocalciferol (VITAMIN D2) 50000 UNITS capsule, Take 1 capsule (50,000 Units total) by mouth once a week., Disp: 30 capsule, Rfl: 3 .  fluticasone (FLONASE) 50 MCG/ACT nasal spray, USE 2 SPRAYS IN EACH NOSTRIL AT NIGHT, Disp: , Rfl: 5 .  HYDROcodone-acetaminophen (NORCO) 10-325 MG tablet, Take 1 tablet by mouth every 8 (eight) hours as needed. for pain, Disp: , Rfl: 0 .  Nutritional Supplements (MELATONIN PO), Take 1 capsule by mouth at bedtime.  , Disp: , Rfl:  .  promethazine (PHENERGAN) 25 MG tablet,  Take 25 mg by mouth daily as needed. For nausea, Disp: , Rfl:  .  sitaGLIPtin-metformin (JANUMET) 50-1000 MG tablet, Take 1 tablet by mouth 2 (two) times daily with a meal., Disp: , Rfl:  .  traZODone (DESYREL) 50 MG tablet, Take 50 mg by mouth at bedtime., Disp: , Rfl:  .  zolpidem (AMBIEN) 10 MG tablet, Take 10 mg by mouth at bedtime as needed. for sleep, Disp: , Rfl: 2 .  dexamethasone (DECADRON) 4 MG tablet, Take 4 mg by mouth daily., Disp: , Rfl: 0 .  etodolac (LODINE) 400 MG tablet, Take 400 mg by mouth 2 (two) times daily., Disp: , Rfl: 3 .  fluticasone (CUTIVATE) 0.05 % cream, , Disp: , Rfl:  Allergies  Allergen Reactions  . Sulfa Antibiotics Rash    Social History  Substance Use Topics  . Smoking status: Former Smoker    Packs/day: 1.50    Years: 25.00    Types: Cigarettes    Start date: 04/20/1973    Quit date: 01/27/1988  . Smokeless tobacco: Never Used     Comment:  quit smoking 15 years ago  . Alcohol use No    Family History  Problem Relation Age of Onset  . Lung cancer Father   . Breast cancer Maternal Aunt   . CAD Mother   . Breast cancer Daughter       Review of Systems  Constitutional: negative for fatigue and weight loss Respiratory: negative for cough and wheezing Cardiovascular: negative for chest pain, fatigue and palpitations Gastrointestinal: negative for abdominal pain and change in bowel habits Musculoskeletal:negative for myalgias Neurological: negative for gait problems and tremors Behavioral/Psych: negative for abusive relationship, depression Endocrine: negative for temperature intolerance   Genitourinary:negative for abnormal menstrual periods, genital lesions, hot flashes, sexual problems and vaginal discharge Integument/breast: negative for breast lump, breast tenderness, nipple discharge and skin lesion(s)    Objective:       BP 110/70   Pulse (!) 106   Temp 98.5 F (36.9 C)   Wt 142 lb (64.4 kg)   BMI 25.97 kg/m  General:   alert  Skin:   no rash or abnormalities  Lungs:   clear to auscultation bilaterally  Heart:   regular rate and rhythm, S1, S2 normal, no murmur, click, rub or gallop  Breasts:   normal without suspicious masses, skin or nipple changes or axillary nodes  Abdomen:  normal findings: no organomegaly, soft, non-tender and no hernia  Pelvis:  External genitalia: normal general appearance Urinary system: urethral meatus normal and bladder without fullness, nontender Vaginal: normal without tenderness, induration or masses Cervix: normal appearance Adnexa: normal bimanual exam Uterus: anteverted and non-tender, normal size   Lab Review Urine pregnancy test Labs reviewed yes Radiologic studies reviewed yes  50% of 20 min visit spent on counseling and coordination of care.   Assessment:    Healthy female exam.    Postmenopause.  Doing well.   Plan:    Education reviewed: calcium  supplements, depression evaluation, low fat, low cholesterol diet, self breast exams, skin cancer screening and weight bearing exercise. Follow up in: 1 year.   Meds ordered this encounter  Medications  . sitaGLIPtin-metformin (JANUMET) 50-1000 MG tablet    Sig: Take 1 tablet by mouth 2 (two) times daily with a meal.  . clindamycin (CLEOCIN) 300 MG capsule    Sig: Take 300 mg by mouth 3 (three) times daily.  . traZODone (DESYREL) 50 MG tablet    Sig:  Take 50 mg by mouth at bedtime.   No orders of the defined types were placed in this encounter.

## 2015-11-19 ENCOUNTER — Ambulatory Visit: Payer: BC Managed Care – PPO | Admitting: Family

## 2015-11-19 ENCOUNTER — Other Ambulatory Visit: Payer: BC Managed Care – PPO

## 2015-11-21 ENCOUNTER — Ambulatory Visit: Payer: BC Managed Care – PPO | Admitting: Family

## 2015-11-21 ENCOUNTER — Other Ambulatory Visit: Payer: BC Managed Care – PPO

## 2015-11-27 ENCOUNTER — Other Ambulatory Visit: Payer: Self-pay | Admitting: Family

## 2015-12-10 DIAGNOSIS — F32A Depression, unspecified: Secondary | ICD-10-CM | POA: Insufficient documentation

## 2015-12-10 DIAGNOSIS — M545 Low back pain, unspecified: Secondary | ICD-10-CM | POA: Insufficient documentation

## 2015-12-10 DIAGNOSIS — F329 Major depressive disorder, single episode, unspecified: Secondary | ICD-10-CM | POA: Insufficient documentation

## 2015-12-10 DIAGNOSIS — M19041 Primary osteoarthritis, right hand: Secondary | ICD-10-CM | POA: Insufficient documentation

## 2015-12-10 DIAGNOSIS — M19042 Primary osteoarthritis, left hand: Secondary | ICD-10-CM

## 2015-12-10 DIAGNOSIS — E119 Type 2 diabetes mellitus without complications: Secondary | ICD-10-CM | POA: Insufficient documentation

## 2015-12-10 DIAGNOSIS — R5383 Other fatigue: Secondary | ICD-10-CM | POA: Insufficient documentation

## 2015-12-10 NOTE — Progress Notes (Signed)
Office Visit Note  Patient: Linda [name]             Date of Birth: Sep 27, 1950           MRN: [id number]             PCP: Elyn Peers, MD Referring: Lucianne Lei, MD Visit Date: 12/13/2015 Occupation: unemployed    Subjective:  Pain hands   History of Present Illness: Linda [name] is a 65 y.o. female with history of fibromyalgia syndrome and osteoarthritis. She continues to have a lot of fatigue. She states that her insomnia is better. She's been having some discomfort in her right gluteal region which sometimes moves down into her leg. She rates her pain on a scale of 0-10 anywhere from 5-8 and fatigue about 6-7. She continues to have discomfort in her hands without any joint swelling. The lower back pain also persists. She has some chronic discomfort in her C-spine.   Activities of Daily Living:  Patient reports morning stiffness for 5 minutes.   Patient Reports nocturnal pain.  Difficulty dressing/grooming: Denies Difficulty climbing stairs: Reports Difficulty getting out of chair: Denies Difficulty using hands for taps, buttons, cutlery, and/or writing: Reports   Review of Systems  Constitutional: Positive for fatigue. Negative for night sweats, weight gain, weight loss and weakness.  HENT: Positive for mouth dryness. Negative for mouth sores, trouble swallowing, trouble swallowing and nose dryness.   Eyes: Positive for dryness. Negative for pain, redness and visual disturbance.  Respiratory: Negative for cough, shortness of breath and difficulty breathing.   Cardiovascular: Negative for chest pain, palpitations, hypertension, irregular heartbeat and swelling in legs/feet.  Gastrointestinal: Negative for blood in stool, constipation and diarrhea.  Endocrine: Negative for increased urination.  Genitourinary: Negative for vaginal dryness.  Musculoskeletal: Positive for arthralgias, joint pain, myalgias, morning stiffness and myalgias. Negative for joint swelling, muscle  weakness and muscle tenderness.  Skin: Negative for color change, rash, hair loss, skin tightness, ulcers and sensitivity to sunlight.  Allergic/Immunologic: Negative for susceptible to infections.  Neurological: Negative for dizziness, memory loss and night sweats.  Hematological: Negative for swollen glands.  Psychiatric/Behavioral: Positive for depressed mood. Negative for sleep disturbance. The patient is not nervous/anxious.     PMFS History:  Patient Active Problem List   Diagnosis Date Noted  . Fatigue 12/10/2015  . Primary osteoarthritis of both hands 12/10/2015  . Low back pain without sciatica 12/10/2015  . Type 2 diabetes mellitus  12/10/2015  . Depression 12/10/2015  . Genetic testing 01/11/2014  . Hives 11/11/2012  . Chest pain 04/23/2012  . Hyperlipidemia   . Chronic insomnia 01/29/2011  . Perennial allergic rhinitis 01/29/2011  . Fibromyalgia 01/29/2011    Past Medical History:  Diagnosis Date  . Allergic rhinitis   . Arthritis   . Breast cancer ([place]) 2002   left  . Diabetes ([place])   . Fibromyalgia   . Hyperlipidemia     Family History  Problem Relation Age of Onset  . Throat cancer Father   . Breast cancer Maternal Aunt   . CAD Mother   . Stroke Mother    Past Surgical History:  Procedure Laterality Date  . APPENDECTOMY  1989  . BREAST LUMPECTOMY  2002   left  . CHOLECYSTECTOMY  1989  . KNEE ARTHROSCOPY Right   . SHOULDER SURGERY Left   . VAGINAL HYSTERECTOMY  1987   Social History   Social History Narrative  . No narrative on file  Objective: Vital Signs: BP 121/64 (BP Location: Right Arm, Patient Position: Sitting, Cuff Size: Large)   Pulse 100   Resp 14   Ht 5' 2.5" (1.588 m)   Wt 147 lb (66.7 kg)   BMI 26.46 kg/m    Physical Exam  Constitutional: She is oriented to person, place, and time. She appears well-developed and well-nourished.  HENT:  Head: Normocephalic and atraumatic.  Eyes: Conjunctivae and EOM are normal.    Neck: Normal range of motion.  Cardiovascular: Normal rate, regular rhythm, normal heart sounds and intact distal pulses.   Pulmonary/Chest: Effort normal and breath sounds normal.  Abdominal: Soft. Bowel sounds are normal.  Lymphadenopathy:    She has no cervical adenopathy.  Neurological: She is alert and oriented to person, place, and time.  Skin: Skin is warm and dry. Capillary refill takes less than 2 seconds.  Psychiatric: She has a normal mood and affect. Her behavior is normal.  Nursing note and vitals reviewed.    Musculoskeletal Exam: C-spine good range of motion with some stiffness, thoracic spine good range of motion, lumbar spine she has limitation of range of motion with some pain and stiffness. Bilateral shoulder joints bilateral joints bilateral wrist joints with good range of motion she has thickening of PIP/DIP joints in her hands consistent with osteoarthritis no synovitis was noted. Bilateral hip joints knee joints ankle joints and MTPs PIPs were also good range of motion with no synovitis she has tenderness over bilateral trochanteric bursa area consistent with trochanteric bursitis she also has some discomfort on palpation of her right piriformis muscle.  CDAI Exam: No CDAI exam completed.    Investigation: No additional findings.   Imaging: No results found.  Speciality Comments: No specialty comments available.    Procedures:  No procedures performed Allergies: Sulfa antibiotics   Assessment / Plan: Visit Diagnoses: Fibromyalgia: She continues to have some generalized pain and discomfort and fatigue. she's been under a lot of stress due to her husband's health which is flaring her symptoms.  Chronic insomnia: It's been better since she's been on trazodone.  Fatigue: She continues to have some fatigue.  Primary osteoarthritis of both hands: It is causing chronic discomfort of advised her to use Voltaren gel and also given her a list of some natural  anti-inflammatories.  Low back pain: Exercises were discussed.  Her other medical problems are as follows for which she sees her PCP.  Type 2 diabetes mellitus   Depression    Orders: No orders of the defined types were placed in this encounter.  No orders of the defined types were placed in this encounter.   Face-to-face time spent with patient was 30 minutes. 50% of time was spent in counseling and coordination of care.  Follow-Up Instructions: Return in about 6 months (around 06/11/2016) for Fibromyalgia.   Bo Merino, MD

## 2015-12-12 ENCOUNTER — Other Ambulatory Visit: Payer: Self-pay | Admitting: *Deleted

## 2015-12-12 DIAGNOSIS — E559 Vitamin D deficiency, unspecified: Secondary | ICD-10-CM

## 2015-12-12 DIAGNOSIS — C50919 Malignant neoplasm of unspecified site of unspecified female breast: Secondary | ICD-10-CM

## 2015-12-13 ENCOUNTER — Ambulatory Visit (HOSPITAL_BASED_OUTPATIENT_CLINIC_OR_DEPARTMENT_OTHER): Payer: BC Managed Care – PPO | Admitting: Family

## 2015-12-13 ENCOUNTER — Ambulatory Visit (INDEPENDENT_AMBULATORY_CARE_PROVIDER_SITE_OTHER): Payer: BC Managed Care – PPO | Admitting: Rheumatology

## 2015-12-13 ENCOUNTER — Other Ambulatory Visit (HOSPITAL_BASED_OUTPATIENT_CLINIC_OR_DEPARTMENT_OTHER): Payer: BC Managed Care – PPO

## 2015-12-13 ENCOUNTER — Encounter: Payer: Self-pay | Admitting: Rheumatology

## 2015-12-13 VITALS — BP 128/60 | HR 96 | Temp 98.2°F | Resp 20 | Wt 145.8 lb

## 2015-12-13 VITALS — BP 121/64 | HR 100 | Resp 14 | Ht 62.5 in | Wt 147.0 lb

## 2015-12-13 DIAGNOSIS — Z853 Personal history of malignant neoplasm of breast: Secondary | ICD-10-CM

## 2015-12-13 DIAGNOSIS — F329 Major depressive disorder, single episode, unspecified: Secondary | ICD-10-CM | POA: Diagnosis not present

## 2015-12-13 DIAGNOSIS — M19041 Primary osteoarthritis, right hand: Secondary | ICD-10-CM

## 2015-12-13 DIAGNOSIS — M545 Low back pain, unspecified: Secondary | ICD-10-CM

## 2015-12-13 DIAGNOSIS — E119 Type 2 diabetes mellitus without complications: Secondary | ICD-10-CM

## 2015-12-13 DIAGNOSIS — F5104 Psychophysiologic insomnia: Secondary | ICD-10-CM

## 2015-12-13 DIAGNOSIS — M797 Fibromyalgia: Secondary | ICD-10-CM | POA: Diagnosis not present

## 2015-12-13 DIAGNOSIS — E559 Vitamin D deficiency, unspecified: Secondary | ICD-10-CM

## 2015-12-13 DIAGNOSIS — R5383 Other fatigue: Secondary | ICD-10-CM

## 2015-12-13 DIAGNOSIS — C50919 Malignant neoplasm of unspecified site of unspecified female breast: Secondary | ICD-10-CM

## 2015-12-13 DIAGNOSIS — M19042 Primary osteoarthritis, left hand: Secondary | ICD-10-CM

## 2015-12-13 DIAGNOSIS — F32A Depression, unspecified: Secondary | ICD-10-CM

## 2015-12-13 LAB — COMPREHENSIVE METABOLIC PANEL
ALT: 13 U/L (ref 0–55)
AST: 14 U/L (ref 5–34)
Albumin: 3.8 g/dL (ref 3.5–5.0)
Alkaline Phosphatase: 79 U/L (ref 40–150)
Anion Gap: 11 mEq/L (ref 3–11)
BUN: 17.2 mg/dL (ref 7.0–26.0)
CO2: 23 mEq/L (ref 22–29)
Calcium: 9.7 mg/dL (ref 8.4–10.4)
Chloride: 104 mEq/L (ref 98–109)
Creatinine: 0.8 mg/dL (ref 0.6–1.1)
EGFR: >90 mL/min/{1.73_m2} (ref >90)
Glucose: 91 mg/dl (ref 70–140)
Potassium: 4.1 mEq/L (ref 3.5–5.1)
Sodium: 138 mEq/L (ref 136–145)
Total Bilirubin: <0.22 mg/dL (ref 0.20–1.20)
Total Protein: 7.5 g/dL (ref 6.4–8.3)

## 2015-12-13 LAB — CBC WITH DIFFERENTIAL (CANCER CENTER ONLY)
BASO#: 0 10*3/uL (ref 0.0–0.2)
BASO%: 0.2 % (ref 0.0–2.0)
EOS%: 2.6 % (ref 0.0–7.0)
Eosinophils Absolute: 0.2 10*3/uL (ref 0.0–0.5)
HCT: 34.6 % — ABNORMAL LOW (ref 34.8–46.6)
HGB: 11.4 g/dL — ABNORMAL LOW (ref 11.6–15.9)
LYMPH#: 4.1 10*3/uL — ABNORMAL HIGH (ref 0.9–3.3)
LYMPH%: 45.7 % (ref 14.0–48.0)
MCH: 28.1 pg (ref 26.0–34.0)
MCHC: 32.9 g/dL (ref 32.0–36.0)
MCV: 85 fL (ref 81–101)
MONO#: 0.6 10*3/uL (ref 0.1–0.9)
MONO%: 6.1 % (ref 0.0–13.0)
NEUT#: 4.1 10*3/uL (ref 1.5–6.5)
NEUT%: 45.4 % (ref 39.6–80.0)
Platelets: 368 10*3/uL (ref 145–400)
RBC: 4.06 10*6/uL (ref 3.70–5.32)
RDW: 12.9 % (ref 11.1–15.7)
WBC: 9.1 10*3/uL (ref 3.9–10.0)

## 2015-12-13 LAB — LACTATE DEHYDROGENASE: LDH: 180 U/L (ref 125–245)

## 2015-12-13 NOTE — Patient Instructions (Signed)
Supplements for OA Natural anti-inflammatories  You can purchase these at [employer], [employer] or online.  . Turmeric (capsules)  . Ginger (ginger root or capsules)  . Omega 3 (Fish, flax seeds, chia seeds, walnuts, almonds)  . Tart cherry (dried or extract)   Patient should be under the care of a physician while taking these supplements. This may not be reproduced without the permission of Dr. Suheily Birks.  

## 2015-12-13 NOTE — Progress Notes (Signed)
Hematology and Oncology Follow Up Visit  Linda [name] [id number] 1950/12/02 65 y.o. 12/13/2015   Principle Diagnosis:  Stage IIA (T2 N0 M0) ductal carcinoma of the left breast triple-negative  Current Therapy:   Observation    Interim History:  Linda [name] is here today for her annual follow-up. She is doing fairly well. He husband has been ill and she has been taking care of him.  She has intermittent fatigue which she attributes to her fibromyalgia.  She continues to do self breast exams at home. Her breast exam today was negative. She had her mammogram in May and there was no evidence of malignancy.  She takes her Vit D supplement once a week and her count is within normal limits.  No lymphadenopathy found on ex No swelling, tenderness, numbness or tingling in her extremities.  No fever, chills, n/v, cough, rash, dizziness, SOB, chest pain, palpitations, abdominal pain or changes in bowel or bladder habits.  Her appetite has been up and down but she is staying hydrated. Her weight is stable.   Medications:    Medication List       Accurate as of 12/13/15  3:14 PM. Always use your most recent med list.          5-HTP 100 MG Caps Take 1 capsule by mouth at bedtime.   aspirin EC 81 MG tablet Take 1 tablet (81 mg total) by mouth daily.   baclofen 10 MG tablet Commonly known as:  LIORESAL Take 10 mg by mouth 2 (two) times daily.   buPROPion 300 MG 24 hr tablet Commonly known as:  WELLBUTRIN XL Take 300 mg by mouth daily.   [employer] ALLERGY RELIEF D 60-120 MG 12 hr tablet Generic drug:  fexofenadine-pseudoephedrine Take 1 tablet by mouth 2 (two) times daily.   fluticasone 0.05 % cream Commonly known as:  CUTIVATE   fluticasone 50 MCG/ACT nasal spray Commonly known as:  FLONASE USE 2 SPRAYS IN EACH NOSTRIL AT NIGHT   HYDROcodone-acetaminophen 5-325 MG tablet Commonly known as:  NORCO/VICODIN Take 1 tablet by mouth every 8 (eight) hours as needed. for pain     MELATONIN PO Take 1 capsule by mouth at bedtime.   sitaGLIPtin-metformin 50-1000 MG tablet Commonly known as:  JANUMET Take 1 tablet by mouth 2 (two) times daily with a meal.   traZODone 50 MG tablet Commonly known as:  DESYREL Take 50 mg by mouth at bedtime.   Vitamin D (Ergocalciferol) 50000 units Caps capsule Commonly known as:  DRISDOL TAKE 1 CAPSULE (50,000 UNITS TOTAL) BY MOUTH ONCE A WEEK.       Allergies:  Allergies  Allergen Reactions  . Sulfa Antibiotics Rash    Past Medical History, Surgical history, Social history, and Family History were reviewed and updated.  Review of Systems: All other 10 point review of systems is negative.   Physical Exam:  weight is 145 lb 12.8 oz (66.1 kg). Her oral temperature is 98.2 F (36.8 C). Her blood pressure is 128/60 and her pulse is 96. Her respiration is 20.   Wt Readings from Last 3 Encounters:  12/13/15 145 lb 12.8 oz (66.1 kg)  12/13/15 147 lb (66.7 kg)  10/03/15 142 lb (64.4 kg)    Ocular: Sclerae unicteric, pupils equal, round and reactive to light Ear-nose-throat: Oropharynx clear, dentition fair Lymphatic: No cervical, supraclavicular or axillary adenopathy Lungs no rales or rhonchi, good excursion bilaterally Heart regular rate and rhythm, no murmur appreciated Abd soft, nontender, positive bowel sounds,  no organomegally MSK no focal spinal tenderness, no joint edema Neuro: non-focal, well-oriented, appropriate affect Breasts: Left breast lumpectomy surgical site, no changes to the right breast. No mass, lesion, rash or lymphadenopathy found on exam.   Lab Results  Component Value Date   WBC 9.1 12/13/2015   HGB 11.4 (L) 12/13/2015   HCT 34.6 (L) 12/13/2015   MCV 85 12/13/2015   PLT 368 12/13/2015   Lab Results  Component Value Date   FERRITIN 25 04/23/2012   IRON 64 04/23/2012   TIBC 372 04/23/2012   UIBC 308 04/23/2012   IRONPCTSAT 17 (L) 04/23/2012   Lab Results  Component Value Date    RETICCTPCT 1.3 04/23/2012   RBC 4.06 12/13/2015   No results found for: KPAFRELGTCHN, LAMBDASER, KAPLAMBRATIO No results found for: IGGSERUM, IGA, IGMSERUM No results found for: Odetta Pink, SPEI   Chemistry      Component Value Date/Time   NA 143 11/19/2014 0921   K 4.3 11/19/2014 0921   CL 102 11/20/2013 1446   CO2 28 11/19/2014 0921   BUN 9.4 11/19/2014 0921   CREATININE 0.8 11/19/2014 0921      Component Value Date/Time   CALCIUM 9.8 11/19/2014 0921   ALKPHOS 79 11/19/2014 0921   AST 19 11/19/2014 0921   ALT 33 11/19/2014 0921   BILITOT <0.30 11/19/2014 0921     Impression and Plan: Linda [name] is a 65 yo African American female with history of stage IIA (T2 N0 M0) ductal carcinoma of the left breast triple-negative. She was diagnosed and treated over 14 years ago. She had a lumpectomy followed by chemotherapy with Adriamycin/Cytoxan. So far, there has been no evidence of recurrence.  She continues to do well and has no complaints at this time. She continues to do self breast exams at home and will be due again for her mammogram in May 2018.  We will plan to see her back in 1 years for labs and follow-up.  She will contact our office with any questions or concerns. We can certainly see her sooner if need be.   Eliezer Bottom, NP 11/17/20173:14 PM

## 2015-12-14 LAB — VITAMIN D 25 HYDROXY (VIT D DEFICIENCY, FRACTURES): Vitamin D, 25-Hydroxy: 35.6 ng/mL (ref 30.0–100.0)

## 2016-04-21 ENCOUNTER — Other Ambulatory Visit: Payer: Self-pay | Admitting: Gastroenterology

## 2016-04-21 DIAGNOSIS — R1032 Left lower quadrant pain: Secondary | ICD-10-CM

## 2016-04-24 ENCOUNTER — Ambulatory Visit
Admission: RE | Admit: 2016-04-24 | Discharge: 2016-04-24 | Disposition: A | Discharge status: Home / Self Care | Payer: BC Managed Care – PPO | Source: Ambulatory Visit | Attending: Gastroenterology | Admitting: Gastroenterology

## 2016-04-24 DIAGNOSIS — R1032 Left lower quadrant pain: Secondary | ICD-10-CM

## 2016-04-24 MED ORDER — IOPAMIDOL (ISOVUE-300) INJECTION 61%
100.0000 mL | Freq: Once | INTRAVENOUS | Status: AC | PRN
  Administered 2016-04-24: 100 mL via INTRAVENOUS

## 2016-04-25 ENCOUNTER — Other Ambulatory Visit: Payer: Self-pay | Admitting: Rheumatology

## 2016-04-28 NOTE — Telephone Encounter (Signed)
ok 

## 2016-04-28 NOTE — Telephone Encounter (Signed)
Last Visit: 12/13/15 Next Visit: 06/11/16  Okay to refill Trazadone?

## 2016-05-07 ENCOUNTER — Other Ambulatory Visit: Payer: Self-pay | Admitting: Rheumatology

## 2016-05-07 DIAGNOSIS — C50919 Malignant neoplasm of unspecified site of unspecified female breast: Secondary | ICD-10-CM

## 2016-05-07 DIAGNOSIS — M810 Age-related osteoporosis without current pathological fracture: Secondary | ICD-10-CM

## 2016-05-07 NOTE — Telephone Encounter (Signed)
Last Visit: 12/13/15 Next Visit: 06/11/16  Okay to refill Baclofen?

## 2016-05-07 NOTE — Telephone Encounter (Signed)
ok 

## 2016-05-25 ENCOUNTER — Other Ambulatory Visit: Payer: Self-pay | Admitting: Hematology & Oncology

## 2016-05-25 DIAGNOSIS — Z1231 Encounter for screening mammogram for malignant neoplasm of breast: Secondary | ICD-10-CM

## 2016-06-11 ENCOUNTER — Ambulatory Visit: Payer: BC Managed Care – PPO | Admitting: Rheumatology

## 2016-06-18 ENCOUNTER — Ambulatory Visit: Payer: BC Managed Care – PPO

## 2016-07-14 ENCOUNTER — Ambulatory Visit
Admission: RE | Admit: 2016-07-14 | Discharge: 2016-07-14 | Disposition: A | Discharge status: Home / Self Care | Payer: Medicare Other | Source: Ambulatory Visit | Attending: Hematology & Oncology | Admitting: Hematology & Oncology

## 2016-07-14 DIAGNOSIS — Z1231 Encounter for screening mammogram for malignant neoplasm of breast: Secondary | ICD-10-CM

## 2016-07-14 HISTORY — DX: Personal history of irradiation: Z92.3

## 2016-07-14 HISTORY — DX: Personal history of antineoplastic chemotherapy: Z92.21

## 2016-10-21 ENCOUNTER — Other Ambulatory Visit: Payer: Self-pay | Admitting: Rheumatology

## 2016-10-21 NOTE — Telephone Encounter (Signed)
Last Visit: 12/13/15 Next Visit was due May 2018. Message sent to front to schedule patient. ( We treat patient for Fibromyalgia)   Okay to refill Trazodone?

## 2016-10-21 NOTE — Telephone Encounter (Signed)
ok 

## 2016-10-27 ENCOUNTER — Ambulatory Visit (INDEPENDENT_AMBULATORY_CARE_PROVIDER_SITE_OTHER): Payer: Medicare Other | Admitting: Rheumatology

## 2016-10-27 ENCOUNTER — Encounter: Payer: Self-pay | Admitting: Rheumatology

## 2016-10-27 VITALS — BP 124/70 | HR 82 | Resp 16 | Ht 63.0 in | Wt 146.0 lb

## 2016-10-27 DIAGNOSIS — M7061 Trochanteric bursitis, right hip: Secondary | ICD-10-CM

## 2016-10-27 DIAGNOSIS — F5104 Psychophysiologic insomnia: Secondary | ICD-10-CM

## 2016-10-27 DIAGNOSIS — M797 Fibromyalgia: Secondary | ICD-10-CM

## 2016-10-27 DIAGNOSIS — R5383 Other fatigue: Secondary | ICD-10-CM | POA: Diagnosis not present

## 2016-10-27 DIAGNOSIS — M62838 Other muscle spasm: Secondary | ICD-10-CM

## 2016-10-27 DIAGNOSIS — M7062 Trochanteric bursitis, left hip: Secondary | ICD-10-CM

## 2016-10-27 MED ORDER — DICLOFENAC SODIUM 1 % TD GEL: TRANSDERMAL | 3 refills | Status: DC

## 2016-10-27 MED ORDER — LIDOCAINE HCL 1 % IJ SOLN
0.5000 mL | INTRAMUSCULAR | Status: AC | PRN
  Administered 2016-10-27: .5 mL

## 2016-10-27 NOTE — Progress Notes (Signed)
Office Visit Note  Patient: Linda [name]             Date of Birth: 01-Jun-1950           MRN: [id number]             PCP: Lucianne Lei, MD Referring: Lucianne Lei, MD Visit Date: 10/27/2016 Occupation: _0 @    Subjective:  No chief complaint on file.   History of Present Illness: Linda [name] is a 66 y.o. female  Was last seen in our office 12/13/2015 for fibromyalgia syndrome and osteoarthritis.  She's been doing well with these. At the last visit, she rated her fibromyalgia discomfort between 5-8 on a scale of 0-10. She rated her fatigue as 6-7 on a scale of 0-10. Today,patient states that her fibromyalgia is rated about 5-6 on a scale of 0-10; her fatigue is rated 5 on a scale of 0-10. She is somewhat better this visit than she was on the last visit in November. She is taking her medication as prescribed and they do help.  She has back pain for which she is getting a cortisone shot. She received one about a month ago. Has helped her patient's pain was rated 8-9 on a scale of 0-10 prior to her cortisone injection in her back; now it's about a 5 or 6 on a scale of 0-10. She is happy with the results so far.   Patient is havingbilateral greater trochanteric bursa pain; bilateral SI joint pain. Especially tender or her trapezius muscles. We discussed the option of doing cortisone injection or lidocaine injection or combination. Patient states that she only wants lidocaine injection for now.  Activities of Daily Living:  Patient reports morning stiffness for 5-10 minutes.   Patient Reports nocturnal pain.  Difficulty dressing/grooming: Denies Difficulty climbing stairs: Denies Difficulty getting out of chair: Denies Difficulty using hands for taps, buttons, cutlery, and/or writing: Denies   Review of Systems  Constitutional: Positive for fatigue.  HENT: Negative for mouth sores and mouth dryness.   Eyes: Negative for dryness.  Respiratory: Negative for shortness of  breath.   Gastrointestinal: Negative for constipation and diarrhea.  Musculoskeletal: Positive for myalgias and myalgias.  Skin: Negative for sensitivity to sunlight.  Psychiatric/Behavioral: Positive for sleep disturbance. Negative for decreased concentration.    PMFS History:  Patient Active Problem List   Diagnosis Date Noted  . Fatigue 12/10/2015  . Primary osteoarthritis of both hands 12/10/2015  . Low back pain without sciatica 12/10/2015  . Type 2 diabetes mellitus  12/10/2015  . Depression 12/10/2015  . Genetic testing 01/11/2014  . Hives 11/11/2012  . Chest pain 04/23/2012  . Hyperlipidemia   . Chronic insomnia 01/29/2011  . Perennial allergic rhinitis 01/29/2011  . Fibromyalgia 01/29/2011    Past Medical History:  Diagnosis Date  . Allergic rhinitis   . Arthritis   . Breast cancer ([place]) 2002   left  . Diabetes ([place])   . Fibromyalgia   . Hyperlipidemia   . Personal history of chemotherapy 05/2000   left breast  . Personal history of radiation therapy 2002   Letf breast    Family History  Problem Relation Age of Onset  . Throat cancer Father   . Breast cancer Maternal Aunt   . CAD Mother   . Stroke Mother   . Breast cancer Daughter    Past Surgical History:  Procedure Laterality Date  . APPENDECTOMY  1989  . BREAST BIOPSY Left 04/26/2000  . BREAST  LUMPECTOMY Left 05/14/2000   left  . CHOLECYSTECTOMY  1989  . KNEE ARTHROSCOPY Right   . SHOULDER SURGERY Left   . VAGINAL HYSTERECTOMY  1987   Social History   Social History Narrative  . No narrative on file     Objective: Vital Signs: BP 124/70   Pulse 82   Resp 16   Ht _0  (1.6 m)   Wt 146 lb (66.2 kg)   BMI 25.86 kg/m    Physical Exam  Constitutional: She is oriented to person, place, and time. She appears well-developed and well-nourished.  HENT:  Head: Normocephalic and atraumatic.  Eyes: Pupils are equal, round, and reactive to light. EOM are normal.  Cardiovascular: Normal rate,  regular rhythm and normal heart sounds.  Exam reveals no gallop and no friction rub.   No murmur heard. Pulmonary/Chest: Effort normal and breath sounds normal. She has no wheezes. She has no rales.  Abdominal: Soft. Bowel sounds are normal. She exhibits no distension. There is no tenderness. There is no guarding. No hernia.  Musculoskeletal: Normal range of motion. She exhibits no edema, tenderness or deformity.  Lymphadenopathy:    She has no cervical adenopathy.  Neurological: She is alert and oriented to person, place, and time. Coordination normal.  Skin: Skin is warm and dry. Capillary refill takes less than 2 seconds. No rash noted.  Psychiatric: She has a normal mood and affect. Her behavior is normal.  Nursing note and vitals reviewed.    Musculoskeletal Exam:  Full range of motion of all joints Grip strength is equal and strong bilaterally Fibromyalgia tender points are 18 out of 18 positive  CDAI Exam: No CDAI exam completed.  No synovitis on exam  Investigation: No additional findings. No visits with results within 6 Month(s) from this visit.  Latest known visit with results is:  Appointment on 12/13/2015  Component Date Value Ref Range Status  . WBC 12/13/2015 9.1  3.9 - 10.0 10e3/uL Final  . RBC 12/13/2015 4.06  3.70 - 5.32 10e6/uL Final  . HGB 12/13/2015 11.4* 11.6 - 15.9 g/dL Final  . HCT 12/13/2015 34.6* 34.8 - 46.6 % Final  . MCV 12/13/2015 85  81 - 101 fL Final  . MCH 12/13/2015 28.1  26.0 - 34.0 pg Final  . MCHC 12/13/2015 32.9  32.0 - 36.0 g/dL Final  . RDW 12/13/2015 12.9  11.1 - 15.7 % Final  . Platelets 12/13/2015 368  145 - 400 10e3/uL Final  . NEUT# 12/13/2015 4.1  1.5 - 6.5 10e3/uL Final  . LYMPH# 12/13/2015 4.1* 0.9 - 3.3 10e3/uL Final  . MONO# 12/13/2015 0.6  0.1 - 0.9 10e3/uL Final  . Eosinophils Absolute 12/13/2015 0.2  0.0 - 0.5 10e3/uL Final  . BASO# 12/13/2015 0.0  0.0 - 0.2 10e3/uL Final  . NEUT% 12/13/2015 45.4  39.6 - 80.0 % Final  .  LYMPH% 12/13/2015 45.7  14.0 - 48.0 % Final  . MONO% 12/13/2015 6.1  0.0 - 13.0 % Final  . EOS% 12/13/2015 2.6  0.0 - 7.0 % Final  . BASO% 12/13/2015 0.2  0.0 - 2.0 % Final  . Sodium 12/13/2015 138  136 - 145 mEq/L Final  . Potassium 12/13/2015 4.1  3.5 - 5.1 mEq/L Final  . Chloride 12/13/2015 104  98 - 109 mEq/L Final  . CO2 12/13/2015 23  22 - 29 mEq/L Final  . Glucose 12/13/2015 91  70 - 140 mg/dl Final   Glucose reference range is for nonfasting patients.  Fasting glucose reference range is 70- 100.  Marland Kitchen BUN 12/13/2015 17.2  7.0 - 26.0 mg/dL Final  . Creatinine 12/13/2015 0.8  0.6 - 1.1 mg/dL Final  . Total Bilirubin 12/13/2015 <0.22  0.20 - 1.20 mg/dL Final  . Alkaline Phosphatase 12/13/2015 79  40 - 150 U/L Final  . AST 12/13/2015 14  5 - 34 U/L Final  . ALT 12/13/2015 13  0 - 55 U/L Final  . Total Protein 12/13/2015 7.5  6.4 - 8.3 g/dL Final  . Albumin 12/13/2015 3.8  3.5 - 5.0 g/dL Final  . Calcium 12/13/2015 9.7  8.4 - 10.4 mg/dL Final  . Anion Gap 12/13/2015 11  3 - 11 mEq/L Final  . EGFR 12/13/2015 >90  >90 ml/min/1.73 m2 Final   eGFR is calculated using the CKD-EPI Creatinine Equation (2009)  . LDH 12/13/2015 180  125 - 245 U/L Final  . Vitamin D, 25-Hydroxy 12/13/2015 35.6  30.0 - 100.0 ng/mL Final   Comment: Vitamin D deficiency has been defined by the [place] practice guideline as a level of serum 25-OH vitamin D less than 20 ng/mL (1,2). The Endocrine Society went on to further define vitamin D insufficiency as a level between 21 and 29 ng/mL (2). 1. IOM (Institute of Medicine). 2010. Dietary reference    intakes for calcium and D. [place]: The    [employer]. 2. Holick MF, [place], Bischoff-Ferrari HA, et al.    Evaluation, treatment, and prevention of vitamin D    deficiency: an Endocrine Society clinical practice    guideline. JCEM. 2011 Jul; 96(7):1911-30.      Imaging: No results  found.  Speciality Comments: No specialty comments available.    Procedures:  Trigger Point Inj Date/Time: 10/27/2016 3:39 PM Performed by: Eliezer Lofts Authorized by: Eliezer Lofts   Consent Given by:  Patient Site marked: the procedure site was marked   Timeout: prior to procedure the correct patient, procedure, and site was verified   Indications:  Muscle spasm and pain Total # of Trigger Points:  2 Location: neck   Needle Size:  27 G Approach:  Dorsal Medications #1:  0.5 mL lidocaine 1 % Medications #2:  0.5 mL lidocaine 1 % Patient tolerance:  Patient tolerated the procedure well with no immediate complications Comments: Patient receivedlidocaine only injection today to bilateral trapezius muscles. Patient tolerated procedure well. There are no complication    Allergies: Sulfa antibiotics   Assessment / Plan:     Visit Diagnoses: Fibromyalgia  Fatigue  Chronic insomnia  Trapezius muscle spasm   Plan: #1: fibromyalgia syndrome. Patient is doing better on this visit versus the last visit.  #2: Fatigue and insomnia. Ongoing.  #3: Bilateral trapezius muscle spasm. Please see procedure note for details. Patient recently got a cortisone injection to her lower back and is worried that getting cortisone plus lidocaine in the trapezius muscles may be too much for her body. Therefore, we did lidocaine only injection. After the procedure, patient stated that her pain was already getting better. She rated her discomfort as 7 on a scale of 0-10 and she is already a 5 or 6 couple of minutes after the injection. I'm hopeful that patient gets full relief with this medication. If she needs to get repeat lidocaine injection, she can do that again in 3 months.  4: Return to clinic in 6 months  #5: Bilateral greater trochanteric bursitis. IT band exercise demonstrated to the patient.  Orders: Orders Placed This Encounter  Procedures  . Trigger Point Injection    Meds ordered this encounter  Medications  . diclofenac sodium (VOLTAREN) 1 % GEL    Sig: Voltaren Gel 3 grams to 3 large joints upto TID 5 TUBES with 3 refills    Dispense:  5 Tube    Refill:  3    Voltaren Gel 3 grams to 3 large joints upto TID 5 TUBES with 3 refills    Order Specific Question:   Supervising Provider    Answer:   Bo Merino [phone]    Face-to-face time spent with patient was 30 minutes. 50% of time was spent in counseling and coordination of care.  Follow-Up Instructions: Return in about 6 months (around 04/27/2017) for FMS, FATIGUE,INSOMNIA, bil trap musc spasm.   Eliezer Lofts, PA-C  Note - This record has been created using [employer].  Chart creation errors have been sought, but may not always  have been located. Such creation errors do not reflect on  the standard of medical care.

## 2016-10-28 ENCOUNTER — Other Ambulatory Visit: Payer: Self-pay | Admitting: Rheumatology

## 2016-10-28 ENCOUNTER — Telehealth: Payer: Self-pay

## 2016-10-28 DIAGNOSIS — C50919 Malignant neoplasm of unspecified site of unspecified female breast: Secondary | ICD-10-CM

## 2016-10-28 DIAGNOSIS — M810 Age-related osteoporosis without current pathological fracture: Secondary | ICD-10-CM

## 2016-10-28 NOTE — Telephone Encounter (Signed)
Last Visit: 10/27/16 Next Visit: 04/29/17  Okay to refill per Dr. Estanislado Pandy

## 2016-10-28 NOTE — Telephone Encounter (Signed)
A prior authorization for Voltaren Gel has been submitted to patient's insurance via cover my meds. Will update once we receive a response.   Linda [name], [place], CPhT 2:54 PM

## 2016-10-28 NOTE — Telephone Encounter (Signed)
Received a confirmation from CoverMyMeds regarding a prior authorization approval for Diclofenac Gel from 10/28/2016 to 01/25/2018.   Reference number: [id number] Phone number: [phone]  Will send document to scan center once received.  Called patient to update. Patient voiced understanding and denies any questions at this time.   Christohper Dube, [place], CPhT 3:45 PM

## 2016-11-05 ENCOUNTER — Ambulatory Visit: Payer: Medicare Other | Admitting: Obstetrics

## 2016-11-24 ENCOUNTER — Encounter: Payer: Self-pay | Admitting: Obstetrics

## 2016-11-24 ENCOUNTER — Ambulatory Visit (INDEPENDENT_AMBULATORY_CARE_PROVIDER_SITE_OTHER): Payer: Medicare Other | Admitting: Obstetrics

## 2016-11-24 ENCOUNTER — Other Ambulatory Visit (HOSPITAL_COMMUNITY)
Admission: RE | Admit: 2016-11-24 | Discharge: 2016-11-24 | Disposition: A | Discharge status: Home / Self Care | Payer: Medicare Other | Source: Ambulatory Visit | Attending: Obstetrics | Admitting: Obstetrics

## 2016-11-24 VITALS — BP 125/73 | HR 99 | Ht 63.0 in | Wt 132.8 lb

## 2016-11-24 DIAGNOSIS — Z01419 Encounter for gynecological examination (general) (routine) without abnormal findings: Secondary | ICD-10-CM | POA: Insufficient documentation

## 2016-11-24 DIAGNOSIS — N952 Postmenopausal atrophic vaginitis: Secondary | ICD-10-CM

## 2016-11-24 DIAGNOSIS — N9411 Superficial (introital) dyspareunia: Secondary | ICD-10-CM

## 2016-11-24 NOTE — Progress Notes (Signed)
Patient is in the office for annual exam, hx of vaginal hysterectomy related to endometriosis.

## 2016-11-24 NOTE — Progress Notes (Signed)
Subjective:        Linda [name] is a 66 y.o. female here for a routine exam.  Current complaints: Vaginal pain with ointercourse..    Personal health questionnaire:  Is patient Ashkenazi Jewish, have a family history of breast and/or ovarian cancer: no Is there a family history of uterine cancer diagnosed at age < 47, gastrointestinal cancer, urinary tract cancer, family member who is a Field seismologist syndrome-associated carrier: no Is the patient overweight and hypertensive, family history of diabetes, personal history of gestational diabetes, preeclampsia or PCOS: no Is patient over 72, have PCOS,  family history of premature CHD under age 58, diabetes, smoke, have hypertension or peripheral artery disease:  no At any time, has a partner hit, kicked or otherwise hurt or frightened you?: no Over the past 2 weeks, have you felt down, depressed or hopeless?: no Over the past 2 weeks, have you felt little interest or pleasure in doing things?:no   Gynecologic History No LMP recorded. Patient has had a hysterectomy. Contraception: status post hysterectomy Last Pap: unknown. Results were: normal Last mammogram: 2018. Results were: normal  Obstetric History OB History  Gravida Para Term Preterm AB Living  3 3 3     3   SAB TAB Ectopic Multiple Live Births          3    # Outcome Date GA Lbr Len/2nd Weight Sex Delivery Anes PTL Lv  3 Term 04/16/82    F    LIV  2 Term 04/17/79    F    LIV  1 Term 05/28/70    F    LIV      Past Medical History:  Diagnosis Date  . Allergic rhinitis   . Arthritis   . Breast cancer ([place]) 2002   left  . Diabetes ([place])   . Fibromyalgia   . Hyperlipidemia   . Personal history of chemotherapy 05/2000   left breast  . Personal history of radiation therapy 2002   Letf breast    Past Surgical History:  Procedure Laterality Date  . APPENDECTOMY  1989  . BREAST BIOPSY Left 04/26/2000  . BREAST LUMPECTOMY Left 05/14/2000   left  . CHOLECYSTECTOMY   1989  . KNEE ARTHROSCOPY Right   . SHOULDER SURGERY Left   . VAGINAL HYSTERECTOMY  1987     Current Outpatient Prescriptions:  .  aspirin EC 81 MG tablet, Take 1 tablet (81 mg total) by mouth daily., Disp: , Rfl:  .  baclofen (LIORESAL) 10 MG tablet, TAKE 1 TABLET BY MOUTH DAILY AT 8AM AND 3PM, Disp: 60 tablet, Rfl: 5 .  buPROPion (WELLBUTRIN XL) 300 MG 24 hr tablet, Take 300 mg by mouth daily.  , Disp: , Rfl:  .  diclofenac sodium (VOLTAREN) 1 % GEL, Voltaren Gel 3 grams to 3 large joints upto TID 5 TUBES with 3 refills, Disp: 5 Tube, Rfl: 3 .  Nutritional Supplements (MELATONIN PO), Take 1 capsule by mouth at bedtime.  , Disp: , Rfl:  .  sitaGLIPtin-metformin (JANUMET) 50-1000 MG tablet, Take 1 tablet by mouth 2 (two) times daily with a meal., Disp: , Rfl:  .  traZODone (DESYREL) 50 MG tablet, TAKE 1 TABLET BY MOUTH AT BEDTIME, Disp: 30 tablet, Rfl: 5 .  Vitamin D, Ergocalciferol, (DRISDOL) 50000 units CAPS capsule, TAKE 1 CAPSULE (50,000 UNITS TOTAL) BY MOUTH ONCE A WEEK., Disp: 30 capsule, Rfl: 2 .  5-Hydroxytryptophan (5-HTP) 100 MG CAPS, Take 1 capsule by mouth at  bedtime.  , Disp: , Rfl:  .  [employer] ALLERGY RELIEF D 60-120 MG 12 hr tablet, Take 1 tablet by mouth 2 (two) times daily., Disp: , Rfl: 2 .  fluticasone (CUTIVATE) 0.05 % cream, , Disp: , Rfl:  .  fluticasone (FLONASE) 50 MCG/ACT nasal spray, USE 2 SPRAYS IN EACH NOSTRIL AT NIGHT, Disp: , Rfl: 5 .  HYDROcodone-acetaminophen (NORCO/VICODIN) 5-325 MG tablet, Take 1 tablet by mouth every 8 (eight) hours as needed. for pain, Disp: , Rfl: 0 Allergies  Allergen Reactions  . Sulfa Antibiotics Rash    Social History  Substance Use Topics  . Smoking status: Former Smoker    Packs/day: 1.50    Years: 25.00    Types: Cigarettes    Start date: 04/20/1973    Quit date: 01/27/1988  . Smokeless tobacco: Never Used     Comment: quit smoking 15 years ago  . Alcohol use 0.0 oz/week     Comment: 3 monthly    Family History  Problem  Relation Age of Onset  . Throat cancer Father   . Breast cancer Maternal Aunt   . CAD Mother   . Stroke Mother   . Breast cancer Daughter       Review of Systems  Constitutional: negative for fatigue and weight loss Respiratory: negative for cough and wheezing Cardiovascular: negative for chest pain, fatigue and palpitations Gastrointestinal: negative for abdominal pain and change in bowel habits Musculoskeletal:negative for myalgias Neurological: negative for gait problems and tremors Behavioral/Psych: negative for abusive relationship, depression Endocrine: negative for temperature intolerance    Genitourinary:positive for painful intercourse Integument/breast: negative for breast lump, breast tenderness, nipple discharge and skin lesion(s)    Objective:       BP 125/73   Pulse 99   Ht 5\' 3"  (1.6 m)   Wt 132 lb 12.8 oz (60.2 kg)   BMI 23.52 kg/m  General:   alert  Skin:   no rash or abnormalities  Lungs:   clear to auscultation bilaterally  Heart:   regular rate and rhythm, S1, S2 normal, no murmur, click, rub or gallop  Breasts:   normal without suspicious masses, skin or nipple changes or axillary nodes  Abdomen:  normal findings: no organomegaly, soft, non-tender and no hernia  Pelvis:  External genitalia: normal general appearance Urinary system: urethral meatus normal and bladder without fullness, nontender Vaginal: normal without tenderness, induration or masses Cervix: normal appearance Adnexa: normal bimanual exam Uterus: anteverted and non-tender, normal size   Lab Review Urine pregnancy test Labs reviewed yes Radiologic studies reviewed yes  50% of 20 min visit spent on counseling and coordination of care.    Assessment:    1. Encounter for gynecological examination without abnormal finding - cervicovaginal ancillary only  2. Post-menopausal atrophic vaginitis - E2 not an option because of Breast CA history  3. Introital dyspareunia - vaginal  moisturizers and lubricants recommended - increased foreplay recommended    Plan:    Education reviewed: calcium supplements, depression evaluation, low fat, low cholesterol diet, self breast exams, skin cancer screening and weight bearing exercise. Follow up in: 1 year.   No orders of the defined types were placed in this encounter.  No orders of the defined types were placed in this encounter.

## 2016-11-26 LAB — CERVICOVAGINAL ANCILLARY ONLY
Bacterial vaginitis: NEGATIVE
Candida vaginitis: NEGATIVE

## 2016-12-11 ENCOUNTER — Other Ambulatory Visit (HOSPITAL_BASED_OUTPATIENT_CLINIC_OR_DEPARTMENT_OTHER): Payer: Medicare Other

## 2016-12-11 ENCOUNTER — Other Ambulatory Visit: Payer: Self-pay

## 2016-12-11 ENCOUNTER — Encounter: Payer: Self-pay | Admitting: Family

## 2016-12-11 ENCOUNTER — Ambulatory Visit: Payer: Medicare Other | Admitting: Family

## 2016-12-11 VITALS — BP 151/59 | HR 69 | Temp 98.4°F | Resp 16 | Ht 63.0 in | Wt 134.0 lb

## 2016-12-11 DIAGNOSIS — Z853 Personal history of malignant neoplasm of breast: Secondary | ICD-10-CM

## 2016-12-11 DIAGNOSIS — E559 Vitamin D deficiency, unspecified: Secondary | ICD-10-CM

## 2016-12-11 LAB — COMPREHENSIVE METABOLIC PANEL (CC13)
ALT: 13 IU/L (ref 0–32)
AST (SGOT): 19 IU/L (ref 0–40)
Albumin, Serum: 4.5 g/dL (ref 3.6–4.8)
Albumin/Globulin Ratio: 1.6 (ref 1.2–2.2)
Alkaline Phosphatase, S: 80 IU/L (ref 39–117)
BUN/Creatinine Ratio: 19 (ref 12–28)
BUN: 14 mg/dL (ref 8–27)
Bilirubin Total: 0.2 mg/dL (ref 0.0–1.2)
Calcium, Ser: 9.6 mg/dL (ref 8.7–10.3)
Carbon Dioxide, Total: 28 mmol/L (ref 20–29)
Chloride, Ser: 104 mmol/L (ref 96–106)
Creatinine, Ser: 0.72 mg/dL (ref 0.57–1.00)
GFR calc Af Amer: 102 mL/min/{1.73_m2} (ref >59)
GFR calc non Af Amer: 88 mL/min/{1.73_m2} (ref >59)
Globulin, Total: 2.8 g/dL (ref 1.5–4.5)
Glucose: 132 mg/dL — ABNORMAL HIGH (ref 65–99)
Potassium, Ser: 3.9 mmol/L (ref 3.5–5.2)
Sodium: 139 mmol/L (ref 134–144)
Total Protein: 7.3 g/dL (ref 6.0–8.5)

## 2016-12-11 LAB — CBC WITH DIFFERENTIAL (CANCER CENTER ONLY)
BASO#: 0 10*3/uL (ref 0.0–0.2)
BASO%: 0.4 % (ref 0.0–2.0)
EOS%: 1.4 % (ref 0.0–7.0)
Eosinophils Absolute: 0.1 10*3/uL (ref 0.0–0.5)
HCT: 35.2 % (ref 34.8–46.6)
HGB: 11.5 g/dL — ABNORMAL LOW (ref 11.6–15.9)
LYMPH#: 2.9 10*3/uL (ref 0.9–3.3)
LYMPH%: 36 % (ref 14.0–48.0)
MCH: 27.9 pg (ref 26.0–34.0)
MCHC: 32.7 g/dL (ref 32.0–36.0)
MCV: 85 fL (ref 81–101)
MONO#: 0.4 10*3/uL (ref 0.1–0.9)
MONO%: 4.6 % (ref 0.0–13.0)
NEUT#: 4.6 10*3/uL (ref 1.5–6.5)
NEUT%: 57.6 % (ref 39.6–80.0)
Platelets: 333 10*3/uL (ref 145–400)
RBC: 4.12 10*6/uL (ref 3.70–5.32)
RDW: 13.8 % (ref 11.1–15.7)
WBC: 8 10*3/uL (ref 3.9–10.0)

## 2016-12-11 LAB — LACTATE DEHYDROGENASE: LDH: 211 U/L (ref 125–245)

## 2016-12-11 NOTE — Progress Notes (Signed)
Hematology and Oncology Follow Up Visit  Linda [name] [id number] 1950/09/27 66 y.o. 12/11/2016   Principle Diagnosis:  Stage IIA (T2 N0 M0) ductal carcinoma of the left breast triple-negative  Current Therapy:   Observation   Interim History:  Linda [name] is here today for her annual follow-up. She is doing well. Breast exam today was negative. No mass, lesion or rash noted. Left breast lumpectomy site intact. He mammogram in June was negative.  She does self breast exams at home regularly.  She would like a prescription for a breast prosthesis and bra.  She has had some sinus congestion with headache and dizziness.  No fever, chills, cough, rash, SOB, chest pain, palpitations, abdominal pain or changes in bowel or bladder habits.  She has occasional nausea with GERD, no vomiting.  No bleeding, bruising or petechiae. No lymphadenopathy found on exam.  She has chronic constipation and uses probiotic tea as needed.  No swelling or tenderness in her extremities. The neuropathy in her feet is unchanged.  Her appetite is down. She is hydrating well. Her weight is stable.   ECOG Performance Status: 1 - Symptomatic but completely ambulatory  Medications:  Allergies as of 12/11/2016      Reactions   Sulfa Antibiotics Rash      Medication List        Accurate as of 12/11/16  1:17 PM. Always use your most recent med list.          5-HTP 100 MG Caps Take 1 capsule by mouth at bedtime.   aspirin EC 81 MG tablet Take 1 tablet (81 mg total) by mouth daily.   baclofen 10 MG tablet Commonly known as:  LIORESAL TAKE 1 TABLET BY MOUTH DAILY AT 8AM AND 3PM   buPROPion 300 MG 24 hr tablet Commonly known as:  WELLBUTRIN XL Take 300 mg by mouth daily.   [employer]S ALLERGY RELIEF D 60-120 MG 12 hr tablet Generic drug:  fexofenadine-pseudoephedrine Take 1 tablet by mouth 2 (two) times daily.   diclofenac sodium 1 % Gel Commonly known as:  VOLTAREN Voltaren Gel 3 grams to 3 large joints  upto TID 5 TUBES with 3 refills   fluticasone 0.05 % cream Commonly known as:  CUTIVATE   fluticasone 50 MCG/ACT nasal spray Commonly known as:  FLONASE USE 2 SPRAYS IN EACH NOSTRIL AT NIGHT   HYDROcodone-acetaminophen 5-325 MG tablet Commonly known as:  NORCO/VICODIN Take 1 tablet by mouth every 8 (eight) hours as needed. for pain   MELATONIN PO Take 1 capsule by mouth at bedtime.   sitaGLIPtin-metformin 50-1000 MG tablet Commonly known as:  JANUMET Take 1 tablet by mouth 2 (two) times daily with a meal.   traZODone 50 MG tablet Commonly known as:  DESYREL TAKE 1 TABLET BY MOUTH AT BEDTIME   Vitamin D (Ergocalciferol) 50000 units Caps capsule Commonly known as:  DRISDOL TAKE 1 CAPSULE (50,000 UNITS TOTAL) BY MOUTH ONCE A WEEK.       Allergies:  Allergies  Allergen Reactions  . Sulfa Antibiotics Rash    Past Medical History, Surgical history, Social history, and Family History were reviewed and updated.  Review of Systems: All other 10 point review of systems is negative.   Physical Exam:  height is 5\' 3"  (1.6 m) and weight is 134 lb (60.8 kg). Her oral temperature is 98.4 F (36.9 C). Her blood pressure is 151/59 (abnormal) and her pulse is 69. Her respiration is 16.   Wt Readings from Last  3 Encounters:  12/11/16 134 lb (60.8 kg)  11/24/16 132 lb 12.8 oz (60.2 kg)  10/27/16 146 lb (66.2 kg)    Ocular: Sclerae unicteric, pupils equal, round and reactive to light Ear-nose-throat: Oropharynx clear, dentition fair Lymphatic: No cervical or supraclavicular adenopathy Lungs no rales or rhonchi, good excursion bilaterally Heart regular rate and rhythm, no murmur appreciated Abd soft, nontender, positive bowel sounds MSK no focal spinal tenderness, no joint edema Neuro: non-focal, well-oriented, appropriate affect Breasts: No changes. No mass, lesion or rash noted. Left breast lumpectomy site intact.   Lab Results  Component Value Date   WBC 8.0 12/11/2016    HGB 11.5 (L) 12/11/2016   HCT 35.2 12/11/2016   MCV 85 12/11/2016   PLT 333 12/11/2016   Lab Results  Component Value Date   FERRITIN 25 04/23/2012   IRON 64 04/23/2012   TIBC 372 04/23/2012   UIBC 308 04/23/2012   IRONPCTSAT 17 (L) 04/23/2012   Lab Results  Component Value Date   RETICCTPCT 1.3 04/23/2012   RBC 4.12 12/11/2016   No results found for: KPAFRELGTCHN, LAMBDASER, KAPLAMBRATIO No results found for: IGGSERUM, IGA, IGMSERUM No results found for: Ronnald Ramp, A1GS, Nelida Meuse, SPEI   Chemistry      Component Value Date/Time   NA 138 12/13/2015 1352   K 4.1 12/13/2015 1352   CL 102 11/20/2013 1446   CO2 23 12/13/2015 1352   BUN 17.2 12/13/2015 1352   CREATININE 0.8 12/13/2015 1352      Component Value Date/Time   CALCIUM 9.7 12/13/2015 1352   ALKPHOS 79 12/13/2015 1352   AST 14 12/13/2015 1352   ALT 13 12/13/2015 1352   BILITOT <0.22 12/13/2015 1352      Impression and Plan: Linda [name] is a very pleasant 66 yo African American female with history of stage IIA (T2 N0 M0) ductal carcinoma of the left breast, triple negative. She was diagnosed and treated over 15 years ago. She had a lumpectomy followed by chemotherapy (Adriamycin/Cytoxan). She continues to do well and so far there has been no evidence of recurrence.  We will continue to follow along with her and plan to see her back again in 1 year for follow-up and lab.  She will contact our office with any questions or concerns. We can certainly see her sooner if need be.   Eliezer Bottom, NP 11/16/20181:17 PM

## 2016-12-12 LAB — VITAMIN D 25 HYDROXY (VIT D DEFICIENCY, FRACTURES): Vitamin D, 25-Hydroxy: 42.9 ng/mL (ref 30.0–100.0)

## 2017-01-11 ENCOUNTER — Other Ambulatory Visit: Payer: Self-pay | Admitting: Family

## 2017-02-11 ENCOUNTER — Other Ambulatory Visit: Payer: Self-pay | Admitting: Pharmacy Technician

## 2017-02-11 ENCOUNTER — Other Ambulatory Visit: Payer: Self-pay

## 2017-02-11 NOTE — Patient Outreach (Signed)
[facility]) Care Management  02/11/2017  Linda [name] Jun 10, 1950 [id number]   Received [name] (Janumet) patient assistance referral from [place]. Mailed patient and provider application portions to patient and Dr. Lucianne Lei.  Maud Deed [place], [place] Management [phone]

## 2017-02-11 NOTE — Patient Outreach (Signed)
[facility]) Care Management  02/11/2017  Linda [name] 1950-02-26 [id number]   Linda [name] is showing she is taking Janumet 100/1000 she may qualifies for Patient Assistance call patient to see if she wants this assistance she ask what did she need it to do get the assistance, I told Linda [name] we need  to summit an application to [place] that makes [place] with her income patient ask if we can mail all this information to her. I told Linda [name] that we will be mailing the application.and I will be refering this information to [facility] for better assistance with  Linda [name] application  process.   [facility] Management Direct Dial [phone]  Fax [phone] [email or web address]

## 2017-02-15 ENCOUNTER — Other Ambulatory Visit: Payer: Self-pay | Admitting: Pharmacy Technician

## 2017-02-15 NOTE — Patient Outreach (Signed)
[facility]) Care Management  02/15/2017  Linda [name] 1951/01/22 [id number]   Unsuccessful outreach call #1 to patient in reference to Linda [name]) patient application mailed out on 02/12/17.  Maud Deed [place], [place] Management [phone]

## 2017-02-17 ENCOUNTER — Other Ambulatory Visit: Payer: Self-pay | Admitting: Pharmacy Technician

## 2017-02-17 NOTE — Patient Outreach (Signed)
[place] [facility]) Care Management  02/17/2017  Linda [name] [birth year] [id number]   Unsuccessful outreach call #2 in reference to [name] patient assistance application.  Maud Deed [place], [place] Management [phone]

## 2017-02-19 ENCOUNTER — Ambulatory Visit: Payer: Self-pay | Admitting: Pharmacy Technician

## 2017-02-23 ENCOUNTER — Other Ambulatory Visit: Payer: Self-pay | Admitting: Pharmacy Technician

## 2017-02-23 NOTE — Patient Outreach (Signed)
[facility]) Care Management  02/23/2017  Linda [name] 1950-05-06 [id number]   Successful outreach call to patient in reference to [employer] patient assistance however, she requested I contact her at another time due to having a headache.   Maud Deed [place], [place] Management [phone]

## 2017-02-24 ENCOUNTER — Other Ambulatory Visit: Payer: Self-pay | Admitting: Otolaryngology

## 2017-02-24 DIAGNOSIS — J329 Chronic sinusitis, unspecified: Secondary | ICD-10-CM

## 2017-03-05 ENCOUNTER — Other Ambulatory Visit: Payer: Self-pay | Admitting: Pharmacy Technician

## 2017-03-05 ENCOUNTER — Ambulatory Visit
Admission: RE | Admit: 2017-03-05 | Discharge: 2017-03-05 | Disposition: A | Discharge status: Home / Self Care | Payer: Medicare Other | Source: Ambulatory Visit | Attending: Otolaryngology | Admitting: Otolaryngology

## 2017-03-05 DIAGNOSIS — J329 Chronic sinusitis, unspecified: Secondary | ICD-10-CM

## 2017-03-05 MED ORDER — GADOBENATE DIMEGLUMINE 529 MG/ML IV SOLN
12.0000 mL | Freq: Once | INTRAVENOUS | Status: AC | PRN
  Administered 2017-03-05: 12 mL via INTRAVENOUS

## 2017-03-05 NOTE — Patient Outreach (Signed)
[place] [facility]) Care Management  03/05/2017  Linda [name] 08-14-1950 [id number]   Successful outreach call to patient in reference to [name] patient assistance. Patient stated she is no longer interested in applying for assistance.   Will remove myself from care team  Maud Deed. [place], [place] Management [phone]

## 2017-04-11 ENCOUNTER — Other Ambulatory Visit: Payer: Self-pay | Admitting: Rheumatology

## 2017-04-12 NOTE — Telephone Encounter (Signed)
Last Visit: 10/27/16 Next Visit: 04/29/17  Okay to refill Trazodone?

## 2017-04-12 NOTE — Telephone Encounter (Signed)
ok 

## 2017-04-19 NOTE — Progress Notes (Signed)
Office Visit Note  Patient: Linda [name]             Date of Birth: [birth year]           MRN: [id number]             PCP: Lucianne Lei, MD Referring: Lucianne Lei, MD Visit Date: 04/29/2017 Occupation: @GUAROCC @    Subjective:  Neck pain   History of Present Illness: STEPHINE LANGBEHN is a 67 y.o. female with history of fibromyalgia and osteoarthritis.  Patient states that she continues to have generalized muscle tenderness and tension due to her fibromyalgia.  She states that her insomnia has improved.  She takes trazodone at bedtime.  She states she continues to have fatigue.  She states that since increasing her dose of hydrocodone and taking ibuprofen more frequently she is having increased drowsiness throughout the day.  She continues to go to pain management.  She states that her neurologist referred her to physical therapist to work on neck exercises to help with her frequent headaches.  She states that she has attended 4 sessions.  She states that the physical therapy has helped with her headaches but has been causing more generalized pain.  She states that she has significant tension and tenderness in the trapezius region bilaterally.  She has radiation of pain down her right arm coming from her neck.  She reports she has a lot of neck stiffness.  She states that in the past cortisone injections have helped.  She reports that her bilateral hands are stiff and uncomfortable at times.    Activities of Daily Living:  Patient reports morning stiffness for 5 minutes.   Patient Reports nocturnal pain.  Difficulty dressing/grooming: Denies Difficulty climbing stairs: Reports Difficulty getting out of chair: Reports Difficulty using hands for taps, buttons, cutlery, and/or writing: Reports   Review of Systems  Constitutional: Positive for fatigue. Negative for fever.  HENT: Negative for mouth sores, mouth dryness and nose dryness.   Eyes: Negative for visual disturbance and  dryness.  Respiratory: Negative for cough, hemoptysis, shortness of breath and difficulty breathing.   Cardiovascular: Negative for chest pain, palpitations, hypertension and swelling in legs/feet.  Gastrointestinal: Positive for constipation and diarrhea. Negative for blood in stool.  Endocrine: Negative for increased urination.  Genitourinary: Negative for difficulty urinating and painful urination.  Musculoskeletal: Positive for myalgias, muscle weakness, muscle tenderness and myalgias. Negative for arthralgias, joint pain, joint swelling and morning stiffness.  Skin: Positive for hair loss. Negative for color change, pallor, rash, nodules/bumps, skin tightness, ulcers and sensitivity to sunlight.  Allergic/Immunologic: Negative for susceptible to infections.  Neurological: Positive for numbness and headaches.  Hematological: Negative for swollen glands.  Psychiatric/Behavioral: Positive for depressed mood and sleep disturbance. The patient is not nervous/anxious.     PMFS History:  Patient Active Problem List   Diagnosis Date Noted  . Fatigue 12/10/2015  . Primary osteoarthritis of both hands 12/10/2015  . Low back pain without sciatica 12/10/2015  . Type 2 diabetes mellitus  12/10/2015  . Depression 12/10/2015  . Chronic pansinusitis 09/26/2015  . Genetic testing 01/11/2014  . Hives 11/11/2012  . Chest pain 04/23/2012  . Hyperlipidemia   . Chronic insomnia 01/29/2011  . Perennial allergic rhinitis 01/29/2011  . Fibromyalgia 01/29/2011    Past Medical History:  Diagnosis Date  . Allergic rhinitis   . Arthritis   . Breast cancer ([place]) 2002   left  . Diabetes ([place])   . Fibromyalgia   .  Glaucoma   . Hypercholesteremia   . Hyperlipidemia   . Personal history of chemotherapy 05/2000   left breast  . Personal history of radiation therapy 2002   Letf breast    Family History  Problem Relation Age of Onset  . Throat cancer Father   . Breast cancer Maternal Aunt   . CAD  Mother   . Stroke Mother   . Breast cancer Daughter    Past Surgical History:  Procedure Laterality Date  . APPENDECTOMY  1989  . BREAST BIOPSY Left 04/26/2000  . BREAST LUMPECTOMY Left 05/14/2000   left  . CHOLECYSTECTOMY  1989  . KNEE ARTHROSCOPY Right   . SHOULDER SURGERY Left   . VAGINAL HYSTERECTOMY  1987   Social History   Social History Narrative  . Not on file     Objective: Vital Signs: BP 140/70 (BP Location: Right Arm, Patient Position: Sitting, Cuff Size: Normal)   Pulse 80   Resp 18   Ht 5' 2.5" (1.588 m)   Wt 135 lb (61.2 kg)   BMI 24.30 kg/m    Physical Exam  Constitutional: She is oriented to person, place, and time. She appears well-developed and well-nourished.  HENT:  Head: Normocephalic and atraumatic.  Eyes: Conjunctivae and EOM are normal.  Neck: Normal range of motion.  Cardiovascular: Normal rate, regular rhythm, normal heart sounds and intact distal pulses.  Pulmonary/Chest: Effort normal and breath sounds normal.  Abdominal: Soft. Bowel sounds are normal.  Lymphadenopathy:    She has no cervical adenopathy.  Neurological: She is alert and oriented to person, place, and time.  Skin: Skin is warm and dry. Capillary refill takes less than 2 seconds.  Psychiatric: She has a normal mood and affect. Her behavior is normal.  Nursing note and vitals reviewed.    Musculoskeletal Exam: C-spine very limited range of motion with lateral rotation and flexion extension.  Good range of motion of thoracic and lumbar spine.  No midline spinal tenderness.  She has trapezius muscle tension and muscle tenderness bilaterally.  Shoulder joints good range of motion with some discomfort.  Elbow joints, wrist joints, MCPs, PIPs, DIPs good range of motion with no synovitis.  Hip joints, knee joints, ankle joints, MTPs, PIPs, DIPs good range of motion with no synovitis.  No warmth or effusion of bilateral knees.  She has bilateral tenderness of trochanteric  bursa.  CDAI Exam: No CDAI exam completed.    Investigation: No additional findings.   Imaging: No results found.  Speciality Comments: No specialty comments available.    Procedures:  Trigger Point Inj Date/Time: 04/29/2017 1:54 PM Performed by: Ofilia Neas, PA-C Authorized by: Ofilia Neas, PA-C   Consent Given by:  Patient Site marked: the procedure site was marked   Timeout: prior to procedure the correct patient, procedure, and site was verified   Indications:  Pain and muscle spasm Total # of Trigger Points:  2 Location: neck   Needle Size:  27 G Approach:  Dorsal (Bilateral trapezius trigger point injections) Medications #1:  0.5 mL lidocaine 1 %; 10 mg triamcinolone acetonide 40 MG/ML Medications #2:  0.5 mL lidocaine 1 %; 10 mg triamcinolone acetonide 40 MG/ML Patient tolerance:  Patient tolerated the procedure well with no immediate complications   Allergies: Sulfa antibiotics   Assessment / Plan:     Visit Diagnoses: Fibromyalgia: She continues to have generalized muscle tenderness and tension.  She has bilateral trapezius muscle tension and muscle tenderness.  She requested  trigger point  cortisone injections today in the office.  She tolerated the procedure well.  She is going to physical therapy to work on her neck range of motion.  Her neurologist has referred her to physical therapy due to her increased frequency of headaches.  She is been to 4 sessions and has noticed some improvement in her headaches but has been having increased generalized muscle tenderness.  Her insomnia has improved.  She takes trazodone at bedtime.  Her fatigue is stable.  She was encouraged to exercise on a regular basis.  Good sleep hygiene was discussed.  Trapezius muscle spasm: She has trapezius muscle tension muscle tenderness bilaterally.  She requested a cortisone injection today in the office.  She tolerated the procedure well.  She is going to physical therapy to work on her  neck range of motion.  Other fatigue: Improved.  Chronic insomnia: Improved.  She takes trazodone at bedtime.  Primary osteoarthritis of both hands: She experiences occasional discomfort and stiffness in her bilateral hands.  Joint protection and muscle strengthening were discussed.  Handout of hand exercises was provided to the patient.  Other medical conditions are listed as follows:  History of depression  History of diabetes mellitus: She was advised to monitor her blood glucose level closely following the cortisone injection today.  History of hyperlipidemia    Orders: Orders Placed This Encounter  Procedures  . Trigger Point Inj   No orders of the defined types were placed in this encounter.   Face-to-face time spent with patient was 30 minutes. >50% of time was spent in counseling and coordination of care.  Follow-Up Instructions: Return in about 6 months (around 10/29/2017) for Fibromyalgia, Osteoarthritis.   Hazel Sams PA-C   Bo Merino, MD  Note - This record has been created using Dragon software.  Chart creation errors have been sought, but may not always  have been located. Such creation errors do not reflect on  the standard of medical care.

## 2017-04-29 ENCOUNTER — Encounter: Payer: Self-pay | Admitting: Rheumatology

## 2017-04-29 ENCOUNTER — Ambulatory Visit: Payer: Medicare Other | Admitting: Physician Assistant

## 2017-04-29 VITALS — BP 140/70 | HR 80 | Resp 18 | Ht 62.5 in | Wt 135.0 lb

## 2017-04-29 DIAGNOSIS — R5383 Other fatigue: Secondary | ICD-10-CM | POA: Diagnosis not present

## 2017-04-29 DIAGNOSIS — M19042 Primary osteoarthritis, left hand: Secondary | ICD-10-CM | POA: Diagnosis not present

## 2017-04-29 DIAGNOSIS — F5104 Psychophysiologic insomnia: Secondary | ICD-10-CM

## 2017-04-29 DIAGNOSIS — M797 Fibromyalgia: Secondary | ICD-10-CM

## 2017-04-29 DIAGNOSIS — Z8639 Personal history of other endocrine, nutritional and metabolic disease: Secondary | ICD-10-CM

## 2017-04-29 DIAGNOSIS — M19041 Primary osteoarthritis, right hand: Secondary | ICD-10-CM | POA: Diagnosis not present

## 2017-04-29 DIAGNOSIS — Z8659 Personal history of other mental and behavioral disorders: Secondary | ICD-10-CM | POA: Diagnosis not present

## 2017-04-29 DIAGNOSIS — M62838 Other muscle spasm: Secondary | ICD-10-CM | POA: Diagnosis not present

## 2017-04-29 MED ORDER — TRIAMCINOLONE ACETONIDE 40 MG/ML IJ SUSP
10.0000 mg | INTRAMUSCULAR | Status: AC | PRN
  Administered 2017-04-29: 10 mg via INTRAMUSCULAR

## 2017-04-29 MED ORDER — LIDOCAINE HCL 1 % IJ SOLN
0.5000 mL | INTRAMUSCULAR | Status: AC | PRN
  Administered 2017-04-29: .5 mL

## 2017-05-10 ENCOUNTER — Other Ambulatory Visit: Payer: Self-pay | Admitting: Hematology & Oncology

## 2017-05-10 DIAGNOSIS — Z1231 Encounter for screening mammogram for malignant neoplasm of breast: Secondary | ICD-10-CM

## 2017-06-15 ENCOUNTER — Other Ambulatory Visit: Payer: Self-pay | Admitting: Otolaryngology

## 2017-06-15 DIAGNOSIS — J029 Acute pharyngitis, unspecified: Secondary | ICD-10-CM

## 2017-07-07 ENCOUNTER — Ambulatory Visit
Admission: RE | Admit: 2017-07-07 | Discharge: 2017-07-07 | Disposition: A | Discharge status: Home / Self Care | Payer: Medicare Other | Source: Ambulatory Visit | Attending: Otolaryngology | Admitting: Otolaryngology

## 2017-07-07 DIAGNOSIS — J029 Acute pharyngitis, unspecified: Secondary | ICD-10-CM

## 2017-07-07 MED ORDER — IOPAMIDOL (ISOVUE-300) INJECTION 61%
75.0000 mL | Freq: Once | INTRAVENOUS | Status: AC | PRN
  Administered 2017-07-07: 75 mL via INTRAVENOUS

## 2017-07-20 ENCOUNTER — Ambulatory Visit
Admission: RE | Admit: 2017-07-20 | Discharge: 2017-07-20 | Disposition: A | Discharge status: Home / Self Care | Payer: Medicare Other | Source: Ambulatory Visit | Attending: Hematology & Oncology | Admitting: Hematology & Oncology

## 2017-07-20 DIAGNOSIS — Z1231 Encounter for screening mammogram for malignant neoplasm of breast: Secondary | ICD-10-CM

## 2017-10-14 NOTE — Progress Notes (Signed)
Office Visit Note  Patient: Linda [name]             Date of Birth: [birth year]           MRN: [id number]             PCP: Lucianne Lei, MD Referring: Lucianne Lei, MD Visit Date: 10/28/2017 Occupation: @GUAROCC @  Subjective:  Generalized muscle aches   History of Present Illness: Linda [name] is a 67 y.o. female with history of fibromyalgia and osteoarthritis.  She reports that her fibromyalgia has been flaring more frequently.  She states that her fatigue has been worsening.  She states she sleeps about 5 hours per night.  She states that her neck and lower back wake her up at night occasionally.  She has muscle tenderness in the trapezius muscles bilaterally currently.  She states right now she is having generalized muscle aches.  She states that the trochanter bursitis bilaterally has improved significantly.  She states that she sees Dr. Maryjean Ka on a regular basis for epidural lower back injections which have helped her pain considerably.  She states that she has some discomfort and stiffness in bilateral hands.  She also has a right middle trigger finger.  She denies any joint swelling at this time.  She needs refills of baclofen 10 mg twice daily as needed for muscle spasms as well as trazodone 50 mg by mouth at bedtime.   Activities of Daily Living:  Patient reports morning stiffness for 1 minute.   Patient Reports nocturnal pain.  Difficulty dressing/grooming: Denies Difficulty climbing stairs: Reports Difficulty getting out of chair: Reports Difficulty using hands for taps, buttons, cutlery, and/or writing: Reports  Review of Systems  Constitutional: Positive for fatigue.  HENT: Positive for mouth dryness. Negative for mouth sores and nose dryness.   Eyes: Positive for dryness. Negative for pain and visual disturbance.  Respiratory: Negative for cough, hemoptysis, shortness of breath and difficulty breathing.   Cardiovascular: Negative for chest pain, palpitations,  hypertension and swelling in legs/feet.  Gastrointestinal: Positive for constipation. Negative for blood in stool and diarrhea.  Endocrine: Negative for increased urination.  Genitourinary: Negative for painful urination.  Musculoskeletal: Positive for arthralgias, joint pain, myalgias, morning stiffness, muscle tenderness and myalgias. Negative for joint swelling and muscle weakness.  Skin: Negative for color change, pallor, rash, hair loss, nodules/bumps, skin tightness, ulcers and sensitivity to sunlight.  Allergic/Immunologic: Negative for susceptible to infections.  Neurological: Negative for dizziness, numbness, headaches and weakness.  Hematological: Negative for swollen glands.  Psychiatric/Behavioral: Positive for depressed mood and sleep disturbance. The patient is nervous/anxious.     PMFS History:  Patient Active Problem List   Diagnosis Date Noted  . Fatigue 12/10/2015  . Primary osteoarthritis of both hands 12/10/2015  . Low back pain without sciatica 12/10/2015  . Type 2 diabetes mellitus  12/10/2015  . Depression 12/10/2015  . Chronic pansinusitis 09/26/2015  . Genetic testing 01/11/2014  . Hives 11/11/2012  . Chest pain 04/23/2012  . Hyperlipidemia   . Chronic insomnia 01/29/2011  . Perennial allergic rhinitis 01/29/2011  . Fibromyalgia 01/29/2011    Past Medical History:  Diagnosis Date  . Allergic rhinitis   . Arthritis   . Breast cancer ([place]) 2002   left  . Diabetes ([place])   . Fibromyalgia   . Glaucoma   . Hypercholesteremia   . Hyperlipidemia   . Personal history of chemotherapy 05/2000   left breast  . Personal history of radiation therapy 2002  Letf breast    Family History  Problem Relation Age of Onset  . Throat cancer Father   . Breast cancer Maternal Aunt   . CAD Mother   . Stroke Mother   . Breast cancer Daughter    Past Surgical History:  Procedure Laterality Date  . APPENDECTOMY  1989  . BREAST BIOPSY Left 04/26/2000  . BREAST  LUMPECTOMY Left 05/14/2000   left  . CHOLECYSTECTOMY  1989  . KNEE ARTHROSCOPY Right   . SHOULDER SURGERY Left   . VAGINAL HYSTERECTOMY  1987   Social History   Social History Narrative  . Not on file    Objective: Vital Signs: BP 108/69 (BP Location: Left Arm, Patient Position: Sitting, Cuff Size: Normal)   Pulse (!) 106   Resp 12   Ht 5\' 2"  (1.575 m)   Wt 125 lb 3.2 oz (56.8 kg)   BMI 22.90 kg/m    Physical Exam  Constitutional: She is oriented to person, place, and time. She appears well-developed and well-nourished.  HENT:  Head: Normocephalic and atraumatic.  Eyes: Conjunctivae and EOM are normal.  Neck: Normal range of motion.  Cardiovascular: Normal rate, regular rhythm, normal heart sounds and intact distal pulses.  Pulmonary/Chest: Effort normal and breath sounds normal.  Abdominal: Soft. Bowel sounds are normal.  Lymphadenopathy:    She has no cervical adenopathy.  Neurological: She is alert and oriented to person, place, and time.  Skin: Skin is warm and dry. Capillary refill takes less than 2 seconds.  Psychiatric: She has a normal mood and affect. Her behavior is normal.  Nursing note and vitals reviewed.    Musculoskeletal Exam: C-spine limited ROM.  Trapezius muscle tenderness bilaterally.  Thoracic and lumbar spine good ROM.  No midline spinal tenderness.  No SI joint tenderness.  Shoulder joints, elbow joints, wrist joints, MCPs, PIPs, and DIPs good ROM with no synovitis. She has complete fist formation bilaterally.  Right middle trigger finger. Hip joints, knee joints, ankle joints, MTPs, PIPs, and DIPs good ROM with no synovitis.  No warmth or effusion of knee joints.  No warmth or effusion of knee joints.   CDAI Exam: CDAI Score: Not documented Patient Global Assessment: Not documented; Provider Global Assessment: Not documented Swollen: Not documented; Tender: Not documented Joint Exam   Not documented   There is currently no information  documented on the homunculus. Go to the Rheumatology activity and complete the homunculus joint exam.  Investigation: No additional findings.  Imaging: No results found.  Recent Labs: Lab Results  Component Value Date   WBC 8.0 12/11/2016   HGB 11.5 (L) 12/11/2016   PLT 333 12/11/2016   NA 139 12/11/2016   K 3.9 12/11/2016   CL 104 12/11/2016   CO2 28 12/11/2016   GLUCOSE 132 (H) 12/11/2016   BUN 14 12/11/2016   CREATININE 0.72 12/11/2016   BILITOT 0.2 12/11/2016   ALKPHOS 80 12/11/2016   AST 19 12/11/2016   ALT 13 12/11/2016   PROT 7.3 12/11/2016   ALBUMIN 4.5 12/11/2016   CALCIUM 9.6 12/11/2016   GFRAA 102 12/11/2016    Speciality Comments: No specialty comments available.  Procedures:  No procedures performed Allergies: Sulfa antibiotics   Assessment / Plan:     Visit Diagnoses: Fibromyalgia: She has been having more frequent fibromyalgia flares.  She has generalized hyperalgesia on exam.  She has been having increased generalized muscle aches and muscle tenderness.  She takes baclofen 10 mg twice daily as needed for  muscle spasms.  She continues to have chronic fatigue and insomnia.  She takes trazodone 50 mg 1 tablet by mouth at bedtime.  She sleeps about 5 hours per night.  She was encouraged to try to stay active and exercise on a regular basis.  She has been seeing Dr. Maryjean Ka for epidural steroid injections which have been providing significant pain relief.  Her trochanter bursitis has improved significantly.  She continues to have tenderness and tension in bilateral trapezius muscles.  Refills of trazodone and baclofen was sent to the pharmacy today.  She will follow-up in the office in 6 months.  Other fatigue: Chronic and related to insomnia.  Chronic insomnia: She takes trazodone 50 mg 1 tablet by mouth at bedtime.  Trapezius muscle spasm: She continues to have muscle aches and muscle tenderness in the trapezius muscles bilaterally.  She uses a heating pad  occasionally.  Primary osteoarthritis of both hands: She has some discomfort in her hands.  She has increased stiffness in bilateral hands.  She has complete fist formation bilaterally.  No synovitis was noted.  Joint protection muscle strengthening were discussed.  She has a right middle trigger finger.  She was encouraged to use Voltaren gel topically in the splint the middle finger to the adjacent finger.  She will notify us if she would like to return for cortisone injection.  Greater trochanteric bursitis of both hips: Improved  Other medical conditions are listed as follows:  History of depression  History of diabetes mellitus  History of hyperlipidemia  Breast CA ([facility]CC)   Osteoporosis, post-menopausal   Orders: No orders of the defined types were placed in this encounter.  Meds ordered this encounter  Medications  . traZODone (DESYREL) 50 MG tablet    Sig: Take 1 tablet (50 mg total) by mouth at bedtime.    Dispense:  30 tablet    Refill:  2  . baclofen (LIORESAL) 10 MG tablet    Sig: TAKE 1 TABLET BY MOUTH DAILY AT 8AM AND 3PM PRN FOR MUSCLE SPASMS    Dispense:  60 tablet    Refill:  2      Follow-Up Instructions: Return in about 6 months (around 04/29/2018) for Fibromyalgia, Osteoarthritis.   Ofilia Neas, PA-C  Note - This record has been created using Dragon software.  Chart creation errors have been sought, but may not always  have been located. Such creation errors do not reflect on  the standard of medical care.

## 2017-10-28 ENCOUNTER — Ambulatory Visit: Payer: Medicare Other | Admitting: Physician Assistant

## 2017-10-28 ENCOUNTER — Encounter: Payer: Self-pay | Admitting: Physician Assistant

## 2017-10-28 VITALS — BP 108/69 | HR 106 | Resp 12 | Ht 62.0 in | Wt 125.2 lb

## 2017-10-28 DIAGNOSIS — M7061 Trochanteric bursitis, right hip: Secondary | ICD-10-CM

## 2017-10-28 DIAGNOSIS — F5104 Psychophysiologic insomnia: Secondary | ICD-10-CM | POA: Diagnosis not present

## 2017-10-28 DIAGNOSIS — R5383 Other fatigue: Secondary | ICD-10-CM

## 2017-10-28 DIAGNOSIS — M810 Age-related osteoporosis without current pathological fracture: Secondary | ICD-10-CM

## 2017-10-28 DIAGNOSIS — Z8639 Personal history of other endocrine, nutritional and metabolic disease: Secondary | ICD-10-CM

## 2017-10-28 DIAGNOSIS — M797 Fibromyalgia: Secondary | ICD-10-CM | POA: Diagnosis not present

## 2017-10-28 DIAGNOSIS — M7062 Trochanteric bursitis, left hip: Secondary | ICD-10-CM

## 2017-10-28 DIAGNOSIS — M62838 Other muscle spasm: Secondary | ICD-10-CM | POA: Diagnosis not present

## 2017-10-28 DIAGNOSIS — Z8659 Personal history of other mental and behavioral disorders: Secondary | ICD-10-CM

## 2017-10-28 DIAGNOSIS — M19041 Primary osteoarthritis, right hand: Secondary | ICD-10-CM

## 2017-10-28 DIAGNOSIS — M19042 Primary osteoarthritis, left hand: Secondary | ICD-10-CM

## 2017-10-28 MED ORDER — TRAZODONE HCL 50 MG PO TABS: 50.0000 mg | ORAL_TABLET | Freq: Every day | ORAL | 2 refills | Status: DC

## 2017-10-28 MED ORDER — BACLOFEN 10 MG PO TABS: ORAL_TABLET | ORAL | 2 refills | Status: DC

## 2017-12-10 ENCOUNTER — Other Ambulatory Visit: Payer: Self-pay

## 2017-12-10 ENCOUNTER — Inpatient Hospital Stay: Payer: Medicare Other

## 2017-12-10 ENCOUNTER — Encounter: Payer: Self-pay | Admitting: Family

## 2017-12-10 ENCOUNTER — Other Ambulatory Visit: Payer: Self-pay | Admitting: Family

## 2017-12-10 ENCOUNTER — Telehealth: Payer: Self-pay | Admitting: Hematology & Oncology

## 2017-12-10 ENCOUNTER — Inpatient Hospital Stay: Payer: Medicare Other | Attending: Hematology & Oncology | Admitting: Family

## 2017-12-10 VITALS — BP 109/48 | HR 104 | Temp 98.3°F | Resp 18 | Wt 128.0 lb

## 2017-12-10 DIAGNOSIS — N6323 Unspecified lump in the left breast, lower outer quadrant: Secondary | ICD-10-CM | POA: Insufficient documentation

## 2017-12-10 DIAGNOSIS — E559 Vitamin D deficiency, unspecified: Secondary | ICD-10-CM

## 2017-12-10 DIAGNOSIS — R1012 Left upper quadrant pain: Secondary | ICD-10-CM

## 2017-12-10 DIAGNOSIS — Z853 Personal history of malignant neoplasm of breast: Secondary | ICD-10-CM | POA: Insufficient documentation

## 2017-12-10 DIAGNOSIS — C50919 Malignant neoplasm of unspecified site of unspecified female breast: Secondary | ICD-10-CM

## 2017-12-10 DIAGNOSIS — Z9221 Personal history of antineoplastic chemotherapy: Secondary | ICD-10-CM | POA: Diagnosis not present

## 2017-12-10 LAB — CMP (CANCER CENTER ONLY)
ALT: 15 U/L (ref 0–44)
AST: 17 U/L (ref 15–41)
Albumin: 4.1 g/dL (ref 3.5–5.0)
Alkaline Phosphatase: 61 U/L (ref 38–126)
Anion gap: 9 (ref 5–15)
BUN: 31 mg/dL — ABNORMAL HIGH (ref 8–23)
CO2: 29 mmol/L (ref 22–32)
Calcium: 10.2 mg/dL (ref 8.9–10.3)
Chloride: 104 mmol/L (ref 98–111)
Creatinine: 0.83 mg/dL (ref 0.44–1.00)
GFR, Est AFR Am: >60 mL/min (ref >60)
GFR, Estimated: >60 mL/min (ref >60)
Glucose, Bld: 107 mg/dL — ABNORMAL HIGH (ref 70–99)
Potassium: 4.6 mmol/L (ref 3.5–5.1)
Sodium: 142 mmol/L (ref 135–145)
Total Bilirubin: 0.2 mg/dL — ABNORMAL LOW (ref 0.3–1.2)
Total Protein: 7.5 g/dL (ref 6.5–8.1)

## 2017-12-10 LAB — CBC WITH DIFFERENTIAL (CANCER CENTER ONLY)
Abs Immature Granulocytes: 0.03 10*3/uL (ref 0.00–0.07)
Basophils Absolute: 0 10*3/uL (ref 0.0–0.1)
Basophils Relative: 0 %
Eosinophils Absolute: 0.2 10*3/uL (ref 0.0–0.5)
Eosinophils Relative: 3 %
HCT: 37.9 % (ref 36.0–46.0)
Hemoglobin: 11.9 g/dL — ABNORMAL LOW (ref 12.0–15.0)
Immature Granulocytes: 0 %
Lymphocytes Relative: 34 %
Lymphs Abs: 2.3 10*3/uL (ref 0.7–4.0)
MCH: 27.8 pg (ref 26.0–34.0)
MCHC: 31.4 g/dL (ref 30.0–36.0)
MCV: 88.6 fL (ref 80.0–100.0)
Monocytes Absolute: 0.4 10*3/uL (ref 0.1–1.0)
Monocytes Relative: 6 %
Neutro Abs: 3.7 10*3/uL (ref 1.7–7.7)
Neutrophils Relative %: 57 %
Platelet Count: 285 10*3/uL (ref 150–400)
RBC: 4.28 MIL/uL (ref 3.87–5.11)
RDW: 14.2 % (ref 11.5–15.5)
WBC Count: 6.7 10*3/uL (ref 4.0–10.5)
nRBC: 0 % (ref 0.0–0.2)

## 2017-12-10 LAB — LACTATE DEHYDROGENASE: LDH: 170 U/L (ref 98–192)

## 2017-12-10 NOTE — Telephone Encounter (Signed)
sch pt one year lab/follow up appt per LOS. Pt sated she wanted he mammo and CT to be done at [place]. Contacted imaging downstairs to change order location so [place] [facility] can call pt to sch appts.

## 2017-12-11 LAB — VITAMIN D 25 HYDROXY (VIT D DEFICIENCY, FRACTURES): Vit D, 25-Hydroxy: 99.9 ng/mL (ref 30.0–100.0)

## 2017-12-11 NOTE — Progress Notes (Signed)
Hematology and Oncology Follow Up Visit  Linda [name] [id number] [birth year] 67 y.o. 12/11/2017   Principle Diagnosis:  Stage IIA (T2 N0 M0) ductal carcinoma of the left breast triple-negative  Current Therapy:   Observation   Interim History: Linda [name] is here today for follow-up. She has noted a nodule at the 5 o'clock position of the left breast that is palpable on exam. The area is a little tender. No redness or edema noted.  She has also continued to have left sided abdominal pain.  She has occasional hot flashes and night sweats.  She has had some sinus congestion and drainage with intermittent headaches and dizziness.  No fever, chills, n/v, cough, rash, SOB, chest pain, palpitations or changes in bowel or bladder habits.  The numbness and tingling in her hands and feet comes and goes.  No swelling or tenderness noted in her extremities at this time.  She has GERD at times but has maintained a good appetite and is staying well hydrated. Her weight is stable.   ECOG Performance Status: 1 - Symptomatic but completely ambulatory  Medications:  Allergies as of 12/10/2017      Reactions   Sulfa Antibiotics Rash      Medication List        Accurate as of 12/10/17 11:59 PM. Always use your most recent med list.          ALAWAY 0.025 % ophthalmic solution Generic drug:  ketotifen INSTILL 1 DROP INTO BOTH EYES TWICE A DAY   aspirin EC 81 MG tablet Take 1 tablet (81 mg total) by mouth daily.   baclofen 10 MG tablet Commonly known as:  LIORESAL TAKE 1 TABLET BY MOUTH DAILY AT 8AM AND 3PM PRN FOR MUSCLE SPASMS   buPROPion 300 MG 24 hr tablet Commonly known as:  WELLBUTRIN XL Take 300 mg by mouth daily.   [employer] ALLERGY RELIEF D 60-120 MG 12 hr tablet Generic drug:  fexofenadine-pseudoephedrine Take 1 tablet by mouth as needed.   dexlansoprazole 60 MG capsule Commonly known as:  DEXILANT Take 60 mg by mouth daily.   diclofenac sodium 1 % Gel Commonly known as:   VOLTAREN Voltaren Gel 3 grams to 3 large joints upto TID 5 TUBES with 3 refills   fluticasone 0.05 % cream Commonly known as:  CUTIVATE   HYDROcodone-acetaminophen 5-325 MG tablet Commonly known as:  NORCO/VICODIN Take 1 tablet by mouth every 8 (eight) hours as needed. for pain   ibuprofen 600 MG tablet Commonly known as:  ADVIL,MOTRIN TAKE 1 TABLET BY MOUTH THREE TIMES A DAY WITH FOOD   JANUMET XR [phone] MG Tb24 Generic drug:  SitaGLIPtin-MetFORMIN HCl Take 1 tablet by mouth daily.   LUMIGAN 0.01 % Soln Generic drug:  bimatoprost 1 DROP INTO BOTH EYES IN THE EVENING   MELATONIN PO Take 1 capsule by mouth at bedtime.   rosuvastatin 10 MG tablet Commonly known as:  CRESTOR Take 10 mg by mouth daily.   sucralfate 1 GM/10ML suspension Commonly known as:  CARAFATE Take 1 g by mouth 4 (four) times daily -  with meals and at bedtime.   traZODone 50 MG tablet Commonly known as:  DESYREL Take 1 tablet (50 mg total) by mouth at bedtime.   venlafaxine XR 150 MG 24 hr capsule Commonly known as:  EFFEXOR-XR Take 150 mg by mouth daily.   Vitamin D (Ergocalciferol) 1.25 MG (50000 UT) Caps capsule Commonly known as:  DRISDOL TAKE 1 CAPSULE (50,000 UNITS TOTAL) BY  MOUTH ONCE A WEEK.       Allergies:  Allergies  Allergen Reactions  . Sulfa Antibiotics Rash    Past Medical History, Surgical history, Social history, and Family History were reviewed and updated.  Review of Systems: All other 10 point review of systems is negative.   Physical Exam:  weight is 128 lb (58.1 kg). Her oral temperature is 98.3 F (36.8 C). Her blood pressure is 109/48 (abnormal) and her pulse is 104 (abnormal). Her respiration is 18 and oxygen saturation is 98%.   Wt Readings from Last 3 Encounters:  12/10/17 128 lb (58.1 kg)  10/28/17 125 lb 3.2 oz (56.8 kg)  04/29/17 135 lb (61.2 kg)    Ocular: Sclerae unicteric, pupils equal, round and reactive to light Ear-nose-throat: Oropharynx  clear, dentition fair Lymphatic: No cervical or supraclavicular adenopathy Lungs no rales or rhonchi, good excursion bilaterally Heart regular rate and rhythm, no murmur appreciated Abd soft, left sided abdominal tenderness on exam, positive bowel sounds, no liver or spleen tip palpated on exam, no fluid wave  MSK no focal spinal tenderness, no joint edema Neuro: non-focal, well-oriented, appropriate affect Breasts: No changes with right breast, left breast nodule noted at 5 o'clock position, no rash, edema or lesion noted   Lab Results  Component Value Date   WBC 6.7 12/10/2017   HGB 11.9 (L) 12/10/2017   HCT 37.9 12/10/2017   MCV 88.6 12/10/2017   PLT 285 12/10/2017   Lab Results  Component Value Date   FERRITIN 25 04/23/2012   IRON 64 04/23/2012   TIBC 372 04/23/2012   UIBC 308 04/23/2012   IRONPCTSAT 17 (L) 04/23/2012   Lab Results  Component Value Date   RETICCTPCT 1.3 04/23/2012   RBC 4.28 12/10/2017   No results found for: KPAFRELGTCHN, LAMBDASER, KAPLAMBRATIO No results found for: IGGSERUM, IGA, IGMSERUM No results found for: Odetta Pink, SPEI   Chemistry      Component Value Date/Time   NA 142 12/10/2017 1321   NA 139 12/11/2016 1252   NA 138 12/13/2015 1352   K 4.6 12/10/2017 1321   K 3.9 12/11/2016 1252   K 4.1 12/13/2015 1352   CL 104 12/10/2017 1321   CL 104 12/11/2016 1252   CO2 29 12/10/2017 1321   CO2 28 12/11/2016 1252   CO2 23 12/13/2015 1352   BUN 31 (H) 12/10/2017 1321   BUN 14 12/11/2016 1252   BUN 17.2 12/13/2015 1352   CREATININE 0.83 12/10/2017 1321   CREATININE 0.72 12/11/2016 1252   CREATININE 0.8 12/13/2015 1352      Component Value Date/Time   CALCIUM 10.2 12/10/2017 1321   CALCIUM 9.6 12/11/2016 1252   CALCIUM 9.7 12/13/2015 1352   ALKPHOS 61 12/10/2017 1321   ALKPHOS 80 12/11/2016 1252   ALKPHOS 79 12/13/2015 1352   AST 17 12/10/2017 1321   AST 14 12/13/2015 1352   ALT  15 12/10/2017 1321   ALT 13 12/13/2015 1352   BILITOT 0.2 (L) 12/10/2017 1321   BILITOT <0.22 12/13/2015 1352       Impression and Plan: Linda [name] is a very pleasant 67 yo African American female with history of stage IIa (T2N0M0) ductal carcinoma of the left breast, truple negative. She was diagnosed and treated over 15 years ago with lumpectomy and chemotherapy (Adriamycin/Cytoxan).  We will schedule her for a mammogram to further assess the nodule noted in the left breast as well as a CT of the  abdomen.  We will go ahead and plan to see her back in a year but this is subject to change based on scan findings.  She will contact our office with any questions or concerns. We can certainly see her sooner if need be.   Laverna Peace, NP 11/16/201910:17 AM

## 2017-12-13 ENCOUNTER — Telehealth: Payer: Self-pay | Admitting: Family

## 2017-12-13 NOTE — Telephone Encounter (Signed)
Spoke with imaging to change location to [facility] per pt request. GI has left message for pt to return call to sch appts per note on order

## 2017-12-17 ENCOUNTER — Ambulatory Visit
Admission: RE | Admit: 2017-12-17 | Discharge: 2017-12-17 | Disposition: A | Discharge status: Home / Self Care | Payer: Medicare Other | Source: Ambulatory Visit | Attending: Family | Admitting: Family

## 2017-12-17 DIAGNOSIS — C50919 Malignant neoplasm of unspecified site of unspecified female breast: Secondary | ICD-10-CM

## 2017-12-17 DIAGNOSIS — R1012 Left upper quadrant pain: Secondary | ICD-10-CM

## 2017-12-17 MED ORDER — IOHEXOL 300 MG/ML  SOLN
100.0000 mL | Freq: Once | INTRAMUSCULAR | Status: AC | PRN
  Administered 2017-12-17: 100 mL via INTRAVENOUS

## 2017-12-20 ENCOUNTER — Ambulatory Visit
Admission: RE | Admit: 2017-12-20 | Discharge: 2017-12-20 | Disposition: A | Discharge status: Home / Self Care | Payer: Medicare Other | Source: Ambulatory Visit | Attending: Family | Admitting: Family

## 2017-12-20 DIAGNOSIS — C50919 Malignant neoplasm of unspecified site of unspecified female breast: Secondary | ICD-10-CM

## 2018-01-05 ENCOUNTER — Encounter: Payer: Self-pay | Admitting: *Deleted

## 2018-02-04 ENCOUNTER — Ambulatory Visit (INDEPENDENT_AMBULATORY_CARE_PROVIDER_SITE_OTHER): Payer: Medicare Other

## 2018-02-04 ENCOUNTER — Other Ambulatory Visit: Payer: Self-pay | Admitting: Podiatry

## 2018-02-04 ENCOUNTER — Ambulatory Visit: Payer: Medicare Other | Admitting: Podiatry

## 2018-02-04 VITALS — BP 124/72 | HR 107

## 2018-02-04 DIAGNOSIS — M7752 Other enthesopathy of left foot: Secondary | ICD-10-CM

## 2018-02-04 DIAGNOSIS — M79674 Pain in right toe(s): Secondary | ICD-10-CM

## 2018-02-04 DIAGNOSIS — M79671 Pain in right foot: Secondary | ICD-10-CM

## 2018-02-04 DIAGNOSIS — M79675 Pain in left toe(s): Secondary | ICD-10-CM | POA: Diagnosis not present

## 2018-02-04 DIAGNOSIS — M2580 Other specified joint disorders, unspecified joint: Secondary | ICD-10-CM

## 2018-02-04 DIAGNOSIS — M7751 Other enthesopathy of right foot: Secondary | ICD-10-CM

## 2018-02-04 DIAGNOSIS — M79672 Pain in left foot: Secondary | ICD-10-CM

## 2018-02-04 DIAGNOSIS — Z981 Arthrodesis status: Secondary | ICD-10-CM

## 2018-02-06 NOTE — Progress Notes (Signed)
Subjective:  Patient ID: Linda [name], female    DOB: [birth year],  MRN: [id number]  Chief Complaint  Patient presents with  . Toe Pain    Bilateral 1st toe and sub 1st toe pain. Left 1st was numb since childhood but feeling has returned in past 3 weeks and has aching/throbbing pain. Left 1st toe and sub 1st has pain as well, but less intense. Right 1st toe has medial toe pain, 3 days duration, tender to touch.    68 y.o. female presents with the above complaint.   Review of Systems: Negative except as noted in the HPI. Denies N/V/F/Ch.  Past Medical History:  Diagnosis Date  . Allergic rhinitis   . Arthritis   . Breast cancer ([place]) 2002   left  . Diabetes ([place])   . Fibromyalgia   . Glaucoma   . Hypercholesteremia   . Hyperlipidemia   . Personal history of chemotherapy 05/2000   left breast  . Personal history of radiation therapy 2002   Letf breast    Current Outpatient Medications:  .  ALAWAY 0.025 % ophthalmic solution, INSTILL 1 DROP INTO BOTH EYES TWICE A DAY, Disp: , Rfl: 3 .  aspirin EC 81 MG tablet, Take 1 tablet (81 mg total) by mouth daily., Disp: , Rfl:  .  baclofen (LIORESAL) 10 MG tablet, TAKE 1 TABLET BY MOUTH DAILY AT 8AM AND 3PM PRN FOR MUSCLE SPASMS, Disp: 60 tablet, Rfl: 2 .  buPROPion (WELLBUTRIN XL) 300 MG 24 hr tablet, Take 300 mg by mouth daily.  , Disp: , Rfl:  .  [employer]S ALLERGY RELIEF D 60-120 MG 12 hr tablet, Take 1 tablet by mouth as needed. , Disp: , Rfl: 2 .  dexlansoprazole (DEXILANT) 60 MG capsule, Take 60 mg by mouth daily., Disp: , Rfl:  .  diclofenac sodium (VOLTAREN) 1 % GEL, Voltaren Gel 3 grams to 3 large joints upto TID 5 TUBES with 3 refills, Disp: 5 Tube, Rfl: 3 .  fluticasone (CUTIVATE) 0.05 % cream, , Disp: , Rfl:  .  HYDROcodone-acetaminophen (NORCO/VICODIN) 5-325 MG tablet, Take 1 tablet by mouth every 8 (eight) hours as needed. for pain, Disp: , Rfl: 0 .  ibuprofen (ADVIL,MOTRIN) 600 MG tablet, TAKE 1 TABLET BY MOUTH THREE TIMES A  DAY WITH FOOD, Disp: , Rfl:  .  JANUMET XR [phone] MG TB24, Take 1 tablet by mouth daily., Disp: , Rfl: 3 .  LUMIGAN 0.01 % SOLN, 1 DROP INTO BOTH EYES IN THE EVENING, Disp: , Rfl: 6 .  Nutritional Supplements (MELATONIN PO), Take 1 capsule by mouth at bedtime.  , Disp: , Rfl:  .  rosuvastatin (CRESTOR) 10 MG tablet, Take 10 mg by mouth daily., Disp: , Rfl:  .  sucralfate (CARAFATE) 1 GM/10ML suspension, Take 1 g by mouth 4 (four) times daily -  with meals and at bedtime., Disp: , Rfl:  .  traZODone (DESYREL) 50 MG tablet, Take 1 tablet (50 mg total) by mouth at bedtime., Disp: 30 tablet, Rfl: 2 .  venlafaxine XR (EFFEXOR-XR) 150 MG 24 hr capsule, Take 150 mg by mouth daily., Disp: , Rfl: 2 .  Vitamin D, Ergocalciferol, (DRISDOL) 50000 units CAPS capsule, TAKE 1 CAPSULE (50,000 UNITS TOTAL) BY MOUTH ONCE A WEEK., Disp: 30 capsule, Rfl: 1  Social History   Tobacco Use  Smoking Status Former Smoker  . Packs/day: 1.50  . Years: 25.00  . Pack years: 37.50  . Types: Cigarettes  . Start date: 04/20/1973  .  Last attempt to quit: 01/27/1988  . Years since quitting: 30.0  Smokeless Tobacco Never Used  Tobacco Comment   quit smoking 15 years ago    Allergies  Allergen Reactions  . Sulfa Antibiotics Rash   Objective:   Vitals:   02/04/18 1111  BP: 124/72  Pulse: (!) 107   There is no height or weight on file to calculate BMI. Constitutional Well developed. Well nourished.  Vascular Dorsalis pedis pulses palpable bilaterally. Posterior tibial pulses palpable bilaterally. Capillary refill normal to all digits.  No cyanosis or clubbing noted. Pedal hair growth normal.  Neurologic Normal speech. Oriented to person, place, and time. Epicritic sensation to light touch grossly present bilaterally.  Dermatologic Nails well groomed and normal in appearance. No open wounds. No skin lesions.  Orthopedic: Normal joint ROM without pain or crepitus bilaterally. No visible deformities. POP  tibial sesamoid bilat Left 1st Toe fusion in plantarflexion noted. No IPJ motion, clinically fused.   Radiographs: Taken and reviewed evidence of prior fusion of the left hallux IPJ, subtalar joint Assessment:   1. Sesamoiditis   2. History of surgical fusion joint   3. Pain in toes of both feet    Plan:  Patient was evaluated and treated and all questions answered.  Sesamoiditis Bilat -Injection delivered as below -I think her symptoms are more musculoskeletal than nerve related -Discussed in the future she may benefit from realignment of the left first toe as I do think that the fusion position is causing her pain  Procedure: Joint Injection Location: Bilateral sesamoid complex Skin Prep: Alcohol. Injectate: 0.5 cc 1% lidocaine plain, 0.5 cc dexamethasone phosphate. Disposition: Patient tolerated procedure well. Injection site dressed with a band-aid.   Return in about 3 weeks (around 02/25/2018) for Sesamoiditis Injection f/u.

## 2018-02-08 ENCOUNTER — Other Ambulatory Visit: Payer: Self-pay | Admitting: *Deleted

## 2018-02-08 MED ORDER — VITAMIN D (ERGOCALCIFEROL) 1.25 MG (50000 UNIT) PO CAPS: 50000.0000 [IU] | ORAL_CAPSULE | ORAL | 1 refills | Status: DC

## 2018-02-24 ENCOUNTER — Other Ambulatory Visit: Payer: Medicare Other | Admitting: Orthotics

## 2018-02-24 ENCOUNTER — Ambulatory Visit: Payer: Medicare Other | Admitting: Podiatry

## 2018-03-10 ENCOUNTER — Other Ambulatory Visit: Payer: Medicare Other | Admitting: Orthotics

## 2018-03-10 ENCOUNTER — Ambulatory Visit: Payer: Medicare Other | Admitting: Podiatry

## 2018-04-25 NOTE — Progress Notes (Deleted)
Virtual Visit via Video Note  I connected with Linda [name] on 04/28/18 at  9:45 AM EDT by a video enabled telemedicine application and verified that I am speaking with the correct person using two identifiers.   I discussed the limitations of evaluation and management by telemedicine and the availability of in person appointments. The patient expressed understanding and agreed to proceed.  CC: Neck pain  History of Present Illness: Patient is a 68 year old with a past medical history of fibromyalgia and osteoarthritis.  She takes Baclofen 10 mg po PRN for muscle spasms. She states she has intermittent bilateral knee joints.  She denies any joint swelling.  She has chronic neck pain and trapezius muscle tension.  She continues to have bilateral trochanteric bursitis.  She has chronic fatigue. She is taking Trazodone 50 mg 1 tablet po at bedtime to help with insomnia. Trazodone has been effective for her. She states her fibromyalgia pain a 3/10 currently.    Review of Systems  Constitutional: Positive for malaise/fatigue. Negative for fever.  HENT:       +mouth dryness  Eyes: Negative for photophobia, pain, discharge and redness.       +eye dryness  Respiratory: Negative for cough, shortness of breath and wheezing.   Cardiovascular: Negative for chest pain and palpitations.  Gastrointestinal: Negative for blood in stool, constipation and diarrhea.  Genitourinary: Negative for dysuria.  Musculoskeletal: Positive for joint pain, myalgias and neck pain. Negative for back pain.  Skin: Negative for rash.  Neurological: Negative for dizziness and headaches.  Psychiatric/Behavioral: Positive for depression (taking trazodone). The patient is nervous/anxious and has insomnia.     Observations/Objective: Physical Exam  Constitutional: She is oriented to person, place, and time and well-developed, well-nourished, and in no distress.  HENT:  Head: Normocephalic and atraumatic.  Eyes:  Conjunctivae are normal.  Pulmonary/Chest: Effort normal.  Neurological: She is alert and oriented to person, place, and time.  Psychiatric: Mood, memory, affect and judgment normal.   Patient reports morning stiffness for 30  minutes.   Patient reports nocturnal pain.  Difficulty dressing/grooming: Denies Difficulty climbing stairs: Denies Difficulty getting out of chair: Denies Difficulty using hands for taps, buttons, cutlery, and/or writing: Denies   Assessment and Plan: Fibromyalgia: She continues to have generalized muscle aches and muscle tenderness.  She has trapezius muscle spasms bilaterally.  She takes Baclofen 10 mg po at bedtime to help with muscle spasms.  She continues to have bilateral trochanteric bursitis.  She was encouraged to perform stretching exercises regularly. She continues to have chronic fatigue.  She has been sleeping better at night taking trazodone 50 mg 1 tablet po at bedtime.   The need for regular exercise and good sleep hygiene was discussed.  She will follow up in 6 months.  Other fatigue: Chronic and related to insomnia.  She was encouraged to stay active and exercise on a regular basis.    Chronic insomnia: She takes trazodone 50 mg 1 tablet by mouth at bedtime.  Trazodone has been effective for her.  She does not need any refills at this time.   Trapezius muscle spasm: She has trapezius muscle tension and muscle spasms. She takes baclofen 10 mg po at bedtime PRN for muscle spasms, which has been effective.  She does not need a refill at this time.   Primary osteoarthritis of both hands: She has no hand pain or swelling at this time.   Greater trochanteric bursitis of both hips: She has  intermittent symptoms of bursitis bilaterally.  She was encouraged to perform stretching exercises.   Chronic pain of both knee joints: She has intermittent pain in both knee joints.  She has no joint swelling. She has not had x-rays recently.  We will obtain X-rays  of both knee joints at her next visit. She declined for [place] to send her knee joint exercises in the mail at this time. She can use voltaren gel topically.  A refill of voltaren gel will be sent to the pharmacy.   Follow Up Instructions: She will follow up in our office in 6 months. Refill of voltaren gel was sent to the pharmacy.    I discussed the assessment and treatment plan with the patient. The patient was provided an opportunity to ask questions and all were answered. The patient agreed with the plan and demonstrated an understanding of the instructions.   The patient was advised to [name] back or seek an in-person evaluation if the symptoms worsen or if the condition fails to improve as anticipated.  I provided 22 minutes of non-face-to-face time during this encounter.   Ofilia Neas, PA-C

## 2018-04-28 ENCOUNTER — Encounter: Payer: Self-pay | Admitting: Rheumatology

## 2018-04-28 ENCOUNTER — Telehealth (INDEPENDENT_AMBULATORY_CARE_PROVIDER_SITE_OTHER): Payer: Medicare Other | Admitting: Rheumatology

## 2018-04-28 DIAGNOSIS — M19041 Primary osteoarthritis, right hand: Secondary | ICD-10-CM

## 2018-04-28 DIAGNOSIS — M797 Fibromyalgia: Secondary | ICD-10-CM | POA: Diagnosis not present

## 2018-04-28 DIAGNOSIS — M25561 Pain in right knee: Secondary | ICD-10-CM

## 2018-04-28 DIAGNOSIS — G8929 Other chronic pain: Secondary | ICD-10-CM

## 2018-04-28 DIAGNOSIS — M62838 Other muscle spasm: Secondary | ICD-10-CM

## 2018-04-28 DIAGNOSIS — M7062 Trochanteric bursitis, left hip: Secondary | ICD-10-CM

## 2018-04-28 DIAGNOSIS — R5383 Other fatigue: Secondary | ICD-10-CM

## 2018-04-28 DIAGNOSIS — F5104 Psychophysiologic insomnia: Secondary | ICD-10-CM | POA: Diagnosis not present

## 2018-04-28 DIAGNOSIS — Z8659 Personal history of other mental and behavioral disorders: Secondary | ICD-10-CM

## 2018-04-28 DIAGNOSIS — M7061 Trochanteric bursitis, right hip: Secondary | ICD-10-CM

## 2018-04-28 DIAGNOSIS — Z8639 Personal history of other endocrine, nutritional and metabolic disease: Secondary | ICD-10-CM

## 2018-04-28 DIAGNOSIS — M19042 Primary osteoarthritis, left hand: Secondary | ICD-10-CM

## 2018-04-28 DIAGNOSIS — M25562 Pain in left knee: Secondary | ICD-10-CM

## 2018-04-28 MED ORDER — DICLOFENAC SODIUM 1 % TD GEL: TRANSDERMAL | 2 refills | Status: DC

## 2018-04-28 NOTE — Progress Notes (Signed)
Virtual Visit via Telephone Note  I connected with Linda [name] on 04/28/18 at  9:45 AM EDT by telephone and verified that I am speaking with the correct person using two identifiers.   I discussed the limitations, risks, security and privacy concerns of performing an evaluation and management service by telephone and the availability of in person appointments. I also discussed with the patient that there may be a patient responsible charge related to this service. The patient expressed understanding and agreed to proceed.  CC: neck pain  History of Present Illness: Patient is a 68 year old with a past medical history of fibromyalgia and osteoarthritis.  She takes Baclofen 10 mg po PRN for muscle spasms. She states she has intermittent bilateral knee joints.  She denies any joint swelling.  She has chronic neck pain and trapezius muscle tension.  She continues to have bilateral trochanteric bursitis.  She has chronic fatigue. She is taking Trazodone 50 mg 1 tablet po at bedtime to help with insomnia. Trazodone has been effective for her. She states her fibromyalgia pain a 3/10 currently.    Review of Systems  Constitutional: Positive for malaise/fatigue. Negative for fever.  HENT:       +mouth dryness  Eyes: Negative for photophobia, pain, discharge and redness.       +eye dryness  Respiratory: Negative for cough, shortness of breath and wheezing.   Cardiovascular: Negative for chest pain and palpitations.  Gastrointestinal: Negative for blood in stool, constipation and diarrhea.  Genitourinary: Negative for dysuria.  Musculoskeletal: Positive for joint pain, myalgias and neck pain. Negative for back pain.  Skin: Negative for rash.  Neurological: Negative for dizziness and headaches.  Psychiatric/Behavioral: Positive for depression (taking trazodone). The patient is nervous/anxious and has insomnia.    Observations/Objective: Physical Exam  Constitutional: She is oriented to person,  place, and time and well-developed, well-nourished, and in no distress.  Neurological: She is alert and oriented to person, place, and time.  Psychiatric: Mood, memory, affect and judgment normal.   Patient reports morning stiffness for 30  minutes.   Patient reports nocturnal pain.  Difficulty dressing/grooming: Denies Difficulty climbing stairs: Denies Difficulty getting out of chair: Denies Difficulty using hands for taps, buttons, cutlery, and/or writing: Denies  Assessment and Plan: Fibromyalgia:She continues to have generalized muscle aches and muscle tenderness.  She has trapezius muscle spasms bilaterally.  She takes Baclofen 10 mg po at bedtime to help with muscle spasms.  She continues to have bilateral trochanteric bursitis.  She was encouraged to perform stretching exercises regularly. She continues to have chronic fatigue.  She has been sleeping better at night taking trazodone 50 mg 1 tablet po at bedtime.   The need for regular exercise and good sleep hygiene was discussed.  She will follow up in 6 months.  Other fatigue:Chronic and related to insomnia.  She was encouraged to stay active and exercise on a regular basis.    Chronic insomnia:She takes trazodone 50 mg 1 tablet by mouth at bedtime.  Trazodone has been effective for her.  She does not need any refills at this time.   Trapezius muscle spasm:She has trapezius muscle tension and muscle spasms. She takes baclofen 10 mg po at bedtime PRN for muscle spasms, which has been effective.  She does not need a refill at this time.   Primary osteoarthritis of both hands: She has no hand pain or swelling at this time.   Greater trochanteric bursitis of both hips: She has  intermittent symptoms of bursitis bilaterally.  She was encouraged to perform stretching exercises.   Chronic pain of both knee joints: She has intermittent pain in both knee joints.  She has no joint swelling. She has not had x-rays recently.  We will  obtain X-rays of both knee joints at her next visit. She declined for [place] to send her knee joint exercises in the mail at this time. She can use voltaren gel topically.  A refill of voltaren gel will be sent to the pharmacy.   Follow Up Instructions: She will follow up in our office in 6 months. Refill of voltaren gel was sent to the pharmacy.     I discussed the assessment and treatment plan with the patient. The patient was provided an opportunity to ask questions and all were answered. The patient agreed with the plan and demonstrated an understanding of the instructions.   The patient was advised to [name] back or seek an in-person evaluation if the symptoms worsen or if the condition fails to improve as anticipated.  I provided 22 minutes of non-face-to-face time during this encounter.  Bo Merino, MD  Scribed by- Ofilia Neas, PA-C

## 2018-04-29 ENCOUNTER — Telehealth: Payer: Self-pay | Admitting: Rheumatology

## 2018-04-29 NOTE — Telephone Encounter (Signed)
-----   Message from [place] sent at 04/28/2018 11:37 AM EDT ----- Patient had a virtual visit today with Dr. Estanislado Pandy. Please call to schedule 6 month follow up. Thanks!

## 2018-04-29 NOTE — Telephone Encounter (Signed)
I LMOM for patient to call, and schedule a rov for 6 months with Lovena Le.

## 2018-05-09 ENCOUNTER — Other Ambulatory Visit: Payer: Self-pay | Admitting: Physician Assistant

## 2018-05-09 DIAGNOSIS — M810 Age-related osteoporosis without current pathological fracture: Secondary | ICD-10-CM

## 2018-05-10 NOTE — Telephone Encounter (Signed)
Ok to refill 

## 2018-05-10 NOTE — Telephone Encounter (Signed)
Last Visit: 04/28/2018 telemedicine  Next Visit: front desk has message to schedule follow up.   Okay to refill per Dr. Estanislado Pandy.

## 2018-07-11 ENCOUNTER — Other Ambulatory Visit: Payer: Self-pay | Admitting: Physician Assistant

## 2018-07-12 NOTE — Telephone Encounter (Signed)
Last Visit: 04/28/2018 telemedicine  Next Visit: message sent to the front desk to schedule.   Okay to refill per Dr. Estanislado Pandy.

## 2018-07-26 ENCOUNTER — Other Ambulatory Visit: Payer: Self-pay | Admitting: Hematology & Oncology

## 2018-07-26 DIAGNOSIS — Z1231 Encounter for screening mammogram for malignant neoplasm of breast: Secondary | ICD-10-CM

## 2018-07-27 ENCOUNTER — Other Ambulatory Visit: Payer: Self-pay | Admitting: Physician Assistant

## 2018-07-27 NOTE — Telephone Encounter (Signed)
Please schedule patient for a follow up visit. Patient due October 2020. Thanks!

## 2018-07-27 NOTE — Telephone Encounter (Signed)
Last Visit: 04/28/2018 telemedicine  Next Visit: due October 2020. Message sent to front to schedule patient.  Okay to refill per Dr. Estanislado Pandy

## 2018-07-27 NOTE — Telephone Encounter (Signed)
[facility] for patient to call and schedule follow-up appointment in October 2020.

## 2018-09-07 ENCOUNTER — Ambulatory Visit: Payer: Medicare Other

## 2018-09-28 NOTE — Progress Notes (Signed)
Office Visit Note  Patient: Linda [name]             Date of Birth: June 28, 1950           MRN: [id number]             PCP: Lucianne Lei, MD Referring: Lucianne Lei, MD Visit Date: 09/29/2018 Occupation: @GUAROCC @  Subjective:  Pain in both knee joints   History of Present Illness: Linda [name] is a 68 y.o. female with history of fibromyalgia and osteoarthritis.  She continues to generalized muscle aches and muscle tenderness due to fibromyalgia.  She has been having increased myalgias due to being under increased stress since she is preparing to move.  She continues to take baclofen 10 mg twice daily for muscle spasms and takes trazodone 50 mg 1 tablet by mouth at bedtime for insomnia.  She presents today with pain in bilateral knee joints.  She states that 2 to 3 days ago she was bending down to pick something up off the floor and felt a sharp pain in the left knee joint.  She denies any mechanical symptoms.  She states that she has noted some swelling and warmth in the left knee but denies any bruising or redness.  She has been using Voltaren gel and applying heat.  She continues to take hydrocodone as needed for pain relief.    Activities of Daily Living:  Patient reports morning stiffness for up to 2 hours.   Patient Reports nocturnal pain.  Difficulty dressing/grooming: Denies Difficulty climbing stairs: Reports Difficulty getting out of chair: Reports Difficulty using hands for taps, buttons, cutlery, and/or writing: Denies  Review of Systems  Constitutional: Positive for fatigue.  HENT: Positive for mouth dryness. Negative for mouth sores and nose dryness.   Eyes: Positive for pain, itching and dryness. Negative for visual disturbance.  Respiratory: Negative for cough, hemoptysis, shortness of breath, wheezing and difficulty breathing.   Cardiovascular: Negative for chest pain, palpitations, hypertension and swelling in legs/feet.  Gastrointestinal: Positive for  constipation. Negative for blood in stool and diarrhea.  Endocrine: Negative for increased urination.  Genitourinary: Negative for difficulty urinating and painful urination.  Musculoskeletal: Positive for arthralgias, joint pain, joint swelling and morning stiffness. Negative for myalgias, muscle weakness, muscle tenderness and myalgias.  Skin: Negative for color change, pallor, rash, hair loss, nodules/bumps, redness, skin tightness, ulcers and sensitivity to sunlight.  Allergic/Immunologic: Negative for susceptible to infections.  Neurological: Positive for headaches. Negative for dizziness, light-headedness, numbness, memory loss and weakness.  Hematological: Negative for bruising/bleeding tendency and swollen glands.  Psychiatric/Behavioral: Negative for depressed mood, confusion and sleep disturbance. The patient is not nervous/anxious.     PMFS History:  Patient Active Problem List   Diagnosis Date Noted  . Fatigue 12/10/2015  . Primary osteoarthritis of both hands 12/10/2015  . Low back pain without sciatica 12/10/2015  . Type 2 diabetes mellitus  12/10/2015  . Depression 12/10/2015  . Chronic pansinusitis 09/26/2015  . Genetic testing 01/11/2014  . Hives 11/11/2012  . Chest pain 04/23/2012  . Hyperlipidemia   . Chronic insomnia 01/29/2011  . Perennial allergic rhinitis 01/29/2011  . Fibromyalgia 01/29/2011    Past Medical History:  Diagnosis Date  . Allergic rhinitis   . Arthritis   . Breast cancer ([place]) 2002   left  . Diabetes ([place])   . Fibromyalgia   . Glaucoma   . Hypercholesteremia   . Hyperlipidemia   . Personal history of chemotherapy 05/2000  left breast  . Personal history of radiation therapy 2002   Letf breast    Family History  Problem Relation Age of Onset  . Throat cancer Father   . Breast cancer Maternal Aunt   . CAD Mother   . Stroke Mother   . Breast cancer Daughter    Past Surgical History:  Procedure Laterality Date  . APPENDECTOMY   1989  . BREAST BIOPSY Left 04/26/2000  . BREAST LUMPECTOMY Left 05/14/2000   left  . CHOLECYSTECTOMY  1989  . KNEE ARTHROSCOPY Right   . SHOULDER SURGERY Left   . VAGINAL HYSTERECTOMY  1987   Social History   Social History Narrative  . Not on file   Immunization History  Administered Date(s) Administered  . Influenza, High Dose Seasonal PF 10/28/2017  . Influenza,inj,Quad PF,6+ Mos 12/17/2014  . Influenza-Unspecified 11/12/2015  . Pneumococcal Conjugate-13 07/20/2016, 10/28/2017     Objective: Vital Signs: BP 120/65 (BP Location: Right Arm, Patient Position: Sitting, Cuff Size: Normal)   Pulse 81   Resp 14   Ht 5' 2.5" (1.588 m)   Wt 126 lb 12.8 oz (57.5 kg)   BMI 22.82 kg/m    Physical Exam Vitals signs and nursing note reviewed.  Constitutional:      Appearance: She is well-developed.  HENT:     Head: Normocephalic and atraumatic.  Eyes:     Conjunctiva/sclera: Conjunctivae normal.  Neck:     Musculoskeletal: Normal range of motion.  Cardiovascular:     Rate and Rhythm: Normal rate and regular rhythm.     Heart sounds: Normal heart sounds.  Pulmonary:     Effort: Pulmonary effort is normal.     Breath sounds: Normal breath sounds.  Abdominal:     General: Bowel sounds are normal.     Palpations: Abdomen is soft.  Lymphadenopathy:     Cervical: No cervical adenopathy.  Skin:    General: Skin is warm and dry.     Capillary Refill: Capillary refill takes less than 2 seconds.  Neurological:     Mental Status: She is alert and oriented to person, place, and time.  Psychiatric:        Behavior: Behavior normal.      Musculoskeletal Exam: C-spine, thoracic spine, lumbar spine good range of motion.  No midline spinal tenderness.  No SI joint tenderness.  Shoulder joints, elbow joints, wrist joints, MCPs, PIPs, DIPs good range of motion no synovitis.  Hip joints have good range of motion no discomfort.  She has full range of motion of bilateral knee joints with  discomfort in the left knee.  Ankle joints have good range of motion with no tenderness or inflammation.  She has PIP and DIP synovial thickening consistent with osteoarthritis of bilateral feet.  CDAI Exam: CDAI Score: - Patient Global: -; Provider Global: - Swollen: -; Tender: - Joint Exam   No joint exam has been documented for this visit   There is currently no information documented on the homunculus. Go to the Rheumatology activity and complete the homunculus joint exam.  Investigation: No additional findings.  Imaging: No results found.  Recent Labs: Lab Results  Component Value Date   WBC 6.7 12/10/2017   HGB 11.9 (L) 12/10/2017   PLT 285 12/10/2017   NA 142 12/10/2017   K 4.6 12/10/2017   CL 104 12/10/2017   CO2 29 12/10/2017   GLUCOSE 107 (H) 12/10/2017   BUN 31 (H) 12/10/2017   CREATININE 0.83 12/10/2017  BILITOT 0.2 (L) 12/10/2017   ALKPHOS 61 12/10/2017   AST 17 12/10/2017   ALT 15 12/10/2017   PROT 7.5 12/10/2017   ALBUMIN 4.1 12/10/2017   CALCIUM 10.2 12/10/2017   GFRAA >60 12/10/2017    Speciality Comments: No specialty comments available.  Procedures:  Large Joint Inj: L knee on 09/29/2018 11:03 AM Indications: pain Details: 27 G 1.5 in needle, medial approach  Arthrogram: No  Medications: 40 mg triamcinolone acetonide 40 MG/ML; 1.5 mL lidocaine 1 % Aspirate: 0 mL Outcome: tolerated well, no immediate complications Procedure, treatment alternatives, risks and benefits explained, specific risks discussed. Consent was given by the patient. Immediately prior to procedure a time out was called to verify the correct patient, procedure, equipment, support staff and site/side marked as required. Patient was prepped and draped in the usual sterile fashion.     Allergies: Sulfa antibiotics   Assessment / Plan:     Visit Diagnoses: Fibromyalgia: She has generalized hyperalgesia and positive tender points on exam.  She has been having increased  generalized muscle aches and tenderness.  She has been under increased stress in preparation for moving.  She takes baclofen 10 mg BID for muscle spasms. She continues to take hydrocodone for pain relief.  She has chronic fatigue related to insomnia.  She takes trazodone 50 mg 1 tablet by mouth at bedtime for insomnia.  She does not need any refills at this time.  She will follow up in 6 months.   Chronic insomnia -She is taking  trazodone 50 mg 1 tablet by mouth at bedtime.  Other fatigue: She has chronic fatigue related to insomnia.   Trapezius muscle spasm - She takes baclofen 10 mg by mouth twice daily for muscle spasms.  She has trapezius muscle tension and tenderness.  She applies heat at times.   Primary osteoarthritis of both hands: She has PIP and DIP synovial thickening consistent with osteoarthritis of both hands.  Complete fist formation bilaterally.  Joint protection and muscle strengthening were discussed.   Greater trochanteric bursitis of both hips: She has intermittent symptoms of trochanteric bursitis bilaterally.    Chronic pain of both knees -She presents today with pain in both knee joints.  She reports that 2 to 3 days ago she was bending down to pick up something off the floor and felt a sharp pain in her left knee joint.  She has not had any mechanical symptoms.  She has noticed some warmth and inflammation in the left knee.  She has been favoring the left knee which has been causing some increased discomfort in the right knee joint.  No erythema or ecchymosis noted.  She has full range of motion of bilateral knee joints on exam.  She has discomfort with left knee joint extension. She has been taking hydrocodone for pain relief and applying voltaren gel topically.  X-rays of both knee joints were obtained today.  She requested a left knee joint cortisone injection today.  She tolerated the procedure well.  The procedure note was completed above.  She was advised to notify us if  her left knee joint pain persists or worsens and we will proceed with scheduling an MRI of the left knee joint.  Plan: XR KNEE 3 VIEW RIGHT, XR KNEE 3 VIEW LEFT.  The x-ray showed moderate osteoarthritis and mild chondromalacia patella.  History of diabetes mellitus: She was advised to monitor blood glucose closely following the cortisone injection today.  Osteoporosis, post-menopausal: I will place a future  order for DEXA today.  Her last bone density was in 2010.  She takes vitamin D 50,000 units by mouth once weekly.  Other medical conditions are listed as follows:   History of hyperlipidemia  History of depression     Orders: Orders Placed This Encounter  Procedures  . XR KNEE 3 VIEW RIGHT  . XR KNEE 3 VIEW LEFT   No orders of the defined types were placed in this encounter.   Face-to-face time spent with patient was 30 minutes. Greater than 50% of time was spent in counseling and coordination of care.  Follow-Up Instructions: Return in about 6 months (around 03/29/2019) for Fibromyalgia, Osteoarthritis.   Ofilia Neas, PA-C   I examined and evaluated the patient with Hazel Sams PA.  Patient continues to have a lot of pain and discomfort from fibromyalgia.  She is been having increased pain in her knee joints with difficulty walking.  She states her left knee joint has been painful.  No warmth swelling or effusion was noted.  The x-rays obtained today showed mild to moderate osteoarthritis and mild chondromalacia patella.  Per her request after informed consent was obtained left knee joint was injected with cortisone.  I have also advised her to ice her knees and use diclofenac gel.  If she has persistent problem she is supposed to notify us.  The plan of care was discussed as noted above.  Bo Merino, MD  Note - This record has been created using Editor, commissioning.  Chart creation errors have been sought, but may not always  have been located. Such creation errors do not  reflect on  the standard of medical care.

## 2018-09-29 ENCOUNTER — Other Ambulatory Visit: Payer: Self-pay

## 2018-09-29 ENCOUNTER — Ambulatory Visit (INDEPENDENT_AMBULATORY_CARE_PROVIDER_SITE_OTHER): Payer: Medicare Other | Admitting: Rheumatology

## 2018-09-29 ENCOUNTER — Ambulatory Visit: Payer: Self-pay

## 2018-09-29 ENCOUNTER — Encounter: Payer: Self-pay | Admitting: Rheumatology

## 2018-09-29 VITALS — BP 120/65 | HR 81 | Resp 14 | Ht 62.5 in | Wt 126.8 lb

## 2018-09-29 DIAGNOSIS — M62838 Other muscle spasm: Secondary | ICD-10-CM | POA: Diagnosis not present

## 2018-09-29 DIAGNOSIS — Z8659 Personal history of other mental and behavioral disorders: Secondary | ICD-10-CM

## 2018-09-29 DIAGNOSIS — M25561 Pain in right knee: Secondary | ICD-10-CM

## 2018-09-29 DIAGNOSIS — M19042 Primary osteoarthritis, left hand: Secondary | ICD-10-CM | POA: Diagnosis not present

## 2018-09-29 DIAGNOSIS — M19041 Primary osteoarthritis, right hand: Secondary | ICD-10-CM

## 2018-09-29 DIAGNOSIS — R5383 Other fatigue: Secondary | ICD-10-CM

## 2018-09-29 DIAGNOSIS — F5104 Psychophysiologic insomnia: Secondary | ICD-10-CM | POA: Diagnosis not present

## 2018-09-29 DIAGNOSIS — G8929 Other chronic pain: Secondary | ICD-10-CM

## 2018-09-29 DIAGNOSIS — M7061 Trochanteric bursitis, right hip: Secondary | ICD-10-CM

## 2018-09-29 DIAGNOSIS — M810 Age-related osteoporosis without current pathological fracture: Secondary | ICD-10-CM

## 2018-09-29 DIAGNOSIS — M25562 Pain in left knee: Secondary | ICD-10-CM

## 2018-09-29 DIAGNOSIS — M797 Fibromyalgia: Secondary | ICD-10-CM | POA: Diagnosis not present

## 2018-09-29 DIAGNOSIS — Z8639 Personal history of other endocrine, nutritional and metabolic disease: Secondary | ICD-10-CM

## 2018-09-29 DIAGNOSIS — M7062 Trochanteric bursitis, left hip: Secondary | ICD-10-CM

## 2018-09-29 MED ORDER — LIDOCAINE HCL 1 % IJ SOLN
1.5000 mL | INTRAMUSCULAR | Status: AC | PRN
  Administered 2018-09-29: 1.5 mL

## 2018-09-29 MED ORDER — TRIAMCINOLONE ACETONIDE 40 MG/ML IJ SUSP
40.0000 mg | INTRAMUSCULAR | Status: AC | PRN
  Administered 2018-09-29: 40 mg via INTRA_ARTICULAR

## 2018-09-29 NOTE — Addendum Note (Signed)
Addended by: Earnestine Mealing on: 09/29/2018 12:24 PM   Modules accepted: Orders

## 2018-10-21 ENCOUNTER — Ambulatory Visit: Payer: Medicare Other

## 2018-10-24 ENCOUNTER — Other Ambulatory Visit: Payer: Self-pay | Admitting: Rheumatology

## 2018-10-24 NOTE — Telephone Encounter (Signed)
Last Visit: 09/29/18 Next Visit: 03/30/19  Okay to refill Trazodone?

## 2018-11-26 ENCOUNTER — Other Ambulatory Visit: Payer: Self-pay | Admitting: Rheumatology

## 2018-11-26 DIAGNOSIS — M810 Age-related osteoporosis without current pathological fracture: Secondary | ICD-10-CM

## 2018-11-28 NOTE — Telephone Encounter (Signed)
Last Visit: 09/29/18 Next Visit: 03/30/19  Okay to refill per Dr. Estanislado Pandy

## 2018-12-05 ENCOUNTER — Other Ambulatory Visit: Payer: Self-pay

## 2018-12-05 ENCOUNTER — Ambulatory Visit
Admission: RE | Admit: 2018-12-05 | Discharge: 2018-12-05 | Disposition: A | Discharge status: Home / Self Care | Payer: Medicare Other | Source: Ambulatory Visit | Attending: Hematology & Oncology | Admitting: Hematology & Oncology

## 2018-12-05 DIAGNOSIS — Z1231 Encounter for screening mammogram for malignant neoplasm of breast: Secondary | ICD-10-CM

## 2018-12-16 ENCOUNTER — Inpatient Hospital Stay: Payer: Medicare Other | Attending: Family | Admitting: Family

## 2018-12-16 ENCOUNTER — Other Ambulatory Visit: Payer: Self-pay | Admitting: Family

## 2018-12-16 ENCOUNTER — Inpatient Hospital Stay: Payer: Medicare Other

## 2018-12-16 ENCOUNTER — Other Ambulatory Visit: Payer: Self-pay

## 2018-12-16 VITALS — BP 120/54 | HR 80 | Temp 98.0°F | Resp 18 | Wt 126.0 lb

## 2018-12-16 DIAGNOSIS — R200 Anesthesia of skin: Secondary | ICD-10-CM | POA: Insufficient documentation

## 2018-12-16 DIAGNOSIS — Z9221 Personal history of antineoplastic chemotherapy: Secondary | ICD-10-CM | POA: Diagnosis not present

## 2018-12-16 DIAGNOSIS — D509 Iron deficiency anemia, unspecified: Secondary | ICD-10-CM | POA: Diagnosis not present

## 2018-12-16 DIAGNOSIS — E559 Vitamin D deficiency, unspecified: Secondary | ICD-10-CM

## 2018-12-16 DIAGNOSIS — C50919 Malignant neoplasm of unspecified site of unspecified female breast: Secondary | ICD-10-CM

## 2018-12-16 DIAGNOSIS — Z853 Personal history of malignant neoplasm of breast: Secondary | ICD-10-CM | POA: Diagnosis present

## 2018-12-16 LAB — CBC WITH DIFFERENTIAL (CANCER CENTER ONLY)
Abs Immature Granulocytes: 0.01 10*3/uL (ref 0.00–0.07)
Basophils Absolute: 0.1 10*3/uL (ref 0.0–0.1)
Basophils Relative: 1 %
Eosinophils Absolute: 0.3 10*3/uL (ref 0.0–0.5)
Eosinophils Relative: 4 %
HCT: 32 % — ABNORMAL LOW (ref 36.0–46.0)
Hemoglobin: 10.1 g/dL — ABNORMAL LOW (ref 12.0–15.0)
Immature Granulocytes: 0 %
Lymphocytes Relative: 42 %
Lymphs Abs: 2.8 10*3/uL (ref 0.7–4.0)
MCH: 27.5 pg (ref 26.0–34.0)
MCHC: 31.6 g/dL (ref 30.0–36.0)
MCV: 87.2 fL (ref 80.0–100.0)
Monocytes Absolute: 0.4 10*3/uL (ref 0.1–1.0)
Monocytes Relative: 6 %
Neutro Abs: 3.2 10*3/uL (ref 1.7–7.7)
Neutrophils Relative %: 47 %
Platelet Count: 283 10*3/uL (ref 150–400)
RBC: 3.67 MIL/uL — ABNORMAL LOW (ref 3.87–5.11)
RDW: 14.6 % (ref 11.5–15.5)
WBC Count: 6.7 10*3/uL (ref 4.0–10.5)
nRBC: 0 % (ref 0.0–0.2)

## 2018-12-16 LAB — CMP (CANCER CENTER ONLY)
ALT: 13 U/L (ref 0–44)
AST: 19 U/L (ref 15–41)
Albumin: 4.5 g/dL (ref 3.5–5.0)
Alkaline Phosphatase: 48 U/L (ref 38–126)
Anion gap: 6 (ref 5–15)
BUN: 20 mg/dL (ref 8–23)
CO2: 30 mmol/L (ref 22–32)
Calcium: 9.5 mg/dL (ref 8.9–10.3)
Chloride: 106 mmol/L (ref 98–111)
Creatinine: 0.75 mg/dL (ref 0.44–1.00)
GFR, Est AFR Am: >60 mL/min (ref >60)
GFR, Estimated: >60 mL/min (ref >60)
Glucose, Bld: 108 mg/dL — ABNORMAL HIGH (ref 70–99)
Potassium: 3.8 mmol/L (ref 3.5–5.1)
Sodium: 142 mmol/L (ref 135–145)
Total Bilirubin: 0.2 mg/dL — ABNORMAL LOW (ref 0.3–1.2)
Total Protein: 7 g/dL (ref 6.5–8.1)

## 2018-12-16 LAB — VITAMIN D 25 HYDROXY (VIT D DEFICIENCY, FRACTURES): Vit D, 25-Hydroxy: 107.02 ng/mL — ABNORMAL HIGH (ref 30–100)

## 2018-12-16 NOTE — Progress Notes (Signed)
Hematology and Oncology Follow Up Visit  Linda [name] [id number] Feb 26, 1950 68 y.o. 12/16/2018   Principle Diagnosis:  Stage IIA (T2 N0 M0) ductal carcinoma of the left breast triple-negative  Current Therapy:   Observation   Interim History:  Linda [name] is here today for her annual follow-up. She continues to do well and has no complaints at this time.  Mammogram last week was clear! :) Breast exam today was also negative.  She denies fatigue. Hgb is down a bit at 10.1, MCV 87.  She has not had any issues with bleeding. No bruising or petechiae.  She has had no issue with infections. No fever, chills, n/v, cough, rash, dizziness, SOB, chest pain, palpitations, abdominal pain or changes in bowel or bladder habits.  No swelling or tenderness in her extremities.  She has chronic neck issues and associated numbness and tingling in her fingertips.  No falls or syncopal episodes.  She has maintained a good appetite and is staying well hydrated. Her weight is stable.   ECOG Performance Status: 1 - Symptomatic but completely ambulatory  Medications:  Allergies as of 12/16/2018      Reactions   Sulfa Antibiotics Rash      Medication List       Accurate as of December 16, 2018  3:42 PM. If you have any questions, ask your nurse or doctor.        aspirin EC 81 MG tablet Take 1 tablet (81 mg total) by mouth daily.   baclofen 10 MG tablet Commonly known as: LIORESAL TAKE 1 TABLET BY MOUTH DAILY AT 8AM AND 3PM AS NEEDED FOR MUSCLE SPASMS   buPROPion 300 MG 24 hr tablet Commonly known as: WELLBUTRIN XL Take 300 mg by mouth daily.   [employer] Allergy Relief D 60-120 MG 12 hr tablet Generic drug: fexofenadine-pseudoephedrine Take 1 tablet by mouth as needed.   diclofenac sodium 1 % Gel Commonly known as: VOLTAREN APPLY 2 GRAMS TO 4 GRAMS TOPICALLY TO AFFECTED AREA UP TO 4 TIMES DAILY AS NEEDED   HYDROcodone-acetaminophen 5-325 MG tablet Commonly known as: NORCO/VICODIN  Take 1 tablet by mouth every 8 (eight) hours as needed. for pain   ibuprofen 600 MG tablet Commonly known as: ADVIL TAKE 1 TABLET BY MOUTH THREE TIMES A DAY WITH FOOD   Janumet XR [phone] MG Tb24 Generic drug: SitaGLIPtin-MetFORMIN HCl Take 1 tablet by mouth daily.   Lumigan 0.01 % Soln Generic drug: bimatoprost 1 DROP INTO BOTH EYES IN THE EVENING   traZODone 50 MG tablet Commonly known as: DESYREL TAKE 1 TABLET BY MOUTH EVERYDAY AT BEDTIME   Vitamin D (Ergocalciferol) 1.25 MG (50000 UT) Caps capsule Commonly known as: DRISDOL Take 1 capsule (50,000 Units total) by mouth once a week.       Allergies:  Allergies  Allergen Reactions  . Sulfa Antibiotics Rash    Past Medical History, Surgical history, Social history, and Family History were reviewed and updated.  Review of Systems: All other 10 point review of systems is negative.   Physical Exam:  vitals were not taken for this visit.   Wt Readings from Last 3 Encounters:  09/29/18 126 lb 12.8 oz (57.5 kg)  12/10/17 128 lb (58.1 kg)  10/28/17 125 lb 3.2 oz (56.8 kg)    Ocular: Sclerae unicteric, pupils equal, round and reactive to light Ear-nose-throat: Oropharynx clear, dentition fair Lymphatic: No cervical or supraclavicular adenopathy Lungs no rales or rhonchi, good excursion bilaterally Heart regular rate and rhythm, no  murmur appreciated Abd soft, nontender, positive bowel sounds, no liver or spleen tip palpated on exam, no fluid wave  MSK no focal spinal tenderness, no joint edema Neuro: non-focal, well-oriented, appropriate affect Breasts: Deferred   Lab Results  Component Value Date   WBC 6.7 12/10/2017   HGB 11.9 (L) 12/10/2017   HCT 37.9 12/10/2017   MCV 88.6 12/10/2017   PLT 285 12/10/2017   Lab Results  Component Value Date   FERRITIN 25 04/23/2012   IRON 64 04/23/2012   TIBC 372 04/23/2012   UIBC 308 04/23/2012   IRONPCTSAT 17 (L) 04/23/2012   Lab Results  Component Value Date    RETICCTPCT 1.3 04/23/2012   RBC 4.28 12/10/2017   No results found for: KPAFRELGTCHN, LAMBDASER, KAPLAMBRATIO No results found for: IGGSERUM, IGA, IGMSERUM No results found for: Odetta Pink, SPEI   Chemistry      Component Value Date/Time   NA 142 12/10/2017 1321   NA 139 12/11/2016 1252   NA 138 12/13/2015 1352   K 4.6 12/10/2017 1321   K 3.9 12/11/2016 1252   K 4.1 12/13/2015 1352   CL 104 12/10/2017 1321   CL 104 12/11/2016 1252   CO2 29 12/10/2017 1321   CO2 28 12/11/2016 1252   CO2 23 12/13/2015 1352   BUN 31 (H) 12/10/2017 1321   BUN 14 12/11/2016 1252   BUN 17.2 12/13/2015 1352   CREATININE 0.83 12/10/2017 1321   CREATININE 0.72 12/11/2016 1252   CREATININE 0.8 12/13/2015 1352      Component Value Date/Time   CALCIUM 10.2 12/10/2017 1321   CALCIUM 9.6 12/11/2016 1252   CALCIUM 9.7 12/13/2015 1352   ALKPHOS 61 12/10/2017 1321   ALKPHOS 80 12/11/2016 1252   ALKPHOS 79 12/13/2015 1352   AST 17 12/10/2017 1321   AST 14 12/13/2015 1352   ALT 15 12/10/2017 1321   ALT 13 12/13/2015 1352   BILITOT 0.2 (L) 12/10/2017 1321   BILITOT <0.22 12/13/2015 1352       Impression and Plan: Linda [name] is a very pleasant 68 yo African American female with history of stage IIa (T2N0M0) ductal carcinoma of the left breast, truple negative. She was diagnosed and treated over 15 years ago with lumpectomy and chemotherapy (Adriamycin/Cytoxan).  She is doing quite well. Mammogram was negative as was today's exam.  We will see what her iron studies and vitamin D look like and replace if needed.  We will plan to see her back in another year for follow-up.  She will contact our office with any questions or concerns. We can certainly see her sooner if needed.   Laverna Peace, NP 11/20/20203:42 PM

## 2018-12-19 ENCOUNTER — Other Ambulatory Visit: Payer: Self-pay | Admitting: Family

## 2018-12-19 DIAGNOSIS — D509 Iron deficiency anemia, unspecified: Secondary | ICD-10-CM | POA: Insufficient documentation

## 2018-12-19 LAB — FERRITIN: Ferritin: 18 ng/mL (ref 11–307)

## 2018-12-19 LAB — LACTATE DEHYDROGENASE: LDH: 170 U/L (ref 98–192)

## 2018-12-19 LAB — IRON AND TIBC
Iron: 29 ug/dL — ABNORMAL LOW (ref 41–142)
Saturation Ratios: 9 % — ABNORMAL LOW (ref 21–57)
TIBC: 309 ug/dL (ref 236–444)
UIBC: 280 ug/dL (ref 120–384)

## 2018-12-30 ENCOUNTER — Inpatient Hospital Stay: Payer: Medicare Other | Attending: Hematology & Oncology

## 2018-12-30 ENCOUNTER — Other Ambulatory Visit: Payer: Self-pay

## 2018-12-30 VITALS — BP 114/51 | HR 68 | Temp 97.1°F | Resp 19 | Ht 62.0 in | Wt 127.2 lb

## 2018-12-30 DIAGNOSIS — D509 Iron deficiency anemia, unspecified: Secondary | ICD-10-CM | POA: Diagnosis present

## 2018-12-30 MED ORDER — SODIUM CHLORIDE 0.9 % IV SOLN
200.0000 mg | Freq: Once | INTRAVENOUS | Status: AC
  Administered 2018-12-30: 200 mg via INTRAVENOUS
  Filled 2018-12-30: qty 10

## 2018-12-30 MED ORDER — SODIUM CHLORIDE 0.9 % IV SOLN
Freq: Once | INTRAVENOUS | Status: AC
  Administered 2018-12-30: 12:00:00 via INTRAVENOUS
  Filled 2018-12-30: qty 250

## 2018-12-30 NOTE — Patient Instructions (Signed)

## 2019-01-06 ENCOUNTER — Other Ambulatory Visit: Payer: Self-pay

## 2019-01-06 ENCOUNTER — Ambulatory Visit: Payer: Medicare Other

## 2019-01-06 ENCOUNTER — Inpatient Hospital Stay: Payer: Medicare Other

## 2019-01-06 VITALS — BP 119/42 | HR 72 | Temp 97.5°F | Resp 17

## 2019-01-06 DIAGNOSIS — D509 Iron deficiency anemia, unspecified: Secondary | ICD-10-CM

## 2019-01-06 MED ORDER — SODIUM CHLORIDE 0.9 % IV SOLN
Freq: Once | INTRAVENOUS | Status: AC
  Administered 2019-01-06: 10:00:00 via INTRAVENOUS
  Filled 2019-01-06: qty 250

## 2019-01-06 MED ORDER — SODIUM CHLORIDE 0.9 % IV SOLN
200.0000 mg | Freq: Once | INTRAVENOUS | Status: AC
  Administered 2019-01-06: 200 mg via INTRAVENOUS
  Filled 2019-01-06: qty 10

## 2019-01-06 NOTE — Patient Instructions (Signed)

## 2019-01-13 ENCOUNTER — Other Ambulatory Visit: Payer: Self-pay

## 2019-01-13 ENCOUNTER — Inpatient Hospital Stay: Payer: Medicare Other

## 2019-01-13 VITALS — BP 119/62 | HR 72 | Temp 97.8°F | Resp 17

## 2019-01-13 DIAGNOSIS — D509 Iron deficiency anemia, unspecified: Secondary | ICD-10-CM | POA: Diagnosis not present

## 2019-01-13 MED ORDER — SODIUM CHLORIDE 0.9 % IV SOLN
200.0000 mg | Freq: Once | INTRAVENOUS | Status: AC
  Administered 2019-01-13: 200 mg via INTRAVENOUS
  Filled 2019-01-13: qty 10

## 2019-01-13 MED ORDER — SODIUM CHLORIDE 0.9 % IV SOLN
Freq: Once | INTRAVENOUS | Status: AC
  Filled 2019-01-13: qty 250

## 2019-01-13 NOTE — Patient Instructions (Signed)

## 2019-01-13 NOTE — Progress Notes (Signed)
Pt declined staying for 30 minute post infusion observation period.

## 2019-01-18 ENCOUNTER — Other Ambulatory Visit: Payer: Self-pay | Admitting: Rheumatology

## 2019-01-18 ENCOUNTER — Ambulatory Visit: Payer: Medicare Other

## 2019-01-18 NOTE — Telephone Encounter (Signed)
Last Visit: 09/29/2018 Next Visit: 03/30/2019  Okay to refill per Dr. Estanislado Pandy.

## 2019-01-25 ENCOUNTER — Inpatient Hospital Stay: Payer: Medicare Other

## 2019-01-25 ENCOUNTER — Other Ambulatory Visit: Payer: Self-pay

## 2019-01-25 VITALS — BP 120/56 | HR 70 | Resp 18

## 2019-01-25 DIAGNOSIS — D509 Iron deficiency anemia, unspecified: Secondary | ICD-10-CM

## 2019-01-25 MED ORDER — SODIUM CHLORIDE 0.9 % IV SOLN
Freq: Once | INTRAVENOUS | Status: AC
  Filled 2019-01-25: qty 250

## 2019-01-25 MED ORDER — SODIUM CHLORIDE 0.9 % IV SOLN
200.0000 mg | Freq: Once | INTRAVENOUS | Status: AC
  Administered 2019-01-25: 14:00:00 200 mg via INTRAVENOUS
  Filled 2019-01-25: qty 200

## 2019-01-25 NOTE — Patient Instructions (Signed)

## 2019-02-01 ENCOUNTER — Inpatient Hospital Stay: Payer: Medicare PPO | Attending: Hematology & Oncology

## 2019-02-01 ENCOUNTER — Other Ambulatory Visit: Payer: Self-pay

## 2019-02-01 VITALS — BP 119/50 | HR 91 | Temp 97.1°F | Resp 16

## 2019-02-01 DIAGNOSIS — D509 Iron deficiency anemia, unspecified: Secondary | ICD-10-CM | POA: Diagnosis not present

## 2019-02-01 MED ORDER — SODIUM CHLORIDE 0.9 % IV SOLN
200.0000 mg | Freq: Once | INTRAVENOUS | Status: AC
  Administered 2019-02-01: 200 mg via INTRAVENOUS
  Filled 2019-02-01: qty 200

## 2019-02-01 MED ORDER — SODIUM CHLORIDE 0.9 % IV SOLN
INTRAVENOUS | Status: DC
  Filled 2019-02-01 (×2): qty 250

## 2019-02-01 NOTE — Patient Instructions (Signed)

## 2019-02-14 ENCOUNTER — Other Ambulatory Visit: Payer: Self-pay | Admitting: *Deleted

## 2019-02-14 DIAGNOSIS — E785 Hyperlipidemia, unspecified: Secondary | ICD-10-CM | POA: Diagnosis not present

## 2019-02-14 DIAGNOSIS — E1169 Type 2 diabetes mellitus with other specified complication: Secondary | ICD-10-CM | POA: Diagnosis not present

## 2019-02-14 DIAGNOSIS — R109 Unspecified abdominal pain: Secondary | ICD-10-CM | POA: Diagnosis not present

## 2019-02-14 DIAGNOSIS — I10 Essential (primary) hypertension: Secondary | ICD-10-CM | POA: Diagnosis not present

## 2019-02-14 DIAGNOSIS — J019 Acute sinusitis, unspecified: Secondary | ICD-10-CM | POA: Diagnosis not present

## 2019-02-14 DIAGNOSIS — M797 Fibromyalgia: Secondary | ICD-10-CM | POA: Diagnosis not present

## 2019-02-14 MED ORDER — VITAMIN D (ERGOCALCIFEROL) 1.25 MG (50000 UNIT) PO CAPS: 50000.0000 [IU] | ORAL_CAPSULE | ORAL | 1 refills | Status: DC

## 2019-02-17 DIAGNOSIS — I10 Essential (primary) hypertension: Secondary | ICD-10-CM | POA: Diagnosis not present

## 2019-02-17 DIAGNOSIS — J320 Chronic maxillary sinusitis: Secondary | ICD-10-CM | POA: Diagnosis not present

## 2019-02-17 DIAGNOSIS — E785 Hyperlipidemia, unspecified: Secondary | ICD-10-CM | POA: Diagnosis not present

## 2019-03-06 ENCOUNTER — Other Ambulatory Visit: Payer: Self-pay | Admitting: Rheumatology

## 2019-03-06 DIAGNOSIS — M810 Age-related osteoporosis without current pathological fracture: Secondary | ICD-10-CM

## 2019-03-15 DIAGNOSIS — M4802 Spinal stenosis, cervical region: Secondary | ICD-10-CM | POA: Diagnosis not present

## 2019-03-15 DIAGNOSIS — M545 Low back pain: Secondary | ICD-10-CM | POA: Diagnosis not present

## 2019-03-15 DIAGNOSIS — M5412 Radiculopathy, cervical region: Secondary | ICD-10-CM | POA: Diagnosis not present

## 2019-03-17 DIAGNOSIS — J329 Chronic sinusitis, unspecified: Secondary | ICD-10-CM | POA: Diagnosis not present

## 2019-03-17 DIAGNOSIS — E785 Hyperlipidemia, unspecified: Secondary | ICD-10-CM | POA: Diagnosis not present

## 2019-03-17 DIAGNOSIS — F064 Anxiety disorder due to known physiological condition: Secondary | ICD-10-CM | POA: Diagnosis not present

## 2019-03-17 DIAGNOSIS — M797 Fibromyalgia: Secondary | ICD-10-CM | POA: Diagnosis not present

## 2019-03-24 NOTE — Progress Notes (Deleted)
Office Visit Note  Patient: Linda [name]             Date of Birth: 10-24-1950           MRN: [id number]             PCP: Lucianne Lei, MD Referring: Lucianne Lei, MD Visit Date: 03/30/2019 Occupation: @GUAROCC @  Subjective:  No chief complaint on file.   History of Present Illness: Linda [name] is a 69 y.o. female ***   Activities of Daily Living:  Patient reports morning stiffness for *** {minute/hour:19697}.   Patient {ACTIONS;DENIES/REPORTS:21021675::"Denies"} nocturnal pain.  Difficulty dressing/grooming: {ACTIONS;DENIES/REPORTS:21021675::"Denies"} Difficulty climbing stairs: {ACTIONS;DENIES/REPORTS:21021675::"Denies"} Difficulty getting out of chair: {ACTIONS;DENIES/REPORTS:21021675::"Denies"} Difficulty using hands for taps, buttons, cutlery, and/or writing: {ACTIONS;DENIES/REPORTS:21021675::"Denies"}  No Rheumatology ROS completed.   PMFS History:  Patient Active Problem List   Diagnosis Date Noted  . IDA (iron deficiency anemia) 12/19/2018  . Fatigue 12/10/2015  . Primary osteoarthritis of both hands 12/10/2015  . Low back pain without sciatica 12/10/2015  . Type 2 diabetes mellitus  12/10/2015  . Depression 12/10/2015  . Chronic pansinusitis 09/26/2015  . Genetic testing 01/11/2014  . Hives 11/11/2012  . Chest pain 04/23/2012  . Hyperlipidemia   . Chronic insomnia 01/29/2011  . Perennial allergic rhinitis 01/29/2011  . Fibromyalgia 01/29/2011    Past Medical History:  Diagnosis Date  . Allergic rhinitis   . Arthritis   . Breast cancer ([place]) 2002   left  . Diabetes ([place])   . Fibromyalgia   . Glaucoma   . Hypercholesteremia   . Hyperlipidemia   . Personal history of chemotherapy 05/2000   left breast  . Personal history of radiation therapy 2002   Letf breast    Family History  Problem Relation Age of Onset  . Throat cancer Father   . Breast cancer Maternal Aunt   . CAD Mother   . Stroke Mother   . Breast cancer Daughter     Past Surgical History:  Procedure Laterality Date  . APPENDECTOMY  1989  . BREAST BIOPSY Left 04/26/2000  . BREAST LUMPECTOMY Left 05/14/2000   left  . CHOLECYSTECTOMY  1989  . KNEE ARTHROSCOPY Right   . SHOULDER SURGERY Left   . VAGINAL HYSTERECTOMY  1987   Social History   Social History Narrative  . Not on file   Immunization History  Administered Date(s) Administered  . Influenza, High Dose Seasonal PF 10/28/2017  . Influenza,inj,Quad PF,6+ Mos 12/17/2014  . Influenza-Unspecified 11/12/2015  . Pneumococcal Conjugate-13 07/20/2016, 10/28/2017     Objective: Vital Signs: There were no vitals taken for this visit.   Physical Exam   Musculoskeletal Exam: ***  CDAI Exam: CDAI Score: -- Patient Global: --; Provider Global: -- Swollen: --; Tender: -- Joint Exam 03/30/2019   No joint exam has been documented for this visit   There is currently no information documented on the homunculus. Go to the Rheumatology activity and complete the homunculus joint exam.  Investigation: No additional findings.  Imaging: No results found.  Recent Labs: Lab Results  Component Value Date   WBC 6.7 12/16/2018   HGB 10.1 (L) 12/16/2018   PLT 283 12/16/2018   NA 142 12/16/2018   K 3.8 12/16/2018   CL 106 12/16/2018   CO2 30 12/16/2018   GLUCOSE 108 (H) 12/16/2018   BUN 20 12/16/2018   CREATININE 0.75 12/16/2018   BILITOT 0.2 (L) 12/16/2018   ALKPHOS 48 12/16/2018   AST 19 12/16/2018  ALT 13 12/16/2018   PROT 7.0 12/16/2018   ALBUMIN 4.5 12/16/2018   CALCIUM 9.5 12/16/2018   GFRAA >60 12/16/2018    Speciality Comments: No specialty comments available.  Procedures:  No procedures performed Allergies: Sulfa antibiotics   Assessment / Plan:     Visit Diagnoses: No diagnosis found.  Orders: No orders of the defined types were placed in this encounter.  No orders of the defined types were placed in this encounter.   Face-to-face time spent with patient was  *** minutes. Greater than 50% of time was spent in counseling and coordination of care.  Follow-Up Instructions: No follow-ups on file.   Earnestine Mealing, CMA  Note - This record has been created using Editor, commissioning.  Chart creation errors have been sought, but may not always  have been located. Such creation errors do not reflect on  the standard of medical care.

## 2019-03-30 ENCOUNTER — Ambulatory Visit: Payer: Medicare Other | Admitting: Rheumatology

## 2019-03-30 NOTE — Progress Notes (Deleted)
Office Visit Note  Patient: Linda [name]             Date of Birth: Oct 11, 1950           MRN: [id number]             PCP: Lucianne Lei, MD Referring: Lucianne Lei, MD Visit Date: 03/31/2019 Occupation: @GUAROCC @  Subjective:  No chief complaint on file.   History of Present Illness: Linda [name] is a 69 y.o. female ***   Activities of Daily Living:  Patient reports morning stiffness for *** {minute/hour:19697}.   Patient {ACTIONS;DENIES/REPORTS:21021675::"Denies"} nocturnal pain.  Difficulty dressing/grooming: {ACTIONS;DENIES/REPORTS:21021675::"Denies"} Difficulty climbing stairs: {ACTIONS;DENIES/REPORTS:21021675::"Denies"} Difficulty getting out of chair: {ACTIONS;DENIES/REPORTS:21021675::"Denies"} Difficulty using hands for taps, buttons, cutlery, and/or writing: {ACTIONS;DENIES/REPORTS:21021675::"Denies"}  No Rheumatology ROS completed.   PMFS History:  Patient Active Problem List   Diagnosis Date Noted  . IDA (iron deficiency anemia) 12/19/2018  . Fatigue 12/10/2015  . Primary osteoarthritis of both hands 12/10/2015  . Low back pain without sciatica 12/10/2015  . Type 2 diabetes mellitus  12/10/2015  . Depression 12/10/2015  . Chronic pansinusitis 09/26/2015  . Genetic testing 01/11/2014  . Hives 11/11/2012  . Chest pain 04/23/2012  . Hyperlipidemia   . Chronic insomnia 01/29/2011  . Perennial allergic rhinitis 01/29/2011  . Fibromyalgia 01/29/2011    Past Medical History:  Diagnosis Date  . Allergic rhinitis   . Arthritis   . Breast cancer ([place]) 2002   left  . Diabetes ([place])   . Fibromyalgia   . Glaucoma   . Hypercholesteremia   . Hyperlipidemia   . Personal history of chemotherapy 05/2000   left breast  . Personal history of radiation therapy 2002   Letf breast    Family History  Problem Relation Age of Onset  . Throat cancer Father   . Breast cancer Maternal Aunt   . CAD Mother   . Stroke Mother   . Breast cancer Daughter     Past Surgical History:  Procedure Laterality Date  . APPENDECTOMY  1989  . BREAST BIOPSY Left 04/26/2000  . BREAST LUMPECTOMY Left 05/14/2000   left  . CHOLECYSTECTOMY  1989  . KNEE ARTHROSCOPY Right   . SHOULDER SURGERY Left   . VAGINAL HYSTERECTOMY  1987   Social History   Social History Narrative  . Not on file   Immunization History  Administered Date(s) Administered  . Influenza, High Dose Seasonal PF 10/28/2017  . Influenza,inj,Quad PF,6+ Mos 12/17/2014  . Influenza-Unspecified 11/12/2015  . Pneumococcal Conjugate-13 07/20/2016, 10/28/2017     Objective: Vital Signs: There were no vitals taken for this visit.   Physical Exam   Musculoskeletal Exam: ***  CDAI Exam: CDAI Score: -- Patient Global: --; Provider Global: -- Swollen: --; Tender: -- Joint Exam 03/31/2019   No joint exam has been documented for this visit   There is currently no information documented on the homunculus. Go to the Rheumatology activity and complete the homunculus joint exam.  Investigation: No additional findings.  Imaging: No results found.  Recent Labs: Lab Results  Component Value Date   WBC 6.7 12/16/2018   HGB 10.1 (L) 12/16/2018   PLT 283 12/16/2018   NA 142 12/16/2018   K 3.8 12/16/2018   CL 106 12/16/2018   CO2 30 12/16/2018   GLUCOSE 108 (H) 12/16/2018   BUN 20 12/16/2018   CREATININE 0.75 12/16/2018   BILITOT 0.2 (L) 12/16/2018   ALKPHOS 48 12/16/2018   AST 19 12/16/2018  ALT 13 12/16/2018   PROT 7.0 12/16/2018   ALBUMIN 4.5 12/16/2018   CALCIUM 9.5 12/16/2018   GFRAA >60 12/16/2018    Speciality Comments: No specialty comments available.  Procedures:  No procedures performed Allergies: Sulfa antibiotics   Assessment / Plan:     Visit Diagnoses: No diagnosis found.  Orders: No orders of the defined types were placed in this encounter.  No orders of the defined types were placed in this encounter.   Face-to-face time spent with patient was  *** minutes. Greater than 50% of time was spent in counseling and coordination of care.  Follow-Up Instructions: No follow-ups on file.   Earnestine Mealing, CMA  Note - This record has been created using Editor, commissioning.  Chart creation errors have been sought, but may not always  have been located. Such creation errors do not reflect on  the standard of medical care.

## 2019-03-31 ENCOUNTER — Ambulatory Visit: Payer: Medicare PPO | Admitting: Physician Assistant

## 2019-04-10 NOTE — Progress Notes (Signed)
Office Visit Note  Patient: Linda [name]             Date of Birth: 1950-08-15           MRN: [id number]             PCP: Lucianne Lei, MD Referring: Lucianne Lei, MD Visit Date: 04/18/2019 Occupation: @GUAROCC @  Subjective:  Left knee joint pain   History of Present Illness: Linda [name] is a 69 y.o. female with history of fibromyalgia and osteoarthritis.  She presents today with right trochanteric bursitis and left knee joint pain.  She requested cortisone injections today, but she has an upcoming C-spine fusion on 05/04/19 performed by Dr. Christella Noa. She uses Voltaren gel topically as needed for pain relief. She continues have generalized muscle aches muscle tenderness due to fibromyalgia.  She takes baclofen 10 mg 1 tablet twice daily as needed for muscle spasms.  She has chronic fatigue secondary to insomnia.  She takes trazodone 50 mg 1 tablet by mouth at bedtime for insomnia.   Activities of Daily Living:  Patient reports morning stiffness for 10 minutes.   Patient Reports nocturnal pain.  Difficulty dressing/grooming: Denies Difficulty climbing stairs: Reports Difficulty getting out of chair: Reports Difficulty using hands for taps, buttons, cutlery, and/or writing: Denies  Review of Systems  Constitutional: Positive for fatigue.  HENT: Positive for mouth dryness. Negative for mouth sores and nose dryness.   Eyes: Positive for dryness. Negative for pain and visual disturbance.  Respiratory: Negative for cough, hemoptysis and difficulty breathing.   Cardiovascular: Negative for chest pain, palpitations and hypertension.  Gastrointestinal: Positive for constipation. Negative for blood in stool and diarrhea.  Endocrine: Negative for increased urination.  Genitourinary: Negative for difficulty urinating and painful urination.  Musculoskeletal: Positive for arthralgias, joint pain, joint swelling, morning stiffness and muscle tenderness. Negative for myalgias, muscle  weakness and myalgias.  Skin: Positive for hair loss. Negative for color change, pallor, rash, nodules/bumps, skin tightness, ulcers and sensitivity to sunlight.  Neurological: Negative for dizziness and headaches.  Hematological: Negative for swollen glands.  Psychiatric/Behavioral: Positive for sleep disturbance. Negative for depressed mood. The patient is not nervous/anxious.     PMFS History:  Patient Active Problem List   Diagnosis Date Noted  . IDA (iron deficiency anemia) 12/19/2018  . Fatigue 12/10/2015  . Primary osteoarthritis of both hands 12/10/2015  . Low back pain without sciatica 12/10/2015  . Type 2 diabetes mellitus  12/10/2015  . Depression 12/10/2015  . Chronic pansinusitis 09/26/2015  . Genetic testing 01/11/2014  . Hives 11/11/2012  . Chest pain 04/23/2012  . Hyperlipidemia   . Chronic insomnia 01/29/2011  . Perennial allergic rhinitis 01/29/2011  . Fibromyalgia 01/29/2011    Past Medical History:  Diagnosis Date  . Allergic rhinitis   . Arthritis   . Breast cancer ([place]) 2002   left  . Diabetes ([place])   . Fibromyalgia   . Glaucoma   . Hypercholesteremia   . Hyperlipidemia   . Personal history of chemotherapy 05/2000   left breast  . Personal history of radiation therapy 2002   Letf breast    Family History  Problem Relation Age of Onset  . Throat cancer Father   . Breast cancer Maternal Aunt   . CAD Mother   . Stroke Mother   . Breast cancer Daughter    Past Surgical History:  Procedure Laterality Date  . APPENDECTOMY  1989  . BREAST BIOPSY Left 04/26/2000  . BREAST  LUMPECTOMY Left 05/14/2000   left  . CHOLECYSTECTOMY  1989  . KNEE ARTHROSCOPY Right   . SHOULDER SURGERY Left   . VAGINAL HYSTERECTOMY  1987   Social History   Social History Narrative  . Not on file   Immunization History  Administered Date(s) Administered  . Influenza, High Dose Seasonal PF 10/28/2017  . Influenza,inj,Quad PF,6+ Mos 12/17/2014  .  Influenza-Unspecified 11/12/2015  . Pneumococcal Conjugate-13 07/20/2016, 10/28/2017     Objective: Vital Signs: BP 113/61 (BP Location: Right Arm, Patient Position: Sitting, Cuff Size: Normal)   Pulse 84   Resp 16   Ht 5\' 2"  (1.575 m)   Wt 128 lb 3.2 oz (58.2 kg)   BMI 23.45 kg/m    Physical Exam Vitals and nursing note reviewed.  Constitutional:      Appearance: She is well-developed.  HENT:     Head: Normocephalic and atraumatic.  Eyes:     Conjunctiva/sclera: Conjunctivae normal.  Pulmonary:     Effort: Pulmonary effort is normal.  Abdominal:     General: Bowel sounds are normal.     Palpations: Abdomen is soft.  Musculoskeletal:     Cervical back: Normal range of motion.  Lymphadenopathy:     Cervical: No cervical adenopathy.  Skin:    General: Skin is warm and dry.     Capillary Refill: Capillary refill takes less than 2 seconds.  Neurological:     Mental Status: She is alert and oriented to person, place, and time.  Psychiatric:        Behavior: Behavior normal.      Musculoskeletal Exam: C-spine limited range of motion.  Thoracic and lumbar spine good range of motion.  She has midline spinal tenderness in the lumbar region.  Shoulder joints, elbow joints and wrist joints, MCPs and PIPs and DIPs good range of motion with no synovitis.  She has complete fist formation bilaterally.  Hip joints have good range of motion bilaterally.  She has tenderness over the right trochanteric bursa.  Knee joints have good range of motion with no warmth or effusion.  She has bilateral knee crepitus.  Ankle joints have good range of motion with no tenderness or inflammation.  CDAI Exam: CDAI Score: -- Patient Global: --; Provider Global: -- Swollen: --; Tender: -- Joint Exam 04/18/2019   No joint exam has been documented for this visit   There is currently no information documented on the homunculus. Go to the Rheumatology activity and complete the homunculus joint  exam.  Investigation: No additional findings.  Imaging: No results found.  Recent Labs: Lab Results  Component Value Date   WBC 6.7 12/16/2018   HGB 10.1 (L) 12/16/2018   PLT 283 12/16/2018   NA 142 12/16/2018   K 3.8 12/16/2018   CL 106 12/16/2018   CO2 30 12/16/2018   GLUCOSE 108 (H) 12/16/2018   BUN 20 12/16/2018   CREATININE 0.75 12/16/2018   BILITOT 0.2 (L) 12/16/2018   ALKPHOS 48 12/16/2018   AST 19 12/16/2018   ALT 13 12/16/2018   PROT 7.0 12/16/2018   ALBUMIN 4.5 12/16/2018   CALCIUM 9.5 12/16/2018   GFRAA >60 12/16/2018    Speciality Comments: No specialty comments available.  Procedures:  No procedures performed Allergies: Sulfa antibiotics   Assessment / Plan:     Visit Diagnoses: Fibromyalgia: She has generalized hyperalgesia and positive tender points due to fibromyalgia.  She experiences intermittent myalgias and muscle tenderness.  She takes baclofen 10 mg twice daily  as needed for muscle spasms.  She presents today with trochanteric bursitis of the right hip.  She requested a cortisone injection but she has upcoming C-spine fusion on 05/04/2019 with Dr. Christella Noa so we will hold off on a cortisone injection at this time.  She was advised to notify us once she is cleared to have a cortisone injection and can return to the office at that time.  She continues to have chronic fatigue secondary to insomnia.  She is been experiencing interrupted sleep at night due to nocturnal pain in her neck.  She takes trazodone 50 mg 1 tablet by mouth at bedtime.  Discussed the importance of regular exercise and good sleep hygiene.  Other fatigue: She has chronic fatigue secondary to insomnia.   Chronic insomnia -She takes trazodone 50 mg 1 tablet by mouth at bedtime for insomnia.  Trapezius muscle spasm - She takes baclofen 10 mg by mouth twice daily for muscle spasms.  Primary osteoarthritis of both hands: She experiences intermittent discomfort and stiffness in both hands.  She very mild osteoarthritic changes in both hands. She has complete fist formation bilaterally.  No synovitis was noted.  Joint protection and muscle strengthening were discussed  Primary osteoarthritis of both knees - The x-ray showed moderate osteoarthritis and mild chondromalacia patella.  She presents today with increased discomfort in the left knee joint, which started 1 week ago.  She did not have any injury or fall prior to the onset of symptoms.  She has good ROM of both knee joints.  No warmth or effusion noted.  She requested a left knee joint cortisone injection, but she has upcoming C-Spine surgery on 05/04/19. She was advised to notify us if she would like to return for a cortisone injection once she has been cleared by Dr. Christella Noa.  She was also given a handout of knee joint exercises.  Greater trochanteric bursitis of both hips: She presents today with trochanter bursitis of the right hip.  She has been experiencing increased discomfort for the past 1 week.  She has tenderness to palpation of both trochanteric bursa on exam.  She requested a right trochanteric bursa cortisone injection but we decided to hold off until after her C-spine surgery.  She will schedule an office visit when she has been cleared by her neurosurgeon to have a cortisone injection.  She was given a handout of exercises to perform.  Osteoporosis, post-menopausal: She was advised to schedule her bone density.  Hair loss - She has noticed worsening hair loss recently.  She requested a referral to dermatology for further evaluation and treatment.  Referral was placed today.  Plan: Ambulatory referral to Dermatology  Other medical conditions are listed as follows:   History of hyperlipidemia  History of depression  History of diabetes mellitus   Orders: Orders Placed This Encounter  Procedures  . Ambulatory referral to Dermatology   No orders of the defined types were placed in this encounter.    Follow-Up  Instructions: Return in about 6 months (around 10/19/2019) for Fibromyalgia, Osteoarthritis.   Ofilia Neas, PA-C  Note - This record has been created using Dragon software.  Chart creation errors have been sought, but may not always  have been located. Such creation errors do not reflect on  the standard of medical care.

## 2019-04-13 ENCOUNTER — Ambulatory Visit: Payer: Medicare PPO | Admitting: Physician Assistant

## 2019-04-13 DIAGNOSIS — M4722 Other spondylosis with radiculopathy, cervical region: Secondary | ICD-10-CM | POA: Diagnosis not present

## 2019-04-13 DIAGNOSIS — M5030 Other cervical disc degeneration, unspecified cervical region: Secondary | ICD-10-CM | POA: Diagnosis not present

## 2019-04-13 DIAGNOSIS — I10 Essential (primary) hypertension: Secondary | ICD-10-CM | POA: Diagnosis not present

## 2019-04-18 ENCOUNTER — Encounter: Payer: Self-pay | Admitting: Physician Assistant

## 2019-04-18 ENCOUNTER — Ambulatory Visit: Payer: Medicare PPO | Admitting: Physician Assistant

## 2019-04-18 ENCOUNTER — Other Ambulatory Visit: Payer: Self-pay

## 2019-04-18 VITALS — BP 113/61 | HR 84 | Resp 16 | Ht 62.0 in | Wt 128.2 lb

## 2019-04-18 DIAGNOSIS — M810 Age-related osteoporosis without current pathological fracture: Secondary | ICD-10-CM

## 2019-04-18 DIAGNOSIS — M7061 Trochanteric bursitis, right hip: Secondary | ICD-10-CM

## 2019-04-18 DIAGNOSIS — Z8659 Personal history of other mental and behavioral disorders: Secondary | ICD-10-CM

## 2019-04-18 DIAGNOSIS — M19041 Primary osteoarthritis, right hand: Secondary | ICD-10-CM

## 2019-04-18 DIAGNOSIS — Z8639 Personal history of other endocrine, nutritional and metabolic disease: Secondary | ICD-10-CM

## 2019-04-18 DIAGNOSIS — M170 Bilateral primary osteoarthritis of knee: Secondary | ICD-10-CM | POA: Diagnosis not present

## 2019-04-18 DIAGNOSIS — M19042 Primary osteoarthritis, left hand: Secondary | ICD-10-CM

## 2019-04-18 DIAGNOSIS — M62838 Other muscle spasm: Secondary | ICD-10-CM | POA: Diagnosis not present

## 2019-04-18 DIAGNOSIS — L659 Nonscarring hair loss, unspecified: Secondary | ICD-10-CM

## 2019-04-18 DIAGNOSIS — M797 Fibromyalgia: Secondary | ICD-10-CM | POA: Diagnosis not present

## 2019-04-18 DIAGNOSIS — R5383 Other fatigue: Secondary | ICD-10-CM | POA: Diagnosis not present

## 2019-04-18 DIAGNOSIS — F5104 Psychophysiologic insomnia: Secondary | ICD-10-CM

## 2019-04-18 DIAGNOSIS — M7062 Trochanteric bursitis, left hip: Secondary | ICD-10-CM

## 2019-04-25 DIAGNOSIS — H2513 Age-related nuclear cataract, bilateral: Secondary | ICD-10-CM | POA: Diagnosis not present

## 2019-04-25 DIAGNOSIS — H401131 Primary open-angle glaucoma, bilateral, mild stage: Secondary | ICD-10-CM | POA: Diagnosis not present

## 2019-04-25 DIAGNOSIS — H04123 Dry eye syndrome of bilateral lacrimal glands: Secondary | ICD-10-CM | POA: Diagnosis not present

## 2019-04-25 DIAGNOSIS — H524 Presbyopia: Secondary | ICD-10-CM | POA: Diagnosis not present

## 2019-04-27 DIAGNOSIS — Z1152 Encounter for screening for COVID-19: Secondary | ICD-10-CM | POA: Diagnosis not present

## 2019-05-09 DIAGNOSIS — R634 Abnormal weight loss: Secondary | ICD-10-CM | POA: Diagnosis not present

## 2019-05-09 DIAGNOSIS — K219 Gastro-esophageal reflux disease without esophagitis: Secondary | ICD-10-CM | POA: Diagnosis not present

## 2019-05-09 DIAGNOSIS — R1032 Left lower quadrant pain: Secondary | ICD-10-CM | POA: Diagnosis not present

## 2019-05-09 DIAGNOSIS — K5904 Chronic idiopathic constipation: Secondary | ICD-10-CM | POA: Diagnosis not present

## 2019-05-17 DIAGNOSIS — E785 Hyperlipidemia, unspecified: Secondary | ICD-10-CM | POA: Diagnosis not present

## 2019-05-17 DIAGNOSIS — I10 Essential (primary) hypertension: Secondary | ICD-10-CM | POA: Diagnosis not present

## 2019-05-17 DIAGNOSIS — F064 Anxiety disorder due to known physiological condition: Secondary | ICD-10-CM | POA: Diagnosis not present

## 2019-05-17 DIAGNOSIS — E1169 Type 2 diabetes mellitus with other specified complication: Secondary | ICD-10-CM | POA: Diagnosis not present

## 2019-05-17 DIAGNOSIS — M797 Fibromyalgia: Secondary | ICD-10-CM | POA: Diagnosis not present

## 2019-05-19 DIAGNOSIS — R634 Abnormal weight loss: Secondary | ICD-10-CM | POA: Diagnosis not present

## 2019-05-19 DIAGNOSIS — F4323 Adjustment disorder with mixed anxiety and depressed mood: Secondary | ICD-10-CM | POA: Diagnosis not present

## 2019-05-19 DIAGNOSIS — R1084 Generalized abdominal pain: Secondary | ICD-10-CM | POA: Diagnosis not present

## 2019-05-19 DIAGNOSIS — E1169 Type 2 diabetes mellitus with other specified complication: Secondary | ICD-10-CM | POA: Diagnosis not present

## 2019-05-29 DIAGNOSIS — Z1152 Encounter for screening for COVID-19: Secondary | ICD-10-CM | POA: Diagnosis not present

## 2019-06-01 HISTORY — PX: NECK SURGERY: SHX720

## 2019-06-02 DIAGNOSIS — M4722 Other spondylosis with radiculopathy, cervical region: Secondary | ICD-10-CM | POA: Diagnosis not present

## 2019-06-02 DIAGNOSIS — M4802 Spinal stenosis, cervical region: Secondary | ICD-10-CM | POA: Diagnosis not present

## 2019-06-19 ENCOUNTER — Other Ambulatory Visit: Payer: Self-pay | Admitting: Rheumatology

## 2019-06-19 NOTE — Telephone Encounter (Signed)
Last Visit: 04/18/2019 Next Visit: 10/17/2019  Okay to refill per Dr. Estanislado Pandy.

## 2019-06-22 DIAGNOSIS — M5030 Other cervical disc degeneration, unspecified cervical region: Secondary | ICD-10-CM | POA: Diagnosis not present

## 2019-06-22 DIAGNOSIS — M4722 Other spondylosis with radiculopathy, cervical region: Secondary | ICD-10-CM | POA: Diagnosis not present

## 2019-06-29 DIAGNOSIS — R634 Abnormal weight loss: Secondary | ICD-10-CM | POA: Diagnosis not present

## 2019-06-29 DIAGNOSIS — K219 Gastro-esophageal reflux disease without esophagitis: Secondary | ICD-10-CM | POA: Diagnosis not present

## 2019-06-29 DIAGNOSIS — K5904 Chronic idiopathic constipation: Secondary | ICD-10-CM | POA: Diagnosis not present

## 2019-07-27 DIAGNOSIS — Z5181 Encounter for therapeutic drug level monitoring: Secondary | ICD-10-CM | POA: Diagnosis not present

## 2019-07-27 DIAGNOSIS — Z79891 Long term (current) use of opiate analgesic: Secondary | ICD-10-CM | POA: Diagnosis not present

## 2019-07-27 DIAGNOSIS — Z79899 Other long term (current) drug therapy: Secondary | ICD-10-CM | POA: Diagnosis not present

## 2019-07-27 DIAGNOSIS — M4722 Other spondylosis with radiculopathy, cervical region: Secondary | ICD-10-CM | POA: Diagnosis not present

## 2019-08-30 DIAGNOSIS — E785 Hyperlipidemia, unspecified: Secondary | ICD-10-CM | POA: Diagnosis not present

## 2019-08-30 DIAGNOSIS — I10 Essential (primary) hypertension: Secondary | ICD-10-CM | POA: Diagnosis not present

## 2019-08-30 DIAGNOSIS — E1169 Type 2 diabetes mellitus with other specified complication: Secondary | ICD-10-CM | POA: Diagnosis not present

## 2019-08-30 DIAGNOSIS — R634 Abnormal weight loss: Secondary | ICD-10-CM | POA: Diagnosis not present

## 2019-08-30 DIAGNOSIS — F064 Anxiety disorder due to known physiological condition: Secondary | ICD-10-CM | POA: Diagnosis not present

## 2019-09-01 DIAGNOSIS — I10 Essential (primary) hypertension: Secondary | ICD-10-CM | POA: Diagnosis not present

## 2019-09-01 DIAGNOSIS — E1169 Type 2 diabetes mellitus with other specified complication: Secondary | ICD-10-CM | POA: Diagnosis not present

## 2019-09-11 ENCOUNTER — Other Ambulatory Visit: Payer: Self-pay | Admitting: Rheumatology

## 2019-09-11 NOTE — Telephone Encounter (Signed)
Last Visit: 04/18/2019 Next Visit: 10/17/2019  Last Fill: 06/19/2019  Okay to refill Trazodone?

## 2019-09-25 DIAGNOSIS — M4722 Other spondylosis with radiculopathy, cervical region: Secondary | ICD-10-CM | POA: Diagnosis not present

## 2019-09-28 ENCOUNTER — Other Ambulatory Visit: Payer: Self-pay | Admitting: Physician Assistant

## 2019-09-28 NOTE — Telephone Encounter (Signed)
Last Visit: 04/18/2019 Next Visit: 10/17/2019  Okay to refill Trazodone?

## 2019-10-03 NOTE — Progress Notes (Signed)
Office Visit Note  Patient: Linda [name]             Date of Birth: [birth year]           MRN: [id number]             PCP: Lucianne Lei, MD Referring: Lucianne Lei, MD Visit Date: 10/17/2019 Occupation: @GUAROCC @  Subjective:  Neck pain   History of Present Illness: Linda [name] is a 69 y.o. female with history of fibromyalgia and osteoarthritis.  Patient reports that she has intermittent myalgias and muscle tenderness due to underlying fibromyalgia.  She states that her myalgias are typically exacerbated by weather changes.  She states that she had a C-spine fusion performed in May 2021 by Dr. Christella Noa initially noticed significant improvement in her symptoms.  Over the past 3 weeks she has been experiencing some increased discomfort and radiating pain down the right arm.  She had a recent appointment with Dr. Christella Noa and has another follow-up visit in 1 month.  If her symptoms progressively worsen the plan is to order an MRI of the C-spine.  She has been taking baclofen 10 mg 1 tablet by mouth at bedtime as needed for muscle spasms.  She also continues to have chronic lower back pain but denies any symptoms of radiculopathy.  She uses Lidoderm patches which are effective at managing her discomfort.    She has been taking hydrocodone 5-325 mg 1 tablet by mouth every 8 hours as needed for pain relief.  Patient reports she has intermittent discomfort in her left knee joint but denies any warmth or joint swelling at this time.  She uses Voltaren gel topically as needed for pain relief. She has chronic fatigue secondary to insomnia. She continues to take trazodone 50 mg 1 tablet by mouth at bedtime for insomnia.     Activities of Daily Living:  Patient reports morning stiffness for 20 minutes.   Patient Reports nocturnal pain.  Difficulty dressing/grooming: Denies Difficulty climbing stairs: Reports Difficulty getting out of chair: Reports Difficulty using hands for taps, buttons,  cutlery, and/or writing: Denies  Review of Systems  Constitutional: Positive for fatigue.  HENT: Positive for mouth dryness. Negative for mouth sores and nose dryness.   Eyes: Positive for itching and dryness. Negative for pain and visual disturbance.  Respiratory: Negative for cough, hemoptysis, shortness of breath and difficulty breathing.   Cardiovascular: Negative for chest pain, palpitations, hypertension and swelling in legs/feet.  Gastrointestinal: Positive for constipation. Negative for blood in stool and diarrhea.  Endocrine: Negative for increased urination.  Genitourinary: Negative for difficulty urinating and painful urination.  Musculoskeletal: Positive for arthralgias, joint pain, myalgias, muscle weakness, morning stiffness, muscle tenderness and myalgias. Negative for joint swelling.  Skin: Negative for color change, pallor, rash, hair loss, nodules/bumps, redness, skin tightness, ulcers and sensitivity to sunlight.  Allergic/Immunologic: Negative for susceptible to infections.  Neurological: Negative for memory loss and weakness.  Hematological: Negative for swollen glands.  Psychiatric/Behavioral: Positive for depressed mood. Negative for confusion and sleep disturbance. The patient is nervous/anxious.     PMFS History:  Patient Active Problem List   Diagnosis Date Noted  . IDA (iron deficiency anemia) 12/19/2018  . Fatigue 12/10/2015  . Primary osteoarthritis of both hands 12/10/2015  . Low back pain without sciatica 12/10/2015  . Type 2 diabetes mellitus  12/10/2015  . Depression 12/10/2015  . Chronic pansinusitis 09/26/2015  . Genetic testing 01/11/2014  . Hives 11/11/2012  . Chest pain 04/23/2012  .  Hyperlipidemia   . Chronic insomnia 01/29/2011  . Perennial allergic rhinitis 01/29/2011  . Fibromyalgia 01/29/2011    Past Medical History:  Diagnosis Date  . Allergic rhinitis   . Arthritis   . Breast cancer ([place]) 2002   left  . Diabetes ([place])   .  Fibromyalgia   . Glaucoma   . Hypercholesteremia   . Hyperlipidemia   . Personal history of chemotherapy 05/2000   left breast  . Personal history of radiation therapy 2002   Letf breast    Family History  Problem Relation Age of Onset  . Throat cancer Father   . Breast cancer Maternal Aunt   . CAD Mother   . Stroke Mother   . Breast cancer Daughter    Past Surgical History:  Procedure Laterality Date  . APPENDECTOMY  1989  . BREAST BIOPSY Left 04/26/2000  . BREAST LUMPECTOMY Left 05/14/2000   left  . CHOLECYSTECTOMY  1989  . KNEE ARTHROSCOPY Right   . NECK SURGERY  06/01/2019  . SHOULDER SURGERY Left   . VAGINAL HYSTERECTOMY  1987   Social History   Social History Narrative  . Not on file   Immunization History  Administered Date(s) Administered  . Influenza, High Dose Seasonal PF 10/28/2017  . Influenza,inj,Quad PF,6+ Mos 12/17/2014  . Influenza-Unspecified 11/12/2015  . PFIZER SARS-COV-2 Vaccination 02/18/2019, 03/11/2019  . Pneumococcal Conjugate-13 07/20/2016, 10/28/2017     Objective: Vital Signs: BP 134/82 (BP Location: Right Arm, Patient Position: Sitting, Cuff Size: Normal)   Pulse 84   Resp 13   Ht 5\' 2"  (1.575 m)   Wt 128 lb (58.1 kg)   BMI 23.41 kg/m    Physical Exam Vitals and nursing note reviewed.  Constitutional:      Appearance: She is well-developed.  HENT:     Head: Normocephalic and atraumatic.  Eyes:     Conjunctiva/sclera: Conjunctivae normal.  Pulmonary:     Effort: Pulmonary effort is normal.  Abdominal:     Palpations: Abdomen is soft.  Musculoskeletal:     Cervical back: Normal range of motion.  Skin:    General: Skin is warm and dry.     Capillary Refill: Capillary refill takes less than 2 seconds.  Neurological:     Mental Status: She is alert and oriented to person, place, and time.  Psychiatric:        Behavior: Behavior normal.      Musculoskeletal Exam: C-spine has limited flexion and extension.  Trapezius  muscle tension and muscle tenderness bilaterally.  Thoracic and lumbar spine have good range of motion.  Midline spinal tenderness in the lumbar region.  Shoulder joints, elbow joints, wrist joints, MCPs, PIPs, DIPs have good range of motion with no synovitis.  She has complete fist formation bilaterally.  Hip joints have good range of motion with no discomfort.  She has tenderness over bilateral trochanteric bursa.  Knee joints have good range of motion with no warmth or effusion.  Mild crepitus noted bilaterally.  Ankle joints have good range of motion with no tenderness or inflammation.  CDAI Exam: CDAI Score: -- Patient Global: --; Provider Global: -- Swollen: --; Tender: -- Joint Exam 10/17/2019   No joint exam has been documented for this visit   There is currently no information documented on the homunculus. Go to the Rheumatology activity and complete the homunculus joint exam.  Investigation: No additional findings.  Imaging: No results found.  Recent Labs: Lab Results  Component Value Date  WBC 6.7 12/16/2018   HGB 10.1 (L) 12/16/2018   PLT 283 12/16/2018   NA 142 12/16/2018   K 3.8 12/16/2018   CL 106 12/16/2018   CO2 30 12/16/2018   GLUCOSE 108 (H) 12/16/2018   BUN 20 12/16/2018   CREATININE 0.75 12/16/2018   BILITOT 0.2 (L) 12/16/2018   ALKPHOS 48 12/16/2018   AST 19 12/16/2018   ALT 13 12/16/2018   PROT 7.0 12/16/2018   ALBUMIN 4.5 12/16/2018   CALCIUM 9.5 12/16/2018   GFRAA >60 12/16/2018    Speciality Comments: No specialty comments available.  Procedures:  No procedures performed Allergies: Sulfa antibiotics   Assessment / Plan:     Visit Diagnoses: Fibromyalgia: She has generalized hyperalgesia and positive tender points on exam.  She experiences intermittent fibromyalgia flares usually exacerbated by weather changes or strenuous activities.  She has been unable to go to water aerobics recently which she feels has also been contributing to some of  her increased myalgias.  She has ongoing trapezius muscle tension and muscle tenderness bilaterally.  She had a C-spine fusion performed in May 2021 and initially had resolution of symptoms but has started to have progressively worsening pain over the past 3 weeks.  She has trapezius muscle tension and muscle tenderness on examination today.  She requested a refill of Lidoderm patches which have been helpful in the past.  She continues to take baclofen 10 mg 1 tablet by mouth at bedtime for muscle spasms.  She is also been taking hydrocodone 5-325 mg 1 tablet by mouth every 8 hours for pain relief.  She continues to have chronic fatigue secondary to insomnia.  She takes trazodone 50 mg 1 tablet by mouth at bedtime for insomnia.  We discussed the importance of regular exercise and good sleep hygiene.  She will follow-up in the office in 6 months.  Other fatigue: She has chronic fatigue secondary to insomnia.  We discussed the importance of regular exercise.  She has been unable to go to water aerobics recently which she feels has contributed to some of her worsening fatigue.  She was encouraged to increase her exercise regimen.  Chronic insomnia: She takes trazodone 50 mg 1 tablet by mouth at bedtime for insomnia.  Trapezius muscle spasm: She has trapezius muscle tension and muscle tenderness bilaterally.  She has been taking baclofen 10 mg 1 tablet at bedtime as needed for muscle spasms.  She requested a refill of Lidoderm patches which provide significant relief.  Refills of both prescriptions were sent to the pharmacy.  S/P cervical spinal fusion: May 2021 performed by Dr. Christella Noa.  She initially had complete resolution of symptoms but over the past 3 weeks has started to have increased discomfort in radiating pain down the right arm.  She had a recent appointment with Dr. Christella Noa and has an upcoming appointment in 1 month.  She continues to take hydrocodone every 8 hours as needed for pain relief and  baclofen 10 mg 1 tablet by mouth at bedtime for muscle spasms.  She requested a refill of Lidoderm patches which have been helpful at managing her discomfort in the past.  If her discomfort persists or worsens the plan is to schedule MRI of the C-spine for further evaluation and management by Dr. Christella Noa.  Primary osteoarthritis of both hands: She is not experiencing any discomfort or stiffness in her hands at this time.  She has no joint tenderness or inflammation on examination today.  She has complete fist formation bilaterally.  Joint protection and muscle strengthening were discussed.  Primary osteoarthritis of both knees: She experiences intermittent discomfort in the left knee joint.  She has good range of motion of both knees on exam with no warmth or effusion.  She uses Voltaren gel topically as needed for pain relief.  Greater trochanteric bursitis of both hips: She continues to experience intermittent discomfort due to underlying trochanter bursitis bilaterally. She has tenderness to palpation on exam today. She has good ROM of both hip joints with no discomfort. We discussed the importance of changing positions frequently.  We also discussed the importance of having a comfortable mattress or mattress Topper she was given a handout of exercises to perform at home.  If her symptoms persist or worsen she was advised to notify us and we will refer her to physical therapy.  Osteoporosis, post-menopausal -DEXA ordered by Dr. Estanislado Pandy. She is taking vitamin D 50,000 units by mouth once weekly.   History of depression:  She is taking Wellbutrin as prescribed.  Other medical conditions are listed as follows:   History of hyperlipidemia  History of diabetes mellitus   Orders: No orders of the defined types were placed in this encounter.  Meds ordered this encounter  Medications  . baclofen (LIORESAL) 10 MG tablet    Sig: TAKE 1 TABLET BY MOUTH AT BEDTIME AS NEEDED FOR MUSCLE SPASMS     Dispense:  90 tablet    Refill:  0  . diclofenac Sodium (VOLTAREN) 1 % GEL    Sig: Apply 2-4 grams to affected joint 4 times daily as needed.    Dispense:  400 g    Refill:  2  . lidocaine (LIDODERM) 5 %    Sig: Place 1 patch onto the skin daily. Remove & Discard patch within 12 hours or as directed by MD    Dispense:  30 patch    Refill:  0     Follow-Up Instructions: Return in about 6 months (around 04/15/2020) for Fibromyalgia, Osteoarthritis.   Ofilia Neas, PA-C  Note - This record has been created using Dragon software.  Chart creation errors have been sought, but may not always  have been located. Such creation errors do not reflect on  the standard of medical care.

## 2019-10-09 DIAGNOSIS — M4722 Other spondylosis with radiculopathy, cervical region: Secondary | ICD-10-CM | POA: Diagnosis not present

## 2019-10-09 DIAGNOSIS — Z681 Body mass index (BMI) 19 or less, adult: Secondary | ICD-10-CM | POA: Diagnosis not present

## 2019-10-09 DIAGNOSIS — R030 Elevated blood-pressure reading, without diagnosis of hypertension: Secondary | ICD-10-CM | POA: Diagnosis not present

## 2019-10-17 ENCOUNTER — Other Ambulatory Visit: Payer: Self-pay

## 2019-10-17 ENCOUNTER — Ambulatory Visit: Payer: Medicare PPO | Admitting: Physician Assistant

## 2019-10-17 ENCOUNTER — Encounter: Payer: Self-pay | Admitting: Physician Assistant

## 2019-10-17 VITALS — BP 134/82 | HR 84 | Resp 13 | Ht 62.0 in | Wt 128.0 lb

## 2019-10-17 DIAGNOSIS — H401131 Primary open-angle glaucoma, bilateral, mild stage: Secondary | ICD-10-CM | POA: Diagnosis not present

## 2019-10-17 DIAGNOSIS — Z8659 Personal history of other mental and behavioral disorders: Secondary | ICD-10-CM | POA: Diagnosis not present

## 2019-10-17 DIAGNOSIS — M19041 Primary osteoarthritis, right hand: Secondary | ICD-10-CM | POA: Diagnosis not present

## 2019-10-17 DIAGNOSIS — M7062 Trochanteric bursitis, left hip: Secondary | ICD-10-CM

## 2019-10-17 DIAGNOSIS — M810 Age-related osteoporosis without current pathological fracture: Secondary | ICD-10-CM

## 2019-10-17 DIAGNOSIS — M170 Bilateral primary osteoarthritis of knee: Secondary | ICD-10-CM | POA: Diagnosis not present

## 2019-10-17 DIAGNOSIS — R5383 Other fatigue: Secondary | ICD-10-CM | POA: Diagnosis not present

## 2019-10-17 DIAGNOSIS — F5104 Psychophysiologic insomnia: Secondary | ICD-10-CM | POA: Diagnosis not present

## 2019-10-17 DIAGNOSIS — M7061 Trochanteric bursitis, right hip: Secondary | ICD-10-CM | POA: Diagnosis not present

## 2019-10-17 DIAGNOSIS — H04123 Dry eye syndrome of bilateral lacrimal glands: Secondary | ICD-10-CM | POA: Diagnosis not present

## 2019-10-17 DIAGNOSIS — M62838 Other muscle spasm: Secondary | ICD-10-CM | POA: Diagnosis not present

## 2019-10-17 DIAGNOSIS — M797 Fibromyalgia: Secondary | ICD-10-CM

## 2019-10-17 DIAGNOSIS — Z8639 Personal history of other endocrine, nutritional and metabolic disease: Secondary | ICD-10-CM

## 2019-10-17 DIAGNOSIS — H5203 Hypermetropia, bilateral: Secondary | ICD-10-CM | POA: Diagnosis not present

## 2019-10-17 DIAGNOSIS — H524 Presbyopia: Secondary | ICD-10-CM | POA: Diagnosis not present

## 2019-10-17 DIAGNOSIS — M19042 Primary osteoarthritis, left hand: Secondary | ICD-10-CM

## 2019-10-17 DIAGNOSIS — Z981 Arthrodesis status: Secondary | ICD-10-CM

## 2019-10-17 DIAGNOSIS — H2513 Age-related nuclear cataract, bilateral: Secondary | ICD-10-CM | POA: Diagnosis not present

## 2019-10-17 DIAGNOSIS — H52221 Regular astigmatism, right eye: Secondary | ICD-10-CM | POA: Diagnosis not present

## 2019-10-17 MED ORDER — LIDOCAINE 5 % EX PTCH: 1.0000 | MEDICATED_PATCH | CUTANEOUS | 0 refills | Status: DC

## 2019-10-17 MED ORDER — BACLOFEN 10 MG PO TABS: ORAL_TABLET | ORAL | 0 refills | Status: DC

## 2019-10-17 MED ORDER — DICLOFENAC SODIUM 1 % EX GEL: CUTANEOUS | 2 refills | Status: DC

## 2019-10-17 NOTE — Patient Instructions (Signed)
Iliotibial Band Syndrome Rehab Ask your health care provider which exercises are safe for you. Do exercises exactly as told by your health care provider and adjust them as directed. It is normal to feel mild stretching, pulling, tightness, or discomfort as you do these exercises. Stop right away if you feel sudden pain or your pain gets significantly worse. Do not begin these exercises until told by your health care provider. Stretching and range-of-motion exercises These exercises warm up your muscles and joints and improve the movement and flexibility of your hip and pelvis. Quadriceps stretch, prone  1. Lie on your abdomen on a firm surface, such as a bed or padded floor (prone position). 2. Bend your left / right knee and reach back to hold your ankle or pant leg. If you cannot reach your ankle or pant leg, loop a belt around your foot and grab the belt instead. 3. Gently pull your heel toward your buttocks. Your knee should not slide out to the side. You should feel a stretch in the front of your thigh and knee (quadriceps). 4. Hold this position for __________ seconds. Repeat __________ times. Complete this exercise __________ times a day. Iliotibial band stretch An iliotibial band is a strong band of muscle tissue that runs from the outer side of your hip to the outer side of your thigh and knee. 1. Lie on your side with your left / right leg in the top position. 2. Bend both of your knees and grab your left / right ankle. Stretch out your bottom arm to help you balance. 3. Slowly bring your top knee back so your thigh goes behind your trunk. 4. Slowly lower your top leg toward the floor until you feel a gentle stretch on the outside of your left / right hip and thigh. If you do not feel a stretch and your knee will not fall farther, place the heel of your other foot on top of your knee and pull your knee down toward the floor with your foot. 5. Hold this position for __________  seconds. Repeat __________ times. Complete this exercise __________ times a day. Strengthening exercises These exercises build strength and endurance in your hip and pelvis. Endurance is the ability to use your muscles for a long time, even after they get tired. Straight leg raises, side-lying This exercise strengthens the muscles that rotate the leg at the hip and move it away from your body (hip abductors). 1. Lie on your side with your left / right leg in the top position. Lie so your head, shoulder, hip, and knee line up. You may bend your bottom knee to help you balance. 2. Roll your hips slightly forward so your hips are stacked directly over each other and your left / right knee is facing forward. 3. Tense the muscles in your outer thigh and lift your top leg 4-6 inches (10-15 cm). 4. Hold this position for __________ seconds. 5. Slowly return to the starting position. Let your muscles relax completely before doing another repetition. Repeat __________ times. Complete this exercise __________ times a day. Leg raises, prone This exercise strengthens the muscles that move the hips (hip extensors). 1. Lie on your abdomen on your bed or a firm surface. You can put a pillow under your hips if that is more comfortable for your lower back. 2. Bend your left / right knee so your foot is straight up in the air. 3. Squeeze your buttocks muscles and lift your left / right thigh   off the bed. Do not let your back arch. 4. Tense your thigh muscle as hard as you can without increasing any knee pain. 5. Hold this position for __________ seconds. 6. Slowly lower your leg to the starting position and allow it to relax completely. Repeat __________ times. Complete this exercise __________ times a day. Hip hike 1. Stand sideways on a bottom step. Stand on your left / right leg with your other foot unsupported next to the step. You can hold on to the railing or wall for balance if needed. 2. Keep your knees  straight and your torso square. Then lift your left / right hip up toward the ceiling. 3. Slowly let your left / right hip lower toward the floor, past the starting position. Your foot should get closer to the floor. Do not lean or bend your knees. Repeat __________ times. Complete this exercise __________ times a day. This information is not intended to replace advice given to you by your health care provider. Make sure you discuss any questions you have with your health care provider. Document Revised: 05/05/2018 Document Reviewed: 11/03/2017 Elsevier Patient Education  2020 [employer]   Hip Bursitis Rehab Ask your health care provider which exercises are safe for you. Do exercises exactly as told by your health care provider and adjust them as directed. It is normal to feel mild stretching, pulling, tightness, or discomfort as you do these exercises. Stop right away if you feel sudden pain or your pain gets worse. Do not begin these exercises until told by your health care provider. Stretching exercise This exercise warms up your muscles and joints and improves the movement and flexibility of your hip. This exercise also helps to relieve pain and stiffness. Iliotibial band stretch An iliotibial band is a strong band of muscle tissue that runs from the outer side of your hip to the outer side of your thigh and knee. 1. Lie on your side with your left / right leg in the top position. 2. Bend your left / right knee and grab your ankle. Stretch out your bottom arm to help you balance. 3. Slowly bring your knee back so your thigh is behind your body. 4. Slowly lower your knee toward the floor until you feel a gentle stretch on the outside of your left / right thigh. If you do not feel a stretch and your knee will not fall farther, place the heel of your other foot on top of your knee and pull your knee down toward the floor with your foot. 5. Hold this position for __________ seconds. 6. Slowly  return to the starting position. Repeat __________ times. Complete this exercise __________ times a day. Strengthening exercises These exercises build strength and endurance in your hip and pelvis. Endurance is the ability to use your muscles for a long time, even after they get tired. Bridge This exercise strengthens the muscles that move your thigh backward (hip extensors). 1. Lie on your back on a firm surface with your knees bent and your feet flat on the floor. 2. Tighten your buttocks muscles and lift your buttocks off the floor until your trunk is level with your thighs. ? Do not arch your back. ? You should feel the muscles working in your buttocks and the back of your thighs. If you do not feel these muscles, slide your feet 1-2 inches (2.5-5 cm) farther away from your buttocks. ? If this exercise is too easy, try doing it with your arms crossed   over your chest. 3. Hold this position for __________ seconds. 4. Slowly lower your hips to the starting position. 5. Let your muscles relax completely after each repetition. Repeat __________ times. Complete this exercise __________ times a day. Squats This exercise strengthens the muscles in front of your thigh and knee (quadriceps). 1. Stand in front of a table, with your feet and knees pointing straight ahead. You may rest your hands on the table for balance but not for support. 2. Slowly bend your knees and lower your hips like you are going to sit in a chair. ? Keep your weight over your heels, not over your toes. ? Keep your lower legs upright so they are parallel with the table legs. ? Do not let your hips go lower than your knees. ? Do not bend lower than told by your health care provider. ? If your hip pain increases, do not bend as low. 3. Hold the squat position for __________ seconds. 4. Slowly push with your legs to return to standing. Do not use your hands to pull yourself to standing. Repeat __________ times. Complete this  exercise __________ times a day. Hip hike 1. Stand sideways on a bottom step. Stand on your left / right leg with your other foot unsupported next to the step. You can hold on to the railing or wall for balance if needed. 2. Keep your knees straight and your torso square. Then lift your left / right hip up toward the ceiling. 3. Hold this position for __________ seconds. 4. Slowly let your left / right hip lower toward the floor, past the starting position. Your foot should get closer to the floor. Do not lean or bend your knees. Repeat __________ times. Complete this exercise __________ times a day. Single leg stand 1. Without shoes, stand near a railing or in a doorway. You may hold on to the railing or door frame as needed for balance. 2. Squeeze your left / right buttock muscles, then lift up your other foot. ? Do not let your left / right hip push out to the side. ? It is helpful to stand in front of a mirror for this exercise so you can watch your hip. 3. Hold this position for __________ seconds. Repeat __________ times. Complete this exercise __________ times a day. This information is not intended to replace advice given to you by your health care provider. Make sure you discuss any questions you have with your health care provider. Document Revised: 05/09/2018 Document Reviewed: 05/09/2018 Elsevier Patient Education  2020 [employer] Inc[employer]  

## 2019-10-18 ENCOUNTER — Telehealth: Payer: Self-pay | Admitting: *Deleted

## 2019-10-18 NOTE — Telephone Encounter (Signed)
Submitted a Prior Authorization request to [facility] for Lidocaine Patches  via Cover My Meds. Will update once we receive a response.

## 2019-10-20 NOTE — Telephone Encounter (Signed)
Received fax from [facility] stating lidocaine patches are denied. I called patient and advised her. Advised she can try good rx as an option. Patient verbalized understanding.  Sent denial letter to scan center.

## 2019-10-27 DIAGNOSIS — M542 Cervicalgia: Secondary | ICD-10-CM | POA: Diagnosis not present

## 2019-10-27 DIAGNOSIS — M4722 Other spondylosis with radiculopathy, cervical region: Secondary | ICD-10-CM | POA: Diagnosis not present

## 2019-11-16 DIAGNOSIS — H903 Sensorineural hearing loss, bilateral: Secondary | ICD-10-CM | POA: Diagnosis not present

## 2019-11-16 DIAGNOSIS — H9313 Tinnitus, bilateral: Secondary | ICD-10-CM | POA: Diagnosis not present

## 2019-11-28 ENCOUNTER — Other Ambulatory Visit: Payer: Self-pay | Admitting: Hematology & Oncology

## 2019-11-28 DIAGNOSIS — Z1231 Encounter for screening mammogram for malignant neoplasm of breast: Secondary | ICD-10-CM

## 2019-12-15 ENCOUNTER — Other Ambulatory Visit: Payer: Self-pay | Admitting: *Deleted

## 2019-12-15 DIAGNOSIS — D509 Iron deficiency anemia, unspecified: Secondary | ICD-10-CM

## 2019-12-15 DIAGNOSIS — C50919 Malignant neoplasm of unspecified site of unspecified female breast: Secondary | ICD-10-CM

## 2019-12-18 ENCOUNTER — Inpatient Hospital Stay: Payer: Medicare PPO | Attending: Hematology & Oncology

## 2019-12-18 ENCOUNTER — Encounter: Payer: Self-pay | Admitting: Hematology & Oncology

## 2019-12-18 ENCOUNTER — Inpatient Hospital Stay (HOSPITAL_BASED_OUTPATIENT_CLINIC_OR_DEPARTMENT_OTHER): Payer: Medicare PPO | Admitting: Hematology & Oncology

## 2019-12-18 ENCOUNTER — Other Ambulatory Visit: Payer: Self-pay

## 2019-12-18 VITALS — BP 128/69 | HR 99 | Temp 98.4°F | Resp 20 | Wt 126.0 lb

## 2019-12-18 DIAGNOSIS — C50912 Malignant neoplasm of unspecified site of left female breast: Secondary | ICD-10-CM

## 2019-12-18 DIAGNOSIS — Z7189 Other specified counseling: Secondary | ICD-10-CM

## 2019-12-18 DIAGNOSIS — C50919 Malignant neoplasm of unspecified site of unspecified female breast: Secondary | ICD-10-CM

## 2019-12-18 DIAGNOSIS — D509 Iron deficiency anemia, unspecified: Secondary | ICD-10-CM

## 2019-12-18 DIAGNOSIS — Z853 Personal history of malignant neoplasm of breast: Secondary | ICD-10-CM | POA: Insufficient documentation

## 2019-12-18 DIAGNOSIS — Z79899 Other long term (current) drug therapy: Secondary | ICD-10-CM | POA: Insufficient documentation

## 2019-12-18 DIAGNOSIS — Z171 Estrogen receptor negative status [ER-]: Secondary | ICD-10-CM

## 2019-12-18 HISTORY — DX: Other specified counseling: Z71.89

## 2019-12-18 HISTORY — DX: Malignant neoplasm of unspecified site of left female breast: C50.912

## 2019-12-18 LAB — CMP (CANCER CENTER ONLY)
ALT: 9 U/L (ref 0–44)
AST: 13 U/L — ABNORMAL LOW (ref 15–41)
Albumin: 4.4 g/dL (ref 3.5–5.0)
Alkaline Phosphatase: 65 U/L (ref 38–126)
Anion gap: 8 (ref 5–15)
BUN: 17 mg/dL (ref 8–23)
CO2: 31 mmol/L (ref 22–32)
Calcium: 10.2 mg/dL (ref 8.9–10.3)
Chloride: 103 mmol/L (ref 98–111)
Creatinine: 0.8 mg/dL (ref 0.44–1.00)
GFR, Estimated: >60 mL/min (ref >60)
Glucose, Bld: 97 mg/dL (ref 70–99)
Potassium: 4.2 mmol/L (ref 3.5–5.1)
Sodium: 142 mmol/L (ref 135–145)
Total Bilirubin: 0.2 mg/dL — ABNORMAL LOW (ref 0.3–1.2)
Total Protein: 7.4 g/dL (ref 6.5–8.1)

## 2019-12-18 LAB — CBC WITH DIFFERENTIAL (CANCER CENTER ONLY)
Abs Immature Granulocytes: 0.03 10*3/uL (ref 0.00–0.07)
Basophils Absolute: 0.1 10*3/uL (ref 0.0–0.1)
Basophils Relative: 1 %
Eosinophils Absolute: 0.4 10*3/uL (ref 0.0–0.5)
Eosinophils Relative: 5 %
HCT: 37.9 % (ref 36.0–46.0)
Hemoglobin: 12 g/dL (ref 12.0–15.0)
Immature Granulocytes: 0 %
Lymphocytes Relative: 33 %
Lymphs Abs: 2.8 10*3/uL (ref 0.7–4.0)
MCH: 27.9 pg (ref 26.0–34.0)
MCHC: 31.7 g/dL (ref 30.0–36.0)
MCV: 88.1 fL (ref 80.0–100.0)
Monocytes Absolute: 0.3 10*3/uL (ref 0.1–1.0)
Monocytes Relative: 4 %
Neutro Abs: 4.8 10*3/uL (ref 1.7–7.7)
Neutrophils Relative %: 57 %
Platelet Count: 303 10*3/uL (ref 150–400)
RBC: 4.3 MIL/uL (ref 3.87–5.11)
RDW: 14 % (ref 11.5–15.5)
WBC Count: 8.4 10*3/uL (ref 4.0–10.5)
nRBC: 0 % (ref 0.0–0.2)

## 2019-12-18 NOTE — Progress Notes (Signed)
Hematology and Oncology Follow Up Visit  Linda [name] [id number] 01/21/1951 69 y.o. 12/18/2019   Principle Diagnosis:  Stage IIA (T2 N0 M0) ductal carcinoma of the left breast triple-negative Iron deficiency anemia  Current Therapy:   IV iron as indicated-Venofer given on 12/2018   Interim History:  Linda [name] is here today for her annual follow-up.  She actually had neck surgery in May.  This was done as an outpatient.  She was have a lot of pain in the neck and shoulder.  The surgery really helped her out.  In July she went down to [place] with her family.  That a wonderful time down in [place].  Overall she is doing quite nicely.  She is got have a nice Thanksgiving with her daughter.  She has had no problems with nausea or vomiting.  There is no cough or shortness of breath.  She has had no issues with the coronavirus.  Thankfully, her hemoglobin is doing better.  She received Venofer last year.  She had 5 doses of Venofer.  At that time, her ferritin was 18 with an iron saturation of only 9%.  I am not sure when she had her last colonoscopy.  We may have to find out about this.  We will have to see what her iron levels are.  She is due for a mammogram in December.  Overall, her performance status is ECOG 1.   Medications:  Allergies as of 12/18/2019      Reactions   Sulfa Antibiotics Rash      Medication List       Accurate as of December 18, 2019  4:12 PM. If you have any questions, ask your nurse or doctor.        baclofen 10 MG tablet Commonly known as: LIORESAL TAKE 1 TABLET BY MOUTH AT BEDTIME AS NEEDED FOR MUSCLE SPASMS   buPROPion 300 MG 24 hr tablet Commonly known as: WELLBUTRIN XL Take 300 mg by mouth daily.   cyclobenzaprine 10 MG tablet Commonly known as: FLEXERIL   cycloSPORINE 0.05 % ophthalmic emulsion Commonly known as: RESTASIS Place 1 drop into both eyes 2 (two) times daily.   diclofenac Sodium 1 % Gel Commonly known  as: VOLTAREN Apply 2-4 grams to affected joint 4 times daily as needed.   HYDROcodone-acetaminophen 5-325 MG tablet Commonly known as: NORCO/VICODIN Take 1 tablet by mouth every 8 (eight) hours as needed. for pain   Janumet XR [phone] MG Tb24 Generic drug: SitaGLIPtin-MetFORMIN HCl Take 1 tablet by mouth daily.   lidocaine 5 % Commonly known as: Lidoderm Place 1 patch onto the skin daily. Remove & Discard patch within 12 hours or as directed by MD   Lumigan 0.01 % Soln Generic drug: bimatoprost 1 DROP INTO BOTH EYES IN THE EVENING   traZODone 50 MG tablet Commonly known as: DESYREL TAKE 1 TABLET BY MOUTH EVERYDAY AT BEDTIME   Vitamin D (Ergocalciferol) 1.25 MG (50000 UNIT) Caps capsule Commonly known as: DRISDOL Take 1 capsule (50,000 Units total) by mouth once a week.       Allergies:  Allergies  Allergen Reactions  . Sulfa Antibiotics Rash    Past Medical History, Surgical history, Social history, and Family History were reviewed and updated.  Review of Systems: Review of Systems  Constitutional: Negative.   HENT: Negative.   Eyes: Negative.   Respiratory: Negative.   Cardiovascular: Negative.   Gastrointestinal: Negative.   Genitourinary: Negative.   Musculoskeletal: Negative.  Skin: Negative.   Neurological: Negative.   Endo/Heme/Allergies: Negative.   Psychiatric/Behavioral: Negative.     Physical Exam:  weight is 126 lb 0.6 oz (57.2 kg). Her oral temperature is 98.4 F (36.9 C). Her blood pressure is 128/69 and her pulse is 99. Her respiration is 20 and oxygen saturation is 99%.   Wt Readings from Last 3 Encounters:  12/18/19 126 lb 0.6 oz (57.2 kg)  10/17/19 128 lb (58.1 kg)  04/18/19 128 lb 3.2 oz (58.2 kg)    Physical Exam Vitals reviewed.  Constitutional:      Comments: On her breast exam, her right breast shows no masses, edema or erythema.  There is no right axillary adenopathy.  Left breast is slightly contracted from past surgery and  radiation.  She has a well-healed lumpectomy scar at the 2 o'clock position.  She has no distinct mass in the left breast.  There is no left axillary adenopathy.  HENT:     Head: Normocephalic and atraumatic.  Eyes:     Pupils: Pupils are equal, round, and reactive to light.  Cardiovascular:     Rate and Rhythm: Normal rate and regular rhythm.     Heart sounds: Normal heart sounds.  Pulmonary:     Effort: Pulmonary effort is normal.     Breath sounds: Normal breath sounds.  Abdominal:     General: Bowel sounds are normal.     Palpations: Abdomen is soft.  Musculoskeletal:        General: No tenderness or deformity. Normal range of motion.     Cervical back: Normal range of motion.  Lymphadenopathy:     Cervical: No cervical adenopathy.  Skin:    General: Skin is warm and dry.     Findings: No erythema or rash.  Neurological:     Mental Status: She is alert and oriented to person, place, and time.  Psychiatric:        Behavior: Behavior normal.        Thought Content: Thought content normal.        Judgment: Judgment normal.      Lab Results  Component Value Date   WBC 8.4 12/18/2019   HGB 12.0 12/18/2019   HCT 37.9 12/18/2019   MCV 88.1 12/18/2019   PLT 303 12/18/2019   Lab Results  Component Value Date   FERRITIN 18 12/16/2018   IRON 29 (L) 12/16/2018   TIBC 309 12/16/2018   UIBC 280 12/16/2018   IRONPCTSAT 9 (L) 12/16/2018   Lab Results  Component Value Date   RETICCTPCT 1.3 04/23/2012   RBC 4.30 12/18/2019   No results found for: KPAFRELGTCHN, LAMBDASER, KAPLAMBRATIO No results found for: IGGSERUM, IGA, IGMSERUM No results found for: Odetta Pink, SPEI   Chemistry      Component Value Date/Time   NA 142 12/16/2018 1530   NA 139 12/11/2016 1252   NA 138 12/13/2015 1352   K 3.8 12/16/2018 1530   K 3.9 12/11/2016 1252   K 4.1 12/13/2015 1352   CL 106 12/16/2018 1530   CL 104 12/11/2016 1252   CO2  30 12/16/2018 1530   CO2 28 12/11/2016 1252   CO2 23 12/13/2015 1352   BUN 20 12/16/2018 1530   BUN 14 12/11/2016 1252   BUN 17.2 12/13/2015 1352   CREATININE 0.75 12/16/2018 1530   CREATININE 0.72 12/11/2016 1252   CREATININE 0.8 12/13/2015 1352      Component Value Date/Time  CALCIUM 9.5 12/16/2018 1530   CALCIUM 9.6 12/11/2016 1252   CALCIUM 9.7 12/13/2015 1352   ALKPHOS 48 12/16/2018 1530   ALKPHOS 80 12/11/2016 1252   ALKPHOS 79 12/13/2015 1352   AST 19 12/16/2018 1530   AST 14 12/13/2015 1352   ALT 13 12/16/2018 1530   ALT 13 12/13/2015 1352   BILITOT 0.2 (L) 12/16/2018 1530   BILITOT <0.22 12/13/2015 1352      Impression and Plan: Linda [name] is a very pleasant 69 yo African American female with history of stage IIa (T2N0M0) ductal carcinoma of the left breast, triple negative. She was diagnosed and treated over 15 years ago with lumpectomy and chemotherapy (Adriamycin/Cytoxan).   Again I do not see any evidence of recurrent disease.  I have believe that she is cured given that she has triple negative disease.  We will have to see what her iron levels look like.  We will have to find out when she had a last colonoscopy.  Otherwise, we will plan to get her back to see [place] in another year.   Volanda Napoleon, MD 11/22/20214:12 PM

## 2019-12-19 ENCOUNTER — Telehealth: Payer: Self-pay | Admitting: Hematology & Oncology

## 2019-12-19 LAB — FERRITIN: Ferritin: 151 ng/mL (ref 11–307)

## 2019-12-19 LAB — IRON AND TIBC
Iron: 90 ug/dL (ref 41–142)
Saturation Ratios: 30 % (ref 21–57)
TIBC: 301 ug/dL (ref 236–444)
UIBC: 211 ug/dL (ref 120–384)

## 2019-12-19 LAB — LACTATE DEHYDROGENASE: LDH: 149 U/L (ref 98–192)

## 2019-12-19 NOTE — Telephone Encounter (Signed)
Appointments scheduled calendar printed & mailed per 11/22 los 

## 2020-01-04 ENCOUNTER — Other Ambulatory Visit: Payer: Self-pay

## 2020-01-04 ENCOUNTER — Ambulatory Visit
Admission: RE | Admit: 2020-01-04 | Discharge: 2020-01-04 | Disposition: A | Discharge status: Home / Self Care | Payer: Medicare PPO | Source: Ambulatory Visit | Attending: Hematology & Oncology | Admitting: Hematology & Oncology

## 2020-01-04 DIAGNOSIS — Z1231 Encounter for screening mammogram for malignant neoplasm of breast: Secondary | ICD-10-CM | POA: Diagnosis not present

## 2020-01-04 DIAGNOSIS — I10 Essential (primary) hypertension: Secondary | ICD-10-CM | POA: Diagnosis not present

## 2020-01-04 DIAGNOSIS — E1169 Type 2 diabetes mellitus with other specified complication: Secondary | ICD-10-CM | POA: Diagnosis not present

## 2020-01-06 ENCOUNTER — Other Ambulatory Visit: Payer: Self-pay | Admitting: Physician Assistant

## 2020-01-08 ENCOUNTER — Other Ambulatory Visit: Payer: Self-pay | Admitting: Physician Assistant

## 2020-01-08 DIAGNOSIS — E088 Diabetes mellitus due to underlying condition with unspecified complications: Secondary | ICD-10-CM | POA: Diagnosis not present

## 2020-01-08 DIAGNOSIS — M810 Age-related osteoporosis without current pathological fracture: Secondary | ICD-10-CM

## 2020-01-08 DIAGNOSIS — E785 Hyperlipidemia, unspecified: Secondary | ICD-10-CM | POA: Diagnosis not present

## 2020-01-08 DIAGNOSIS — G5603 Carpal tunnel syndrome, bilateral upper limbs: Secondary | ICD-10-CM | POA: Diagnosis not present

## 2020-01-08 DIAGNOSIS — E1169 Type 2 diabetes mellitus with other specified complication: Secondary | ICD-10-CM | POA: Diagnosis not present

## 2020-01-08 DIAGNOSIS — M797 Fibromyalgia: Secondary | ICD-10-CM | POA: Diagnosis not present

## 2020-01-08 NOTE — Telephone Encounter (Signed)
Last Visit: 10/17/2019 Next Visit: 04/16/2020  Okay to refill per Dr. Estanislado Pandy

## 2020-01-10 DIAGNOSIS — M4722 Other spondylosis with radiculopathy, cervical region: Secondary | ICD-10-CM | POA: Diagnosis not present

## 2020-01-10 DIAGNOSIS — M5136 Other intervertebral disc degeneration, lumbar region: Secondary | ICD-10-CM | POA: Diagnosis not present

## 2020-01-31 NOTE — Progress Notes (Signed)
Office Visit Note  Patient: Linda [name]             Date of Birth: 01-Aug-1950           MRN: [id number]             PCP: Lucianne Lei, MD Referring: Lucianne Lei, MD Visit Date: 02/01/2020 Occupation: @GUAROCC @  Subjective:  Other (Left knee pain )   History of Present Illness: Heavin Falkenhagen is a 70 y.o. female with history of fibromyalgia, osteoarthritis.  She states she has been having increased pain and discomfort in her left knee joint and requests knee joint cortisone injection.  She denies any history of instability or joint swelling.  She states the pain has been gradually getting worse and she is having difficulty walking at times.  None of the other joints are as painful.  She continues to have some neck and lower back pain.  She has some generalized pain from fibromyalgia.  Activities of Daily Living:  Patient reports morning stiffness for 0 minutes.   Patient Reports nocturnal pain.  Difficulty dressing/grooming: Denies Difficulty climbing stairs: Reports Difficulty getting out of chair: Reports Difficulty using hands for taps, buttons, cutlery, and/or writing: Denies  Review of Systems  Constitutional: Negative for fatigue.  HENT: Positive for mouth dryness. Negative for mouth sores and nose dryness.   Eyes: Positive for dryness. Negative for pain and itching.  Respiratory: Negative for shortness of breath and difficulty breathing.   Cardiovascular: Negative for chest pain and palpitations.  Gastrointestinal: Negative for blood in stool, constipation and diarrhea.  Endocrine: Negative for increased urination.  Genitourinary: Negative for difficulty urinating.  Musculoskeletal: Positive for arthralgias, joint pain, myalgias, muscle weakness, muscle tenderness and myalgias. Negative for joint swelling and morning stiffness.  Skin: Negative for color change, rash and redness.  Allergic/Immunologic: Negative for susceptible to infections.  Neurological:  Positive for numbness, headaches and memory loss. Negative for dizziness and weakness.  Hematological: Positive for bruising/bleeding tendency.  Psychiatric/Behavioral: Negative for confusion.    PMFS History:  Patient Active Problem List   Diagnosis Date Noted  . Stage II infiltrating ductal breast carcinoma, ER-, left ([place]) 12/18/2019  . Goals of care, counseling/discussion 12/18/2019  . IDA (iron deficiency anemia) 12/19/2018  . Fatigue 12/10/2015  . Primary osteoarthritis of both hands 12/10/2015  . Low back pain without sciatica 12/10/2015  . Type 2 diabetes mellitus  12/10/2015  . Depression 12/10/2015  . Chronic pansinusitis 09/26/2015  . Genetic testing 01/11/2014  . Hives 11/11/2012  . Chest pain 04/23/2012  . Hyperlipidemia   . Chronic insomnia 01/29/2011  . Perennial allergic rhinitis 01/29/2011  . Fibromyalgia 01/29/2011    Past Medical History:  Diagnosis Date  . Allergic rhinitis   . Arthritis   . Breast cancer ([place]) 2002   left  . Diabetes ([place])   . Fibromyalgia   . Glaucoma   . Goals of care, counseling/discussion 12/18/2019  . Hypercholesteremia   . Hyperlipidemia   . Personal history of chemotherapy 05/2000   left breast  . Personal history of radiation therapy 2002   [place] breast  . Stage II infiltrating ductal breast carcinoma, ER-, left ([place]) 12/18/2019    Family History  Problem Relation Age of Onset  . Throat cancer Father   . Breast cancer Maternal Aunt   . CAD Mother   . Stroke Mother   . Breast cancer Daughter    Past Surgical History:  Procedure Laterality Date  . APPENDECTOMY  1989  . BREAST BIOPSY Left 04/26/2000  . BREAST LUMPECTOMY Left 05/14/2000   left  . CHOLECYSTECTOMY  1989  . KNEE ARTHROSCOPY Right   . NECK SURGERY  06/01/2019  . SHOULDER SURGERY Left   . VAGINAL HYSTERECTOMY  1987   Social History   Social History Narrative  . Not on file   Immunization History  Administered Date(s) Administered  . Influenza,  High Dose Seasonal PF 10/28/2017  . Influenza,inj,Quad PF,6+ Mos 12/17/2014  . Influenza-Unspecified 11/12/2015  . PFIZER SARS-COV-2 Vaccination 02/18/2019, 03/11/2019  . Pneumococcal Conjugate-13 07/20/2016, 10/28/2017     Objective: Vital Signs: BP 129/76 (BP Location: Right Arm, Patient Position: Sitting, Cuff Size: Normal)   Pulse (!) 106   Resp 13   Ht 5\' 2"  (1.575 m)   Wt 131 lb (59.4 kg)   BMI 23.96 kg/m    Physical Exam Vitals and nursing note reviewed.  Constitutional:      Appearance: She is well-developed and well-nourished.  HENT:     Head: Normocephalic and atraumatic.  Eyes:     Extraocular Movements: EOM normal.     Conjunctiva/sclera: Conjunctivae normal.  Cardiovascular:     Rate and Rhythm: Normal rate and regular rhythm.     Pulses: Intact distal pulses.     Heart sounds: Normal heart sounds.  Pulmonary:     Effort: Pulmonary effort is normal.     Breath sounds: Normal breath sounds.  Abdominal:     General: Bowel sounds are normal.     Palpations: Abdomen is soft.  Musculoskeletal:     Cervical back: Normal range of motion.  Lymphadenopathy:     Cervical: No cervical adenopathy.  Skin:    General: Skin is warm and dry.     Capillary Refill: Capillary refill takes less than 2 seconds.  Neurological:     Mental Status: She is alert and oriented to person, place, and time.  Psychiatric:        Mood and Affect: Mood and affect normal.        Behavior: Behavior normal.      Musculoskeletal Exam: C-spine with good range of motion.  She has some discomfort range of motion of her lumbar spine.  Shoulder joints, elbow joints, wrist joints, MCPs PIPs and DIPs with good range of motion with no synovitis.  She had good range of motion of her hip joints, she had good range of motion of bilateral knee joints with discomfort in her left knee joint especially over the medial joint line.  No warmth swelling or effusion was noted.  There was no tenderness over  ankles or MTPs.  She generalized hyperalgesia and tenderness over bilateral trochanteric bursa.  CDAI Exam: CDAI Score: - Patient Global: -; Provider Global: - Swollen: -; Tender: - Joint Exam 02/01/2020   No joint exam has been documented for this visit   There is currently no information documented on the homunculus. Go to the Rheumatology activity and complete the homunculus joint exam.  Investigation: No additional findings.  Imaging: MM 3D SCREEN BREAST BILATERAL  Result Date: 01/09/2020 CLINICAL DATA:  Screening. History of left lumpectomy 2002. EXAM: DIGITAL SCREENING BILATERAL MAMMOGRAM WITH TOMO AND CAD COMPARISON:  Previous exam(s). ACR Breast Density Category c: The breast tissue is heterogeneously dense, which may obscure small masses. FINDINGS: There are no findings suspicious for malignancy. Images were processed with CAD. IMPRESSION: No mammographic evidence of malignancy. A result letter of this screening mammogram will be mailed directly to the  patient. RECOMMENDATION: Screening mammogram in one year. (Code:SM-B-01Y) BI-RADS CATEGORY  1: Negative. Electronically Signed   By: Everlean Alstrom M.D.   On: 01/09/2020 09:14   XR KNEE 3 VIEW LEFT  Result Date: 02/01/2020 Moderate medial compartment narrowing and moderate patellofemoral narrowing was noted.  No chondrocalcinosis was noted. Impression: These findings are consistent moderate osteoarthritis and moderate chondromalacia patella.   Recent Labs: Lab Results  Component Value Date   WBC 8.4 12/18/2019   HGB 12.0 12/18/2019   PLT 303 12/18/2019   NA 142 12/18/2019   K 4.2 12/18/2019   CL 103 12/18/2019   CO2 31 12/18/2019   GLUCOSE 97 12/18/2019   BUN 17 12/18/2019   CREATININE 0.80 12/18/2019   BILITOT 0.2 (L) 12/18/2019   ALKPHOS 65 12/18/2019   AST 13 (L) 12/18/2019   ALT 9 12/18/2019   PROT 7.4 12/18/2019   ALBUMIN 4.4 12/18/2019   CALCIUM 10.2 12/18/2019   GFRAA >60 12/16/2018    Speciality  Comments: No specialty comments available.  Procedures:  No procedures performed Allergies: Sulfa antibiotics   Assessment / Plan:     Visit Diagnoses:   Acute pain of left knee -patient has chronic pain in her left knee joint which has become more severe in the last few days.  No warmth swelling or effusion was noted.  X-rays were consistent moderate osteoarthritis with moderate chondromalacia patella.  She has been having discomfort over the medial joint line.  She requested a cortisone injection.  She is diabetic.  I advised against cortisone shot.  We will apply for viscosupplementation injections for the left knee joint.  Once approved we will contact patient..  I have given her a handout on knee joint exercises.  A prescription for knee joints brace was also given.  Plan: XR KNEE 3 VIEW LEFT.  X-ray showed moderate osteoarthritis and moderate chondromalacia patella.  Findings were discussed with the patient.  Fibromyalgia-she continues to have some generalized pain and discomfort.  She has positive tender points.  Chronic insomnia - trazodone 50 mg 1 tablet by mouth at bedtime for insomnia.  She states trazodone has been effective for insomnia.  Other fatigue-she continues to have some fatigue.  Trapezius muscle spasm -she has some tenderness in the trapezius area.  Stretching exercises were discussed.  S/P cervical spinal fusion - May 2021 performed by Dr. Christella Noa.  She continues to have some chronic neck pain.  Chronic lower back pain without sciatica-she complains of some discomfort in the lower back as well.  Have given her a handout on back exercises.  Primary osteoarthritis of both hands-joint protection was discussed.  Primary osteoarthritis of both knees  Greater trochanteric bursitis of both hips-she has some tenderness over trochanteric area.  IT band stretches were discussed.  History of hyperlipidemia  History of diabetes mellitus-patient states her diabetes is quite  well controlled.  History of depression  Osteoporosis, post-menopausal-I do not have DEXA report.  She is currently not on any medications.    Orders: Orders Placed This Encounter  Procedures  . XR KNEE 3 VIEW LEFT   No orders of the defined types were placed in this encounter.   Follow-Up Instructions: Return in about 6 months (around 07/31/2020) for Osteoarthritis, FMS.   Bo Merino, MD  Note - This record has been created using Editor, commissioning.  Chart creation errors have been sought, but may not always  have been located. Such creation errors do not reflect on  the standard of medical  care.

## 2020-02-01 ENCOUNTER — Other Ambulatory Visit: Payer: Self-pay

## 2020-02-01 ENCOUNTER — Ambulatory Visit: Payer: Self-pay

## 2020-02-01 ENCOUNTER — Ambulatory Visit: Payer: Medicare PPO | Admitting: Rheumatology

## 2020-02-01 ENCOUNTER — Telehealth: Payer: Self-pay

## 2020-02-01 ENCOUNTER — Encounter: Payer: Self-pay | Admitting: Rheumatology

## 2020-02-01 VITALS — BP 129/76 | HR 106 | Resp 13 | Ht 62.0 in | Wt 131.0 lb

## 2020-02-01 DIAGNOSIS — M19042 Primary osteoarthritis, left hand: Secondary | ICD-10-CM

## 2020-02-01 DIAGNOSIS — G8929 Other chronic pain: Secondary | ICD-10-CM

## 2020-02-01 DIAGNOSIS — M7061 Trochanteric bursitis, right hip: Secondary | ICD-10-CM | POA: Diagnosis not present

## 2020-02-01 DIAGNOSIS — M7062 Trochanteric bursitis, left hip: Secondary | ICD-10-CM

## 2020-02-01 DIAGNOSIS — M62838 Other muscle spasm: Secondary | ICD-10-CM | POA: Diagnosis not present

## 2020-02-01 DIAGNOSIS — Z981 Arthrodesis status: Secondary | ICD-10-CM | POA: Diagnosis not present

## 2020-02-01 DIAGNOSIS — R5383 Other fatigue: Secondary | ICD-10-CM

## 2020-02-01 DIAGNOSIS — M545 Low back pain, unspecified: Secondary | ICD-10-CM

## 2020-02-01 DIAGNOSIS — M19041 Primary osteoarthritis, right hand: Secondary | ICD-10-CM | POA: Diagnosis not present

## 2020-02-01 DIAGNOSIS — M170 Bilateral primary osteoarthritis of knee: Secondary | ICD-10-CM | POA: Diagnosis not present

## 2020-02-01 DIAGNOSIS — F5104 Psychophysiologic insomnia: Secondary | ICD-10-CM

## 2020-02-01 DIAGNOSIS — Z8639 Personal history of other endocrine, nutritional and metabolic disease: Secondary | ICD-10-CM

## 2020-02-01 DIAGNOSIS — M810 Age-related osteoporosis without current pathological fracture: Secondary | ICD-10-CM

## 2020-02-01 DIAGNOSIS — Z8659 Personal history of other mental and behavioral disorders: Secondary | ICD-10-CM

## 2020-02-01 DIAGNOSIS — M25562 Pain in left knee: Secondary | ICD-10-CM | POA: Diagnosis not present

## 2020-02-01 DIAGNOSIS — M797 Fibromyalgia: Secondary | ICD-10-CM

## 2020-02-01 NOTE — Telephone Encounter (Signed)
Please apply for left knee visco, per Dr. Deveshwar. Thanks!  °

## 2020-02-01 NOTE — Patient Instructions (Signed)
Journal for Nurse Practitioners, 15(4), 263-267. Retrieved November 01, 2017 from [email or web address]">  °Knee Exercises °Ask your health care provider which exercises are safe for you. Do exercises exactly as told by your health care provider and adjust them as directed. It is normal to feel mild stretching, pulling, tightness, or discomfort as you do these exercises. Stop right away if you feel sudden pain or your pain gets worse. Do not begin these exercises until told by your health care provider. °Stretching and range-of-motion exercises °These exercises warm up your muscles and joints and improve the movement and flexibility of your knee. These exercises also help to relieve pain and swelling. °Knee extension, prone °1. Lie on your abdomen (prone position) on a bed. °2. Place your left / right knee just beyond the edge of the surface so your knee is not on the bed. You can put a towel under your left / right thigh just above your kneecap for comfort. °3. Relax your leg muscles and allow gravity to straighten your knee (extension). You should feel a stretch behind your left / right knee. °4. Hold this position for __________ seconds. °5. Scoot up so your knee is supported between repetitions. °Repeat __________ times. Complete this exercise __________ times a day. °Knee flexion, active ° °1. Lie on your back with both legs straight. If this causes back discomfort, bend your left / right knee so your foot is flat on the floor. °2. Slowly slide your left / right heel back toward your buttocks. Stop when you feel a gentle stretch in the front of your knee or thigh (flexion). °3. Hold this position for __________ seconds. °4. Slowly slide your left / right heel back to the starting position. °Repeat __________ times. Complete this exercise __________ times a day. °Quadriceps stretch, prone ° °1. Lie on your abdomen on a firm surface, such as a bed or padded floor. °2. Bend your left / right knee and hold  your ankle. If you cannot reach your ankle or pant leg, loop a belt around your foot and grab the belt instead. °3. Gently pull your heel toward your buttocks. Your knee should not slide out to the side. You should feel a stretch in the front of your thigh and knee (quadriceps). °4. Hold this position for __________ seconds. °Repeat __________ times. Complete this exercise __________ times a day. °Hamstring, supine °1. Lie on your back (supine position). °2. Loop a belt or towel over the ball of your left / right foot. The ball of your foot is on the walking surface, right under your toes. °3. Straighten your left / right knee and slowly pull on the belt to raise your leg until you feel a gentle stretch behind your knee (hamstring). °? Do not let your knee bend while you do this. °? Keep your other leg flat on the floor. °4. Hold this position for __________ seconds. °Repeat __________ times. Complete this exercise __________ times a day. °Strengthening exercises °These exercises build strength and endurance in your knee. Endurance is the ability to use your muscles for a long time, even after they get tired. °Quadriceps, isometric °This exercise stretches the muscles in front of your thigh (quadriceps) without moving your knee joint (isometric). °1. Lie on your back with your left / right leg extended and your other knee bent. Put a rolled towel or small pillow under your knee if told by your health care provider. °2. Slowly tense the muscles in the front of your left /   right thigh. You should see your kneecap slide up toward your hip or see increased dimpling just above the knee. This motion will push the back of the knee toward the floor. °3. For __________ seconds, hold the muscle as tight as you can without increasing your pain. °4. Relax the muscles slowly and completely. °Repeat __________ times. Complete this exercise __________ times a day. °Straight leg raises °This exercise stretches the muscles in front  of your thigh (quadriceps) and the muscles that move your hips (hip flexors). °1. Lie on your back with your left / right leg extended and your other knee bent. °2. Tense the muscles in the front of your left / right thigh. You should see your kneecap slide up or see increased dimpling just above the knee. Your thigh may even shake a bit. °3. Keep these muscles tight as you raise your leg 4-6 inches (10-15 cm) off the floor. Do not let your knee bend. °4. Hold this position for __________ seconds. °5. Keep these muscles tense as you lower your leg. °6. Relax your muscles slowly and completely after each repetition. °Repeat __________ times. Complete this exercise __________ times a day. °Hamstring, isometric °1. Lie on your back on a firm surface. °2. Bend your left / right knee about __________ degrees. °3. Dig your left / right heel into the surface as if you are trying to pull it toward your buttocks. Tighten the muscles in the back of your thighs (hamstring) to "dig" as hard as you can without increasing any pain. °4. Hold this position for __________ seconds. °5. Release the tension gradually and allow your muscles to relax completely for __________ seconds after each repetition. °Repeat __________ times. Complete this exercise __________ times a day. °Hamstring curls °If told by your health care provider, do this exercise while wearing ankle weights. Begin with __________ lb weights. Then increase the weight by 1 lb (0.5 kg) increments. Do not wear ankle weights that are more than __________ lb. °1. Lie on your abdomen with your legs straight. °2. Bend your left / right knee as far as you can without feeling pain. Keep your hips flat against the floor. °3. Hold this position for __________ seconds. °4. Slowly lower your leg to the starting position. °Repeat __________ times. Complete this exercise __________ times a day. °Squats °This exercise strengthens the muscles in front of your thigh and knee  (quadriceps). °1. Stand in front of a table, with your feet and knees pointing straight ahead. You may rest your hands on the table for balance but not for support. °2. Slowly bend your knees and lower your hips like you are going to sit in a chair. °? Keep your weight over your heels, not over your toes. °? Keep your lower legs upright so they are parallel with the table legs. °? Do not let your hips go lower than your knees. °? Do not bend lower than told by your health care provider. °? If your knee pain increases, do not bend as low. °3. Hold the squat position for __________ seconds. °4. Slowly push with your legs to return to standing. Do not use your hands to pull yourself to standing. °Repeat __________ times. Complete this exercise __________ times a day. °Wall slides °This exercise strengthens the muscles in front of your thigh and knee (quadriceps). °1. Lean your back against a smooth wall or door, and walk your feet out 18-24 inches (46-61 cm) from it. °2. Place your feet hip-width apart. °3.   Slowly slide down the wall or door until your knees bend __________ degrees. Keep your knees over your heels, not over your toes. Keep your knees in line with your hips. °4. Hold this position for __________ seconds. °Repeat __________ times. Complete this exercise __________ times a day. °Straight leg raises °This exercise strengthens the muscles that rotate the leg at the hip and move it away from your body (hip abductors). °1. Lie on your side with your left / right leg in the top position. Lie so your head, shoulder, knee, and hip line up. You may bend your bottom knee to help you keep your balance. °2. Roll your hips slightly forward so your hips are stacked directly over each other and your left / right knee is facing forward. °3. Leading with your heel, lift your top leg 4-6 inches (10-15 cm). You should feel the muscles in your outer hip lifting. °? Do not let your foot drift forward. °? Do not let your knee  roll toward the ceiling. °4. Hold this position for __________ seconds. °5. Slowly return your leg to the starting position. °6. Let your muscles relax completely after each repetition. °Repeat __________ times. Complete this exercise __________ times a day. °Straight leg raises °This exercise stretches the muscles that move your hips away from the front of the pelvis (hip extensors). °1. Lie on your abdomen on a firm surface. You can put a pillow under your hips if that is more comfortable. °2. Tense the muscles in your buttocks and lift your left / right leg about 4-6 inches (10-15 cm). Keep your knee straight as you lift your leg. °3. Hold this position for __________ seconds. °4. Slowly lower your leg to the starting position. °5. Let your leg relax completely after each repetition. °Repeat __________ times. Complete this exercise __________ times a day. °This information is not intended to replace advice given to you by your health care provider. Make sure you discuss any questions you have with your health care provider. °Document Revised: 11/02/2017 Document Reviewed: 11/02/2017 °Elsevier Patient Education © 2020 [employer] ° °Back Exercises °The following exercises strengthen the muscles that help to support the trunk and back. They also help to keep the lower back flexible. Doing these exercises can help to prevent back pain or lessen existing pain. °· If you have back pain or discomfort, try doing these exercises 2-3 times each day or as told by your health care provider. °· As your pain improves, do them once each day, but increase the number of times that you repeat the steps for each exercise (do more repetitions). °· To prevent the recurrence of back pain, continue to do these exercises once each day or as told by your health care provider. °Do exercises exactly as told by your health care provider and adjust them as directed. It is normal to feel mild stretching, pulling, tightness, or discomfort  as you do these exercises, but you should stop right away if you feel sudden pain or your pain gets worse. °Exercises °Single knee to chest °Repeat these steps 3-5 times for each leg: °1. Lie on your back on a firm bed or the floor with your legs extended. °2. Bring one knee to your chest. Your other leg should stay extended and in contact with the floor. °3. Hold your knee in place by grabbing your knee or thigh with both hands and hold. °4. Pull on your knee until you feel a gentle stretch in your lower back   or buttocks. °5. Hold the stretch for 10-30 seconds. °6. Slowly release and straighten your leg. °Pelvic tilt °Repeat these steps 5-10 times: °1. Lie on your back on a firm bed or the floor with your legs extended. °2. Bend your knees so they are pointing toward the ceiling and your feet are flat on the floor. °3. Tighten your lower abdominal muscles to press your lower back against the floor. This motion will tilt your pelvis so your tailbone points up toward the ceiling instead of pointing to your feet or the floor. °4. With gentle tension and even breathing, hold this position for 5-10 seconds. °Cat-cow °Repeat these steps until your lower back becomes more flexible: °1. Get into a hands-and-knees position on a firm surface. Keep your hands under your shoulders, and keep your knees under your hips. You may place padding under your knees for comfort. °2. Let your head hang down toward your chest. Contract your abdominal muscles and point your tailbone toward the floor so your lower back becomes rounded like the back of a cat. °3. Hold this position for 5 seconds. °4. Slowly lift your head, let your abdominal muscles relax and point your tailbone up toward the ceiling so your back forms a sagging arch like the back of a cow. °5. Hold this position for 5 seconds. ° °Press-ups °Repeat these steps 5-10 times: °1. Lie on your abdomen (face-down) on the floor. °2. Place your palms near your head, about  shoulder-width apart. °3. Keeping your back as relaxed as possible and keeping your hips on the floor, slowly straighten your arms to raise the top half of your body and lift your shoulders. Do not use your back muscles to raise your upper torso. You may adjust the placement of your hands to make yourself more comfortable. °4. Hold this position for 5 seconds while you keep your back relaxed. °5. Slowly return to lying flat on the floor. ° °Bridges °Repeat these steps 10 times: °1. Lie on your back on a firm surface. °2. Bend your knees so they are pointing toward the ceiling and your feet are flat on the floor. Your arms should be flat at your sides, next to your body. °3. Tighten your buttocks muscles and lift your buttocks off the floor until your waist is at almost the same height as your knees. You should feel the muscles working in your buttocks and the back of your thighs. If you do not feel these muscles, slide your feet 1-2 inches farther away from your buttocks. °4. Hold this position for 3-5 seconds. °5. Slowly lower your hips to the starting position, and allow your buttocks muscles to relax completely. °If this exercise is too easy, try doing it with your arms crossed over your chest. °Abdominal crunches °Repeat these steps 5-10 times: °1. Lie on your back on a firm bed or the floor with your legs extended. °2. Bend your knees so they are pointing toward the ceiling and your feet are flat on the floor. °3. Cross your arms over your chest. °4. Tip your chin slightly toward your chest without bending your neck. °5. Tighten your abdominal muscles and slowly raise your trunk (torso) high enough to lift your shoulder blades a tiny bit off the floor. Avoid raising your torso higher than that because it can put too much stress on your low back and does not help to strengthen your abdominal muscles. °6. Slowly return to your starting position. °Back lifts °Repeat these steps 5-10   times: °1. Lie on your abdomen  (face-down) with your arms at your sides, and rest your forehead on the floor. °2. Tighten the muscles in your legs and your buttocks. °3. Slowly lift your chest off the floor while you keep your hips pressed to the floor. Keep the back of your head in line with the curve in your back. Your eyes should be looking at the floor. °4. Hold this position for 3-5 seconds. °5. Slowly return to your starting position. °Contact a health care provider if: °· Your back pain or discomfort gets much worse when you do an exercise. °· Your worsening back pain or discomfort does not lessen within 2 hours after you exercise. °If you have any of these problems, stop doing these exercises right away. Do not do them again unless your health care provider says that you can. °Get help right away if: °· You develop sudden, severe back pain. If this happens, stop doing the exercises right away. Do not do them again unless your health care provider says that you can. °This information is not intended to replace advice given to you by your health care provider. Make sure you discuss any questions you have with your health care provider. °Document Revised: 05/19/2018 Document Reviewed: 10/14/2017 °Elsevier Patient Education © 2020 [employer] ° °

## 2020-02-02 NOTE — Telephone Encounter (Signed)
Submitted for VOB. Obtained PA for Orthovisc thru [employer]. 02/02/2020

## 2020-02-13 NOTE — Telephone Encounter (Signed)
Please call to schedule Visco knee injections.  Authorized for Orthovisc series left knee. Buy and [employer] Deductible does not apply. PA for Orthovisc thru [employer]. Auth# [id number]  Effective 02/02/2020- 08/19/2020 Once OOP is met, insurance covers 100% of allowable cost. (as of date $35.00 has been met of $3300. ) Patient has a $35.00 copay each visit.

## 2020-02-15 NOTE — Telephone Encounter (Signed)
I LMOM for patient to call for benefits, and to schedule Visco knee injections.

## 2020-02-16 ENCOUNTER — Other Ambulatory Visit: Payer: Self-pay | Admitting: Physician Assistant

## 2020-02-16 DIAGNOSIS — M810 Age-related osteoporosis without current pathological fracture: Secondary | ICD-10-CM

## 2020-02-16 NOTE — Telephone Encounter (Signed)
Last Visit: 02/01/2020 Next Visit: 08/01/2020  Current Dose per office note on 02/01/2020, medications not mentioned Dx: Fibromyalgia and Trapezius muscle spasm   Okay to refill Baclofen and Lidoderm Patch?

## 2020-02-19 NOTE — Telephone Encounter (Signed)
Linda [name] called patient, patient wants to continue to take Baclofen 1 tablet at bedtime. Note on 02/01/2020 does not indicate for patient to d/c Baclofen. Please advise.

## 2020-02-19 NOTE — Telephone Encounter (Signed)
Please clarify if the patient is taking baclofen?  Dr. Estanislado Pandy discontinued the prescription on 02/01/20.   Ok to refill lidocaine patches.

## 2020-02-20 ENCOUNTER — Telehealth: Payer: Self-pay | Admitting: *Deleted

## 2020-02-20 DIAGNOSIS — I10 Essential (primary) hypertension: Secondary | ICD-10-CM | POA: Diagnosis not present

## 2020-02-20 DIAGNOSIS — E1169 Type 2 diabetes mellitus with other specified complication: Secondary | ICD-10-CM | POA: Diagnosis not present

## 2020-02-20 DIAGNOSIS — G4700 Insomnia, unspecified: Secondary | ICD-10-CM | POA: Diagnosis not present

## 2020-02-20 DIAGNOSIS — M130 Polyarthritis, unspecified: Secondary | ICD-10-CM | POA: Diagnosis not present

## 2020-02-20 DIAGNOSIS — E785 Hyperlipidemia, unspecified: Secondary | ICD-10-CM | POA: Diagnosis not present

## 2020-02-20 NOTE — Telephone Encounter (Signed)
Received notification from [facility] regarding a prior authorization for Lidocaine patches. Authorization has been APPROVED from 02/20/2020 to 01/25/2021.

## 2020-02-20 NOTE — Telephone Encounter (Signed)
Submitted a Prior Authorization request to [employer] for Lidocaine Patches  via Cover My Meds. Will update once we receive a response.      

## 2020-02-22 ENCOUNTER — Other Ambulatory Visit: Payer: Self-pay | Admitting: Family

## 2020-02-22 DIAGNOSIS — M5136 Other intervertebral disc degeneration, lumbar region: Secondary | ICD-10-CM | POA: Diagnosis not present

## 2020-02-22 DIAGNOSIS — M542 Cervicalgia: Secondary | ICD-10-CM | POA: Diagnosis not present

## 2020-02-22 DIAGNOSIS — M4722 Other spondylosis with radiculopathy, cervical region: Secondary | ICD-10-CM | POA: Diagnosis not present

## 2020-02-26 DIAGNOSIS — M4722 Other spondylosis with radiculopathy, cervical region: Secondary | ICD-10-CM | POA: Diagnosis not present

## 2020-02-29 ENCOUNTER — Other Ambulatory Visit: Payer: Self-pay | Admitting: Neurosurgery

## 2020-02-29 DIAGNOSIS — M4722 Other spondylosis with radiculopathy, cervical region: Secondary | ICD-10-CM

## 2020-03-13 ENCOUNTER — Other Ambulatory Visit: Payer: Self-pay | Admitting: Rheumatology

## 2020-03-14 NOTE — Telephone Encounter (Signed)
Last Visit: 02/01/2020 Next Visit: 08/01/2020  Current Dose per office note on 02/01/2020, not mentioned  Dx: Acute pain of left knee   Last Fill: 01/08/2020  Okay to refill Voltaren Gel?

## 2020-03-18 ENCOUNTER — Ambulatory Visit
Admission: RE | Admit: 2020-03-18 | Discharge: 2020-03-18 | Disposition: A | Discharge status: Home / Self Care | Payer: Medicare PPO | Source: Ambulatory Visit | Attending: Neurosurgery | Admitting: Neurosurgery

## 2020-03-18 ENCOUNTER — Other Ambulatory Visit: Payer: Self-pay

## 2020-03-18 DIAGNOSIS — M47812 Spondylosis without myelopathy or radiculopathy, cervical region: Secondary | ICD-10-CM | POA: Diagnosis not present

## 2020-03-18 DIAGNOSIS — M4802 Spinal stenosis, cervical region: Secondary | ICD-10-CM | POA: Diagnosis not present

## 2020-03-18 DIAGNOSIS — R531 Weakness: Secondary | ICD-10-CM | POA: Diagnosis not present

## 2020-03-18 DIAGNOSIS — M4722 Other spondylosis with radiculopathy, cervical region: Secondary | ICD-10-CM

## 2020-03-18 DIAGNOSIS — M50221 Other cervical disc displacement at C4-C5 level: Secondary | ICD-10-CM | POA: Diagnosis not present

## 2020-03-28 DIAGNOSIS — R030 Elevated blood-pressure reading, without diagnosis of hypertension: Secondary | ICD-10-CM | POA: Diagnosis not present

## 2020-03-28 DIAGNOSIS — M4722 Other spondylosis with radiculopathy, cervical region: Secondary | ICD-10-CM | POA: Diagnosis not present

## 2020-04-15 ENCOUNTER — Encounter: Payer: Self-pay | Admitting: Physical Therapy

## 2020-04-15 ENCOUNTER — Ambulatory Visit: Payer: Medicare PPO | Attending: Neurosurgery | Admitting: Physical Therapy

## 2020-04-15 ENCOUNTER — Other Ambulatory Visit: Payer: Self-pay | Admitting: Rheumatology

## 2020-04-15 ENCOUNTER — Other Ambulatory Visit: Payer: Self-pay

## 2020-04-15 DIAGNOSIS — R293 Abnormal posture: Secondary | ICD-10-CM | POA: Diagnosis not present

## 2020-04-15 DIAGNOSIS — M542 Cervicalgia: Secondary | ICD-10-CM | POA: Diagnosis not present

## 2020-04-15 DIAGNOSIS — M62838 Other muscle spasm: Secondary | ICD-10-CM | POA: Diagnosis not present

## 2020-04-15 NOTE — Therapy (Signed)
[facility] [address]  [place], [place], [address] Phone: [phone]   Fax:  [phone]  Physical Therapy Evaluation  Patient Details  Name: Linda [name] MRN: [id number] Date of Birth: 1950/05/07 Referring Provider (PT): Ashok Pall, MD   Encounter Date: 04/15/2020   PT End of Session - 04/15/20 1524    Visit Number 1    Number of Visits 13    Date for PT Re-Evaluation 05/27/20    Authorization Type [employer]    PT Start Time 6063    PT Stop Time 1516    PT Time Calculation (min) 33 min    Activity Tolerance Patient tolerated treatment well;Patient limited by pain    Behavior During Therapy [facility] for tasks assessed/performed           Past Medical History:  Diagnosis Date  . Allergic rhinitis   . Arthritis   . Breast cancer ([place]) 2002   left  . Diabetes ([place])   . Fibromyalgia   . Glaucoma   . Goals of care, counseling/discussion 12/18/2019  . Hypercholesteremia   . Hyperlipidemia   . Personal history of chemotherapy 05/2000   left breast  . Personal history of radiation therapy 2002   Letf breast  . Stage II infiltrating ductal breast carcinoma, ER-, left ([place]) 12/18/2019    Past Surgical History:  Procedure Laterality Date  . APPENDECTOMY  1989  . BREAST BIOPSY Left 04/26/2000  . BREAST LUMPECTOMY Left 05/14/2000   left  . CHOLECYSTECTOMY  1989  . KNEE ARTHROSCOPY Right   . NECK SURGERY  06/01/2019  . SHOULDER SURGERY Left   . VAGINAL HYSTERECTOMY  1987    There were no vitals filed for this visit.    Subjective Assessment - 04/15/20 1444    Subjective Patient reports neck and R shoulder pain since August 2021. Reports gradual onset without specific trigger. Hx of neck fusion in May 2021 which gave her relief for 3 months. Pain is located over the R UT with radiation down the R arm and into the R shoulder blade. Denies N/T. Pain worse with lifting, repetitive motions, bad weather,  holding her head down for too long. Better with pain meds. Reports HAs at least once a day in the posterior head with radiation to the crown of the head.    Pertinent History breast CA w/ radiation, chemo, L breast lumpectomy 2002, HLD, fibromyalgia, DM, R knee arthroscopy, C4-6 ACDF 2021, L shoulder surgery    Limitations Sitting;Reading;Lifting;House hold activities    Diagnostic tests 03/18/20 cervical MRI: Sequela of C4-6 ACDF. Moderate left and mild right C5-6 neural foraminal narrowing.    Patient Stated Goals decrease pain    Currently in Pain? Yes    Pain Score 4     Pain Location Neck    Pain Orientation Right    Pain Descriptors / Indicators Dull    Pain Type Chronic pain    Pain Radiating Towards R scapula              [facility] PT Assessment - 04/15/20 1449      Assessment   Medical Diagnosis other spondylosis with radiculopathy, cervical region    Referring Provider (PT) Ashok Pall, MD    Onset Date/Surgical Date 08/27/19    Hand Dominance Right    Next MD Visit 05/09/20    Prior Therapy yes      Precautions   Precautions --   hx L breast  CA, fibromyalgia, hx cervical fusion- no traction     Balance Screen   Has the patient fallen in the past 6 months No    Has the patient had a decrease in activity level because of a fear of falling?  No    Is the patient reluctant to leave their home because of a fear of falling?  No      Home Ecologist residence    Living Arrangements Spouse/significant other;Children    Available Help at Discharge Family    Type of Home --   townhome     Prior Function   Level of [place] Retired    Leisure puzzles, read      Cognition   Overall Cognitive Status Within Functional Limits for tasks assessed      Sensation   Light Touch Appears Intact      Coordination   Gross Motor Movements are Fluid and Coordinated Yes      Posture/Postural Control   Posture/Postural  Control Postural limitations    Postural Limitations Rounded Shoulders    Posture Comments head turned slightly to the L; decreased cervical lordosis      ROM / Strength   AROM / PROM / Strength AROM;Strength      AROM   AROM Assessment Site Cervical    Cervical Flexion 18   mild pain   Cervical Extension 22   mild pain   Cervical - Right Side Bend 20    Cervical - Left Side Bend 21   pain   Cervical - Right Rotation 29    Cervical - Left Rotation 39      Strength   Strength Assessment Site Shoulder;Elbow;Wrist;Hand    Right/Left Shoulder Right;Left    Right Shoulder Flexion 4/5    Right Shoulder ABduction 4+/5    Right Shoulder Internal Rotation 4+/5    Right Shoulder External Rotation 4/5    Left Shoulder Flexion 4/5    Left Shoulder ABduction 4+/5    Left Shoulder Internal Rotation 4+/5    Left Shoulder External Rotation 4+/5    Right/Left Elbow Right;Left    Right Elbow Flexion 4+/5    Right Elbow Extension 4/5    Left Elbow Flexion 4+/5    Left Elbow Extension 4+/5    Right/Left Wrist Right;Left    Right Wrist Flexion 4/5    Right Wrist Extension 4+/5    Left Wrist Flexion 4+/5    Left Wrist Extension 4+/5    Right/Left hand Right;Left    Right Hand Grip (lbs) 14.67   18, 16, 10   Left Hand Grip (lbs) 25   26, 24, 25     Palpation   Palpation comment TTP throughout B cervical and shoulder musculature; trigger pts evident in B infraspinatus, R rhomboids, B proximal biceps tendons and pec; increased tone in R SCM                      Objective measurements completed on examination: See above findings.               PT Education - 04/15/20 1523    Education Details prognosis, POC, HEP- advised to perform to tolerance    Person(s) Educated Patient    Methods Explanation;Demonstration;Tactile cues;Verbal cues;Handout    Comprehension Verbalized understanding;Returned demonstration            PT Short Term Goals - 04/15/20 8182  PT SHORT TERM GOAL #1   Title Patient to be independent with initial HEP.    Time 3    Period Weeks    Status New    Target Date 05/06/20             PT Long Term Goals - 04/15/20 1618      PT LONG TERM GOAL #1   Title Patient to be independent with advanced HEP.    Time 6    Period Weeks    Status New    Target Date 05/27/20      PT LONG TERM GOAL #2   Title Patient to demonstrate R UE strength >/=4+/5 and grip strength >/=20lbs.    Time 6    Period Weeks    Status New    Target Date 05/27/20      PT LONG TERM GOAL #3   Title Patient to demonstrate cervical AROM WFL and without pain limiting.    Time 6    Period Weeks    Status New    Target Date 05/27/20      PT LONG TERM GOAL #4   Title Patient to report 75% improvement in frequency/severity of HAs.    Time 6    Period Weeks    Status New    Target Date 05/27/20      PT LONG TERM GOAL #5   Title Patient to report 80% improvement in neck pain.    Time 6    Period Weeks    Status New    Target Date 05/27/20                  Plan - 04/15/20 1524    Clinical Impression Statement Patient is a 70 y/o F presenting to OPPT with c/o chronic insidious R sided neck pain with radiation down the R arm above the elbow since August 2021. Pain occurs in the R UT with radiation down the R arm and in the R interscapular region. Denies N/T. Worse with lifting, repetitive motions, bad weather, prolonged neck flexion. Also reports daily HAs in the posterior head with radiation to the crown of the head. Patient today presenting with rounded shoulders, decreased cervical lordosis, and slight L head turn, decreased and painful cervical AROM, decreased R UE and grip strength, TTP throughout B cervical and shoulder musculature with trigger pts evident in B infraspinatus, R rhomboids, B proximal biceps tendons and pec; increased tone in R SCM. Patient was educated on gentle stretching and postural correction HEP- patient reported  understanding. Would benefit from skilled PT services 2x/week for 6 weeks to address aforementioned impairments.    Personal Factors and Comorbidities Age;Comorbidity 3+;Past/Current Experience;Time since onset of injury/illness/exacerbation    Comorbidities breast CA w/ radiation, chemo, L breast lumpectomy 2002, HLD, fibromyalgia, DM, R knee arthroscopy, C4-6 ACDF 2021, L shoulder surgery    Examination-Activity Limitations Sleep;Bend;Carry;Dressing;Hygiene/Grooming;Lift;Reach Overhead    Examination-Participation Restrictions Cleaning;Community Activity;Shop;Driving;Laundry;Meal Prep    Stability/Clinical Decision Making Stable/Uncomplicated    Clinical Decision Making Low    Rehab Potential Good    PT Frequency 2x / week    PT Duration 6 weeks    PT Treatment/Interventions ADLs/Self Care Home Management;Cryotherapy;Electrical Stimulation;Moist Heat;Therapeutic exercise;Therapeutic activities;Functional mobility training;Ultrasound;Neuromuscular re-education;Manual techniques;Taping;Vasopneumatic Device;Energy conservation;Dry needling;Passive range of motion    PT Next Visit Plan cervical FOTO; reassess HEP, progress cervical mobility and postural strengthening to tolerance    Consulted and Agree with Plan of Care Patient  Patient will benefit from skilled therapeutic intervention in order to improve the following deficits and impairments:  Hypomobility,Decreased activity tolerance,Decreased strength,Increased fascial restricitons,Impaired UE functional use,Pain,Increased muscle spasms,Improper body mechanics,Decreased range of motion,Impaired flexibility,Postural dysfunction  Visit Diagnosis: Cervicalgia  Abnormal posture  Other muscle spasm     Problem List Patient Active Problem List   Diagnosis Date Noted  . Stage II infiltrating ductal breast carcinoma, ER-, left ([place]) 12/18/2019  . Goals of care, counseling/discussion 12/18/2019  . IDA (iron deficiency anemia)  12/19/2018  . Fatigue 12/10/2015  . Primary osteoarthritis of both hands 12/10/2015  . Low back pain without sciatica 12/10/2015  . Type 2 diabetes mellitus  12/10/2015  . Depression 12/10/2015  . Chronic pansinusitis 09/26/2015  . Genetic testing 01/11/2014  . Hives 11/11/2012  . Chest pain 04/23/2012  . Hyperlipidemia   . Chronic insomnia 01/29/2011  . Perennial allergic rhinitis 01/29/2011  . Fibromyalgia 01/29/2011     Janene Harvey, PT, DPT 04/15/20 4:26 PM   [facility] [address]  [place], [place], [address] Phone: [phone]   Fax:  [phone]  Name: Linda [name] MRN: [id number] Date of Birth: Aug 22, 1950

## 2020-04-15 NOTE — Telephone Encounter (Signed)
Next Visit: 08/01/2020  Last Visit: 02/01/2020  Last Fill:09/28/2019  Dx: Chronic insomnia   Current Dose per office note on 02/01/2020, trazodone 50 mg 1 tablet by mouth at bedtime for insomnia.   Okay to refill Trazadone?

## 2020-04-16 ENCOUNTER — Ambulatory Visit: Payer: Medicare PPO

## 2020-04-16 ENCOUNTER — Ambulatory Visit: Payer: Medicare PPO | Admitting: Physician Assistant

## 2020-04-23 ENCOUNTER — Encounter: Payer: Self-pay | Admitting: Physical Therapy

## 2020-04-23 ENCOUNTER — Ambulatory Visit: Payer: Medicare PPO | Admitting: Physical Therapy

## 2020-04-23 ENCOUNTER — Other Ambulatory Visit: Payer: Self-pay

## 2020-04-23 DIAGNOSIS — M62838 Other muscle spasm: Secondary | ICD-10-CM

## 2020-04-23 DIAGNOSIS — M542 Cervicalgia: Secondary | ICD-10-CM

## 2020-04-23 DIAGNOSIS — R293 Abnormal posture: Secondary | ICD-10-CM

## 2020-04-23 NOTE — Therapy (Signed)
[facility] [address]  [place], [place], [address] Phone: [phone]   Fax:  [phone]  Physical Therapy Treatment  Patient Details  Name: Linda [name] MRN: [id number] Date of Birth: Nov 03, 1950 Referring Provider (PT): Ashok Pall, MD   Encounter Date: 04/23/2020   PT End of Session - 04/23/20 1400    Visit Number 2    Number of Visits 13    Date for PT Re-Evaluation 05/27/20    Authorization Type [employer]    Authorization Time Period 3 visits approved from 03/22-05/02    Authorization - Visit Number 1    Authorization - Number of Visits 3    PT Start Time 7893   pt late   PT Stop Time 1358    PT Time Calculation (min) 34 min    Activity Tolerance Patient tolerated treatment well;Patient limited by pain    Behavior During Therapy [facility] for tasks assessed/performed           Past Medical History:  Diagnosis Date  . Allergic rhinitis   . Arthritis   . Breast cancer ([place]) 2002   left  . Diabetes ([place])   . Fibromyalgia   . Glaucoma   . Goals of care, counseling/discussion 12/18/2019  . Hypercholesteremia   . Hyperlipidemia   . Personal history of chemotherapy 05/2000   left breast  . Personal history of radiation therapy 2002   Letf breast  . Stage II infiltrating ductal breast carcinoma, ER-, left ([place]) 12/18/2019    Past Surgical History:  Procedure Laterality Date  . APPENDECTOMY  1989  . BREAST BIOPSY Left 04/26/2000  . BREAST LUMPECTOMY Left 05/14/2000   left  . CHOLECYSTECTOMY  1989  . KNEE ARTHROSCOPY Right   . NECK SURGERY  06/01/2019  . SHOULDER SURGERY Left   . VAGINAL HYSTERECTOMY  1987    There were no vitals filed for this visit.   Subjective Assessment - 04/23/20 1323    Subjective Things are going "slowly." Trying to build up to holding 30 sec with her stretches. Does report improvement in HAs. Denies questions/concerns on HEP.    Pertinent History breast CA w/  radiation, chemo, L breast lumpectomy 2002, HLD, fibromyalgia, DM, R knee arthroscopy, C4-6 ACDF 2021, L shoulder surgery    Diagnostic tests 03/18/20 cervical MRI: Sequela of C4-6 ACDF. Moderate left and mild right C5-6 neural foraminal narrowing.    Patient Stated Goals decrease pain    Currently in Pain? Yes    Pain Score 5     Pain Location Neck    Pain Orientation Right    Pain Descriptors / Indicators Dull;Sharp    Pain Type Chronic pain    Pain Radiating Towards R scapula                             [facility] Adult PT Treatment/Exercise - 04/23/20 0001      Self-Care   Self-Care Other Self-Care Comments    Other Self-Care Comments  self-STM to R with ball on wall and theracane rhomboids for improvement in pain at home      Exercises   Exercises Neck      Neck Exercises: Machines for Strengthening   UBE (Upper Arm Bike) L 2.0 3x min forward/3.0 back      Neck Exercises: Standing   Other Standing Exercises red TB row 10x   cueing to correct increased lumbar lordosis  Neck Exercises: Seated   Neck Retraction 5 reps;3 secs    Neck Retraction Limitations good form; additional 5x3" with yellow TB    Cervical Rotation Right;Left;5 reps    Cervical Rotation Limitations AROM   to tolerance; increased ROM to R   Other Seated Exercise R/L cervical rotation SNAG to tolerance 5x   heavy assistance for hand positioning   Other Seated Exercise cervical extension SNAG to tolerance 10x   cues to move hands & head in unison     Neck Exercises: Stretches   Other Neck Stretches R/L SCM stretch 15" each   cues to adjust form                 PT Education - 04/23/20 1400    Education Details update to HEP    Person(s) Educated Patient    Methods Explanation;Demonstration;Tactile cues;Verbal cues;Handout    Comprehension Verbalized understanding;Returned demonstration            PT Short Term Goals - 04/23/20 1402      PT SHORT TERM GOAL #1   Title  Patient to be independent with initial HEP.    Time 3    Period Weeks    Status On-going    Target Date 05/06/20             PT Long Term Goals - 04/23/20 1402      PT LONG TERM GOAL #1   Title Patient to be independent with advanced HEP.    Time 6    Period Weeks    Status On-going      PT LONG TERM GOAL #2   Title Patient to demonstrate R UE strength >/=4+/5 and grip strength >/=20lbs.    Time 6    Period Weeks    Status On-going      PT LONG TERM GOAL #3   Title Patient to demonstrate cervical AROM WFL and without pain limiting.    Time 6    Period Weeks    Status On-going      PT LONG TERM GOAL #4   Title Patient to report 75% improvement in frequency/severity of HAs.    Time 6    Period Weeks    Status On-going      PT LONG TERM GOAL #5   Title Patient to report 80% improvement in neck pain.    Time 6    Period Weeks    Status On-going                 Plan - 04/23/20 1400    Clinical Impression Statement Patient arrived to session with report of improvement in HAs with the help of her HEP. Denies questions/concerns. Reviewed gentle cervical stretching with cueing to adjust alignment and form. Patient with limited tolerance for stretching, thus allowed patient to hold for shorter durations of time to tolerance. Increased resistance with cervical retraction with good tolerance, but patient quick to fatigue. Cervical AROM was also progressed with SNAGs, with patient requiring heavy assistance for hand positioning. Initiated periscapular strengthening with cueing needed to address increased lumbar lordosis. Patient reported understanding of HEP update and declined modalities at end of session. No complaints upon leaving.    Comorbidities breast CA w/ radiation, chemo, L breast lumpectomy 2002, HLD, fibromyalgia, DM, R knee arthroscopy, C4-6 ACDF 2021, L shoulder surgery    PT Treatment/Interventions ADLs/Self Care Home Management;Cryotherapy;Electrical  Stimulation;Moist Heat;Therapeutic exercise;Therapeutic activities;Functional mobility training;Ultrasound;Neuromuscular re-education;Manual techniques;Taping;Vasopneumatic Device;Energy conservation;Dry needling;Passive range of motion  PT Next Visit Plan cervical FOTO; progress cervical mobility and postural strengthening to tolerance    Consulted and Agree with Plan of Care Patient           Patient will benefit from skilled therapeutic intervention in order to improve the following deficits and impairments:  Hypomobility,Decreased activity tolerance,Decreased strength,Increased fascial restricitons,Impaired UE functional use,Pain,Increased muscle spasms,Improper body mechanics,Decreased range of motion,Impaired flexibility,Postural dysfunction  Visit Diagnosis: Cervicalgia  Abnormal posture  Other muscle spasm     Problem List Patient Active Problem List   Diagnosis Date Noted  . Stage II infiltrating ductal breast carcinoma, ER-, left ([place]) 12/18/2019  . Goals of care, counseling/discussion 12/18/2019  . IDA (iron deficiency anemia) 12/19/2018  . Fatigue 12/10/2015  . Primary osteoarthritis of both hands 12/10/2015  . Low back pain without sciatica 12/10/2015  . Type 2 diabetes mellitus  12/10/2015  . Depression 12/10/2015  . Chronic pansinusitis 09/26/2015  . Genetic testing 01/11/2014  . Hives 11/11/2012  . Chest pain 04/23/2012  . Hyperlipidemia   . Chronic insomnia 01/29/2011  . Perennial allergic rhinitis 01/29/2011  . Fibromyalgia 01/29/2011     Janene Harvey, PT, DPT 04/23/20 2:04 PM   [facility] [address]  [place], [place], [address] Phone: [phone]   Fax:  [phone]  Name: Linda [name] MRN: [id number] Date of Birth: [birth year]

## 2020-04-25 ENCOUNTER — Ambulatory Visit: Payer: Medicare PPO | Admitting: Physical Therapy

## 2020-04-25 ENCOUNTER — Encounter: Payer: Self-pay | Admitting: Physical Therapy

## 2020-04-25 ENCOUNTER — Other Ambulatory Visit: Payer: Self-pay

## 2020-04-25 DIAGNOSIS — R293 Abnormal posture: Secondary | ICD-10-CM | POA: Diagnosis not present

## 2020-04-25 DIAGNOSIS — M542 Cervicalgia: Secondary | ICD-10-CM | POA: Diagnosis not present

## 2020-04-25 DIAGNOSIS — M62838 Other muscle spasm: Secondary | ICD-10-CM

## 2020-04-25 NOTE — Therapy (Signed)
[facility] [address]  [place], [place], [address] Phone: [phone]   Fax:  [phone]  Physical Therapy Treatment  Patient Details  Name: Linda [name] MRN: [id number] Date of Birth: 1950-06-22 Referring Provider (PT): Ashok Pall, MD   Encounter Date: 04/25/2020   PT End of Session - 04/25/20 1410    Visit Number 3    Number of Visits 13    Date for PT Re-Evaluation 05/27/20    Authorization Type [employer]    Authorization Time Period 3 visits approved from 03/22-05/02    Authorization - Visit Number 2    Authorization - Number of Visits 3    PT Start Time 6222    PT Stop Time 1414    PT Time Calculation (min) 51 min    Activity Tolerance Patient tolerated treatment well;Patient limited by pain    Behavior During Therapy [facility] for tasks assessed/performed           Past Medical History:  Diagnosis Date  . Allergic rhinitis   . Arthritis   . Breast cancer ([place]) 2002   left  . Diabetes ([place])   . Fibromyalgia   . Glaucoma   . Goals of care, counseling/discussion 12/18/2019  . Hypercholesteremia   . Hyperlipidemia   . Personal history of chemotherapy 05/2000   left breast  . Personal history of radiation therapy 2002   Letf breast  . Stage II infiltrating ductal breast carcinoma, ER-, left ([place]) 12/18/2019    Past Surgical History:  Procedure Laterality Date  . APPENDECTOMY  1989  . BREAST BIOPSY Left 04/26/2000  . BREAST LUMPECTOMY Left 05/14/2000   left  . CHOLECYSTECTOMY  1989  . KNEE ARTHROSCOPY Right   . NECK SURGERY  06/01/2019  . SHOULDER SURGERY Left   . VAGINAL HYSTERECTOMY  1987    There were no vitals filed for this visit.   Subjective Assessment - 04/25/20 1323    Subjective Reports that he fibromyalgia is acting up d/t the weather.    Pertinent History breast CA w/ radiation, chemo, L breast lumpectomy 2002, HLD, fibromyalgia, DM, R knee arthroscopy, C4-6 ACDF 2021, L  shoulder surgery    Diagnostic tests 03/18/20 cervical MRI: Sequela of C4-6 ACDF. Moderate left and mild right C5-6 neural foraminal narrowing.    Patient Stated Goals decrease pain    Currently in Pain? Yes    Pain Score 6     Pain Location Neck    Pain Orientation Right    Pain Descriptors / Indicators Dull;Sharp    Pain Type Chronic pain    Pain Radiating Towards shoulder              [facility] PT Assessment - 04/25/20 0001      Observation/Other Assessments   Focus on Therapeutic Outcomes (FOTO)  Cervical: 44                         [facility] Adult PT Treatment/Exercise - 04/25/20 0001      Neck Exercises: Machines for Strengthening   UBE (Upper Arm Bike) L 1.0 1 min forward/1 min back      Modalities   Modalities Moist Heat;Electrical Stimulation      Moist Heat Therapy   Number Minutes Moist Heat 10 Minutes   discontinued 1 minute early d/t weight of hot pack   Moist Heat Location Cervical      Electrical Stimulation   Electrical  Stimulation Location B UT    Electrical Stimulation Action IFC    Electrical Stimulation Parameters output to tolerance; 10 min    Electrical Stimulation Goals Pain      Manual Therapy   Manual Therapy Soft tissue mobilization;Myofascial release    Manual therapy comments sitting    Soft tissue mobilization STM to R UT, LS, scalenes, rhomboids    Myofascial Release manual TPR to R scalenes, LS, rhomboids; B suboccipital release   palpable painful trigger points evident, worst in scalenes     Neck Exercises: Stretches   Neural Stretch R suboccipital stretch with gentle OP 2x15"    Other Neck Stretches R scalene stretch 3x15"                    PT Short Term Goals - 04/25/20 1413      PT SHORT TERM GOAL #1   Title Patient to be independent with initial HEP.    Time 3    Period Weeks    Status Achieved    Target Date 05/06/20             PT Long Term Goals - 04/23/20 1402      PT LONG TERM GOAL #1    Title Patient to be independent with advanced HEP.    Time 6    Period Weeks    Status On-going      PT LONG TERM GOAL #2   Title Patient to demonstrate R UE strength >/=4+/5 and grip strength >/=20lbs.    Time 6    Period Weeks    Status On-going      PT LONG TERM GOAL #3   Title Patient to demonstrate cervical AROM WFL and without pain limiting.    Time 6    Period Weeks    Status On-going      PT LONG TERM GOAL #4   Title Patient to report 75% improvement in frequency/severity of HAs.    Time 6    Period Weeks    Status On-going      PT LONG TERM GOAL #5   Title Patient to report 80% improvement in neck pain.    Time 6    Period Weeks    Status On-going                 Plan - 04/25/20 1410    Clinical Impression Statement Patient arrived to session with report of increased pain d/t her Fibromyalgia; requesting decreased intensity session today. Addressed soft tissue restriction, trigger points, and pain with STM, manual TPR, and suboccipital release. Patient with palpable and painful trigger points in the R LS and scalenes. Tolerated MT well. Proceeded with cervical stretching to target tightness in the R suboccipitals and scalenes, which patient was able to tolerate for limited duration. Ended session with moist heat and e-stim to B UT for pain relief. Patient reported feeling "so-so" at end of session; no further complaints.    Comorbidities breast CA w/ radiation, chemo, L breast lumpectomy 2002, HLD, fibromyalgia, DM, R knee arthroscopy, C4-6 ACDF 2021, L shoulder surgery    PT Treatment/Interventions ADLs/Self Care Home Management;Cryotherapy;Electrical Stimulation;Moist Heat;Therapeutic exercise;Therapeutic activities;Functional mobility training;Ultrasound;Neuromuscular re-education;Manual techniques;Taping;Vasopneumatic Device;Energy conservation;Dry needling;Passive range of motion    PT Next Visit Plan progress cervical mobility and postural strengthening to  tolerance    Consulted and Agree with Plan of Care Patient           Patient will benefit from skilled therapeutic  intervention in order to improve the following deficits and impairments:  Hypomobility,Decreased activity tolerance,Decreased strength,Increased fascial restricitons,Impaired UE functional use,Pain,Increased muscle spasms,Improper body mechanics,Decreased range of motion,Impaired flexibility,Postural dysfunction  Visit Diagnosis: Cervicalgia  Abnormal posture  Other muscle spasm     Problem List Patient Active Problem List   Diagnosis Date Noted  . Stage II infiltrating ductal breast carcinoma, ER-, left ([place]) 12/18/2019  . Goals of care, counseling/discussion 12/18/2019  . IDA (iron deficiency anemia) 12/19/2018  . Fatigue 12/10/2015  . Primary osteoarthritis of both hands 12/10/2015  . Low back pain without sciatica 12/10/2015  . Type 2 diabetes mellitus  12/10/2015  . Depression 12/10/2015  . Chronic pansinusitis 09/26/2015  . Genetic testing 01/11/2014  . Hives 11/11/2012  . Chest pain 04/23/2012  . Hyperlipidemia   . Chronic insomnia 01/29/2011  . Perennial allergic rhinitis 01/29/2011  . Fibromyalgia 01/29/2011     Janene Harvey, PT, DPT 04/25/20 2:18 PM   [facility] [address]  [place], [place], [address] Phone: [phone]   Fax:  [phone]  Name: Linda [name] MRN: [id number] Date of Birth: January 08, 1951

## 2020-04-30 ENCOUNTER — Ambulatory Visit: Payer: Medicare PPO

## 2020-05-02 ENCOUNTER — Ambulatory Visit: Payer: Medicare PPO | Attending: Neurosurgery | Admitting: Physical Therapy

## 2020-05-02 ENCOUNTER — Other Ambulatory Visit: Payer: Self-pay

## 2020-05-02 ENCOUNTER — Encounter: Payer: Self-pay | Admitting: Physical Therapy

## 2020-05-02 DIAGNOSIS — M542 Cervicalgia: Secondary | ICD-10-CM | POA: Insufficient documentation

## 2020-05-02 DIAGNOSIS — R293 Abnormal posture: Secondary | ICD-10-CM | POA: Diagnosis not present

## 2020-05-02 DIAGNOSIS — M62838 Other muscle spasm: Secondary | ICD-10-CM | POA: Diagnosis not present

## 2020-05-02 NOTE — Therapy (Signed)
[facility] [address]  [place], [place], [address] Phone: [phone]   Fax:  [phone]  Physical Therapy Treatment  Patient Details  Name: Linda [name] MRN: [id number] Date of Birth: 1950-08-10 Referring Provider (PT): Ashok Pall, MD   Encounter Date: 05/02/2020   PT End of Session - 05/02/20 1351    Visit Number 4    Number of Visits 13    Date for PT Re-Evaluation 05/27/20    Authorization Type [employer]    Authorization Time Period 3 visits approved from 03/22-05/02, 10 visits approved 04/05-05/02    Authorization - Visit Number 3    Authorization - Number of Visits 13    PT Start Time 1311    PT Stop Time 6962    PT Time Calculation (min) 38 min    Activity Tolerance Patient tolerated treatment well;Patient limited by pain    Behavior During Therapy [facility] for tasks assessed/performed           Past Medical History:  Diagnosis Date  . Allergic rhinitis   . Arthritis   . Breast cancer ([place]) 2002   left  . Diabetes ([place])   . Fibromyalgia   . Glaucoma   . Goals of care, counseling/discussion 12/18/2019  . Hypercholesteremia   . Hyperlipidemia   . Personal history of chemotherapy 05/2000   left breast  . Personal history of radiation therapy 2002   Letf breast  . Stage II infiltrating ductal breast carcinoma, ER-, left ([place]) 12/18/2019    Past Surgical History:  Procedure Laterality Date  . APPENDECTOMY  1989  . BREAST BIOPSY Left 04/26/2000  . BREAST LUMPECTOMY Left 05/14/2000   left  . CHOLECYSTECTOMY  1989  . KNEE ARTHROSCOPY Right   . NECK SURGERY  06/01/2019  . SHOULDER SURGERY Left   . VAGINAL HYSTERECTOMY  1987    There were no vitals filed for this visit.   Subjective Assessment - 05/02/20 1313    Subjective Has been having muscle spasms since the last session- attributes this to massage.    Pertinent History breast CA w/ radiation, chemo, L breast lumpectomy 2002, HLD,  fibromyalgia, DM, R knee arthroscopy, C4-6 ACDF 2021, L shoulder surgery    Diagnostic tests 03/18/20 cervical MRI: Sequela of C4-6 ACDF. Moderate left and mild right C5-6 neural foraminal narrowing.    Patient Stated Goals decrease pain    Currently in Pain? Yes    Pain Score 6     Pain Location Neck    Pain Orientation Right    Pain Descriptors / Indicators Dull;Sharp    Pain Type Chronic pain                             [facility] Adult PT Treatment/Exercise - 05/02/20 0001      Exercises   Exercises Hand      Neck Exercises: Machines for Strengthening   UBE (Upper Arm Bike) L 1.0 1 min forward/1 min back      Neck Exercises: Standing   Other Standing Exercises B shoulder extension with yellow TB x10   cues to avoid shoulder elevation   Other Standing Exercises red TB row 2x10   cues to maintain posterior pelvic tilt     Neck Exercises: Seated   Neck Retraction 10 reps;3 secs    Neck Retraction Limitations 2x10; yellow TB   cues to avoid straining   Cervical Rotation  Right;Left;5 reps    Cervical Rotation Limitations SNAG to tolerance   cues to maintain torso still   Money 10 reps    Money Limitations yellow TB    Other Seated Exercise B shoulder horizontal ABD with yellow TB 2x5   cues to maintain arms at 90deg   Other Seated Exercise cervical extension SNAG to tolerance 10x      Hand Exercises   Other Hand Exercises R hand grip strengthening with red putty- full handing grip, 3 pt pinch, lateral pinch      Neck Exercises: Stretches   Upper Trapezius Stretch Right;Left;1 rep;30 seconds    Levator Stretch Right;Left;1 rep;30 seconds                  PT Education - 05/02/20 1350    Education Details update to HEP; administered red putty    Person(s) Educated Patient    Methods Explanation;Demonstration;Tactile cues;Verbal cues;Handout    Comprehension Verbalized understanding;Returned demonstration            PT Short Term Goals - 04/25/20  1413      PT SHORT TERM GOAL #1   Title Patient to be independent with initial HEP.    Time 3    Period Weeks    Status Achieved    Target Date 05/06/20             PT Long Term Goals - 04/23/20 1402      PT LONG TERM GOAL #1   Title Patient to be independent with advanced HEP.    Time 6    Period Weeks    Status On-going      PT LONG TERM GOAL #2   Title Patient to demonstrate R UE strength >/=4+/5 and grip strength >/=20lbs.    Time 6    Period Weeks    Status On-going      PT LONG TERM GOAL #3   Title Patient to demonstrate cervical AROM WFL and without pain limiting.    Time 6    Period Weeks    Status On-going      PT LONG TERM GOAL #4   Title Patient to report 75% improvement in frequency/severity of HAs.    Time 6    Period Weeks    Status On-going      PT LONG TERM GOAL #5   Title Patient to report 80% improvement in neck pain.    Time 6    Period Weeks    Status On-going                 Plan - 05/02/20 1352    Clinical Impression Statement Patient arrived to session with report of increased muscle spasms since the last session- attributes this to massage. Reports that she "could tolerate" e-stim but feels somewhat indifferent to it. Worked on gentle cervical stretching within shorter durations for max comfort/tolerance. Patient performed standing periscapular strengthening with cueing to maintain slight posterior pelvic tilt d/t increased lordotic curve. Sitting periscapular strengthening was performed with good overall form; more fatigue evident with horizontal abduction. Offered intermittent breaks to relieve fatigue. Initiated grip strengthening with putty which patient tolerated well- updated these exercises into HEP. Patient noted no pain with today's ther-ex and actually reported feeling "a little better" after performing her exercises. Plan to progress per patient's tolerance.    Comorbidities breast CA w/ radiation, chemo, L breast lumpectomy  2002, HLD, fibromyalgia, DM, R knee arthroscopy, C4-6 ACDF 2021, L shoulder surgery  PT Treatment/Interventions ADLs/Self Care Home Management;Cryotherapy;Electrical Stimulation;Moist Heat;Therapeutic exercise;Therapeutic activities;Functional mobility training;Ultrasound;Neuromuscular re-education;Manual techniques;Taping;Vasopneumatic Device;Energy conservation;Dry needling;Passive range of motion    PT Next Visit Plan progress cervical mobility and postural strengthening to tolerance    Consulted and Agree with Plan of Care Patient           Patient will benefit from skilled therapeutic intervention in order to improve the following deficits and impairments:  Hypomobility,Decreased activity tolerance,Decreased strength,Increased fascial restricitons,Impaired UE functional use,Pain,Increased muscle spasms,Improper body mechanics,Decreased range of motion,Impaired flexibility,Postural dysfunction  Visit Diagnosis: Cervicalgia  Abnormal posture  Other muscle spasm     Problem List Patient Active Problem List   Diagnosis Date Noted  . Stage II infiltrating ductal breast carcinoma, ER-, left ([place]) 12/18/2019  . Goals of care, counseling/discussion 12/18/2019  . IDA (iron deficiency anemia) 12/19/2018  . Fatigue 12/10/2015  . Primary osteoarthritis of both hands 12/10/2015  . Low back pain without sciatica 12/10/2015  . Type 2 diabetes mellitus  12/10/2015  . Depression 12/10/2015  . Chronic pansinusitis 09/26/2015  . Genetic testing 01/11/2014  . Hives 11/11/2012  . Chest pain 04/23/2012  . Hyperlipidemia   . Chronic insomnia 01/29/2011  . Perennial allergic rhinitis 01/29/2011  . Fibromyalgia 01/29/2011     Janene Harvey, PT, DPT 05/02/20 1:53 PM   [facility] [address]  [place], [place], [address] Phone: [phone]   Fax:  [phone]  Name: Aurianna Earlywine MRN: [id number] Date of  Birth: 27-Oct-1950

## 2020-05-07 ENCOUNTER — Ambulatory Visit: Payer: Medicare PPO

## 2020-05-07 ENCOUNTER — Other Ambulatory Visit: Payer: Self-pay

## 2020-05-07 DIAGNOSIS — M4722 Other spondylosis with radiculopathy, cervical region: Secondary | ICD-10-CM | POA: Diagnosis not present

## 2020-05-07 DIAGNOSIS — M542 Cervicalgia: Secondary | ICD-10-CM

## 2020-05-07 DIAGNOSIS — M62838 Other muscle spasm: Secondary | ICD-10-CM

## 2020-05-07 DIAGNOSIS — M5136 Other intervertebral disc degeneration, lumbar region: Secondary | ICD-10-CM | POA: Diagnosis not present

## 2020-05-07 DIAGNOSIS — R293 Abnormal posture: Secondary | ICD-10-CM | POA: Diagnosis not present

## 2020-05-07 NOTE — Therapy (Signed)
[facility] [address]  [address] [place], [place], [address] Phone: [phone]   Fax:  [phone]  Physical Therapy Treatment  Patient Details  Name: Linda [name] MRN: [id number] Date of Birth: April 14, 1950 Referring Provider (PT): Coletta Memos, MD   Encounter Date: 05/07/2020   PT End of Session - 05/07/20 1354    Visit Number 5    Number of Visits 13    Date for PT Re-Evaluation 05/27/20    Authorization Type [employer]    Authorization Time Period 3 visits approved from 03/22-05/02, 10 visits approved 04/05-05/02    Authorization - Visit Number 4    Authorization - Number of Visits 13    PT Start Time 1317    PT Stop Time 1351   pt had appointment at 2:30   PT Time Calculation (min) 34 min    Activity Tolerance Patient tolerated treatment well    Behavior During Therapy [facility] for tasks assessed/performed           Past Medical History:  Diagnosis Date  . Allergic rhinitis   . Arthritis   . Breast cancer ([facility]) 2002   left  . Diabetes ([facility])   . Fibromyalgia   . Glaucoma   . Goals of care, counseling/discussion 12/18/2019  . Hypercholesteremia   . Hyperlipidemia   . Personal history of chemotherapy 05/2000   left breast  . Personal history of radiation therapy 2002   Letf breast  . Stage II infiltrating ductal breast carcinoma, ER-, left ([facility]) 12/18/2019    Past Surgical History:  Procedure Laterality Date  . APPENDECTOMY  1989  . BREAST BIOPSY Left 04/26/2000  . BREAST LUMPECTOMY Left 05/14/2000   left  . CHOLECYSTECTOMY  1989  . KNEE ARTHROSCOPY Right   . NECK SURGERY  06/01/2019  . SHOULDER SURGERY Left   . VAGINAL HYSTERECTOMY  1987    There were no vitals filed for this visit.   Subjective Assessment - 05/07/20 1321    Subjective Pt thinks her neck is getting better, still some burning in the neck after some exercises but overall better.    Pertinent History breast CA w/ radiation, chemo,  L breast lumpectomy 2002, HLD, fibromyalgia, DM, R knee arthroscopy, C4-6 ACDF 2021, L shoulder surgery    Diagnostic tests 03/18/20 cervical MRI: Sequela of C4-6 ACDF. Moderate left and mild right C5-6 neural foraminal narrowing.    Patient Stated Goals decrease pain    Currently in Pain? Yes    Pain Score 4     Pain Location Neck    Pain Orientation Right    Pain Descriptors / Indicators Dull;Sharp    Pain Type Chronic pain              [facility] PT Assessment - 05/07/20 0001      AROM   Cervical Flexion 25% limited    Cervical Extension 50% limited    Cervical - Right Side Bend 25% limited    Cervical - Left Side Bend 50% limited    Cervical - Right Rotation 25% limited    Cervical - Left Rotation 50% limited      Strength   Right Shoulder Flexion 4/5    Right Shoulder ABduction 4+/5    Right Shoulder External Rotation 4/5   slight pain   Left Shoulder Flexion 4+/5    Left Shoulder ABduction 4+/5    Right Elbow Flexion 4+/5    Right Elbow Extension 4/5  Right Wrist Flexion 4+/5    Right Wrist Extension 4+/5    Right Hand Grip (lbs) 22                         [facility] Adult PT Treatment/Exercise - 05/07/20 0001      Exercises   Exercises Neck      Neck Exercises: Machines for Strengthening   UBE (Upper Arm Bike) L 1.0 2 min forward/ 2 min back      Neck Exercises: Standing   Other Standing Exercises shoulder Ts and Ys with back against wall 10 reps each      Neck Exercises: Stretches   Upper Trapezius Stretch Right;Left;2 reps;30 seconds                    PT Short Term Goals - 04/25/20 1413      PT SHORT TERM GOAL #1   Title Patient to be independent with initial HEP.    Time 3    Period Weeks    Status Achieved    Target Date 05/06/20             PT Long Term Goals - 05/07/20 1351      PT LONG TERM GOAL #1   Title Patient to be independent with advanced HEP.    Time 6    Period Weeks    Status On-going      PT LONG  TERM GOAL #2   Title Patient to demonstrate R UE strength >/=4+/5 and grip strength >/=20lbs.    Time 6    Period Weeks    Status Partially Met   R grip strength 22 lbs and R shoulder flexion and ER need improvement     PT LONG TERM GOAL #3   Title Patient to demonstrate cervical AROM WFL and without pain limiting.    Time 6    Period Weeks    Status On-going   still mostly limited with extenion, L side bend and rotation     PT LONG TERM GOAL #4   Title Patient to report 75% improvement in frequency/severity of HAs.    Time 6    Period Weeks    Status On-going      PT LONG TERM GOAL #5   Title Patient to report 80% improvement in neck pain.    Time 6    Period Weeks    Status On-going                 Plan - 05/07/20 1355    Clinical Impression Statement Pt is showing improvements in R grip strength, R shoulder and elbow still needs improving to reach 4+/5. Limitations still in L cervical side bend, rotation, and extension. Pt had no c/o pain with any cervical motions but increased muscle tension. She notes that she has much improved since her first session with cervical ROM and mobility. Pt had to shorten session today d/t having an appointment right after. No complaints during session and completed exercises with no reports of pain showing good form and min need for cueing. She will continue to benefit from skilled PT to address deficits in cervical ROM and R UE strength to increase functional performance.    Personal Factors and Comorbidities Age;Comorbidity 3+;Past/Current Experience;Time since onset of injury/illness/exacerbation    Comorbidities breast CA w/ radiation, chemo, L breast lumpectomy 2002, HLD, fibromyalgia, DM, R knee arthroscopy, C4-6 ACDF 2021, L shoulder surgery  PT Frequency 2x / week    PT Duration 6 weeks    PT Treatment/Interventions ADLs/Self Care Home Management;Cryotherapy;Electrical Stimulation;Moist Heat;Therapeutic exercise;Therapeutic  activities;Functional mobility training;Ultrasound;Neuromuscular re-education;Manual techniques;Taping;Vasopneumatic Device;Energy conservation;Dry needling;Passive range of motion    PT Next Visit Plan progress cervical mobility and postural strengthening to tolerance    Consulted and Agree with Plan of Care Patient           Patient will benefit from skilled therapeutic intervention in order to improve the following deficits and impairments:  Hypomobility,Decreased activity tolerance,Decreased strength,Increased fascial restricitons,Impaired UE functional use,Pain,Increased muscle spasms,Improper body mechanics,Decreased range of motion,Impaired flexibility,Postural dysfunction  Visit Diagnosis: Cervicalgia  Abnormal posture  Other muscle spasm     Problem List Patient Active Problem List   Diagnosis Date Noted  . Stage II infiltrating ductal breast carcinoma, ER-, left ([place]) 12/18/2019  . Goals of care, counseling/discussion 12/18/2019  . IDA (iron deficiency anemia) 12/19/2018  . Fatigue 12/10/2015  . Primary osteoarthritis of both hands 12/10/2015  . Low back pain without sciatica 12/10/2015  . Type 2 diabetes mellitus  12/10/2015  . Depression 12/10/2015  . Chronic pansinusitis 09/26/2015  . Genetic testing 01/11/2014  . Hives 11/11/2012  . Chest pain 04/23/2012  . Hyperlipidemia   . Chronic insomnia 01/29/2011  . Perennial allergic rhinitis 01/29/2011  . Fibromyalgia 01/29/2011    Artist Pais, PTA 05/07/2020, 6:19 PM  [facility] [address]  [place], [place], [address] Phone: [phone]   Fax:  [phone]  Name: Linda [name] MRN: [id number] Date of Birth: December 20, 1950

## 2020-05-09 DIAGNOSIS — M4722 Other spondylosis with radiculopathy, cervical region: Secondary | ICD-10-CM | POA: Diagnosis not present

## 2020-05-10 ENCOUNTER — Other Ambulatory Visit: Payer: Self-pay

## 2020-05-10 ENCOUNTER — Encounter: Payer: Self-pay | Admitting: Physical Therapy

## 2020-05-10 ENCOUNTER — Ambulatory Visit: Payer: Medicare PPO | Admitting: Physical Therapy

## 2020-05-10 DIAGNOSIS — M542 Cervicalgia: Secondary | ICD-10-CM

## 2020-05-10 DIAGNOSIS — R293 Abnormal posture: Secondary | ICD-10-CM

## 2020-05-10 DIAGNOSIS — M62838 Other muscle spasm: Secondary | ICD-10-CM | POA: Diagnosis not present

## 2020-05-10 NOTE — Therapy (Signed)
[facility] [address]  [place], [place], [address] Phone: [phone]   Fax:  [phone]  Physical Therapy Treatment  Patient Details  Name: Linda [name] MRN: [id number] Date of Birth: 1950-10-20 Referring Provider (PT): Ashok Pall, MD   Encounter Date: 05/10/2020   PT End of Session - 05/10/20 1149    Visit Number 6    Number of Visits 13    Date for PT Re-Evaluation 05/27/20    Authorization Type [employer]    Authorization Time Period 3 visits approved from 03/22-05/02, 10 visits approved 04/05-05/02    Authorization - Visit Number 5    Authorization - Number of Visits 13    PT Start Time 1104    PT Stop Time 1142    PT Time Calculation (min) 38 min    Activity Tolerance Patient tolerated treatment well;Patient limited by pain    Behavior During Therapy [facility] for tasks assessed/performed           Past Medical History:  Diagnosis Date  . Allergic rhinitis   . Arthritis   . Breast cancer ([place]) 2002   left  . Diabetes ([place])   . Fibromyalgia   . Glaucoma   . Goals of care, counseling/discussion 12/18/2019  . Hypercholesteremia   . Hyperlipidemia   . Personal history of chemotherapy 05/2000   left breast  . Personal history of radiation therapy 2002   Letf breast  . Stage II infiltrating ductal breast carcinoma, ER-, left ([place]) 12/18/2019    Past Surgical History:  Procedure Laterality Date  . APPENDECTOMY  1989  . BREAST BIOPSY Left 04/26/2000  . BREAST LUMPECTOMY Left 05/14/2000   left  . CHOLECYSTECTOMY  1989  . KNEE ARTHROSCOPY Right   . NECK SURGERY  06/01/2019  . SHOULDER SURGERY Left   . VAGINAL HYSTERECTOMY  1987    There were no vitals filed for this visit.   Subjective Assessment - 05/10/20 1108    Subjective Doing pretty well today. Having less pain in her shoulder blade.    Pertinent History breast CA w/ radiation, chemo, L breast lumpectomy 2002, HLD, fibromyalgia, DM,  R knee arthroscopy, C4-6 ACDF 2021, L shoulder surgery    Diagnostic tests 03/18/20 cervical MRI: Sequela of C4-6 ACDF. Moderate left and mild right C5-6 neural foraminal narrowing.    Patient Stated Goals decrease pain    Currently in Pain? Yes    Pain Score 3     Pain Location Knee    Pain Orientation Right    Pain Descriptors / Indicators Dull;Sharp    Pain Type Chronic pain                             [facility] Adult PT Treatment/Exercise - 05/10/20 0001      Exercises   Exercises Elbow      Elbow Exercises   Elbow Flexion Strengthening;Both;10 reps;Standing;Theraband    Theraband Level (Elbow Flexion) Level 2 (Red)    Elbow Flexion Limitations 2x10   muscle shaking     Neck Exercises: Machines for Strengthening   UBE (Upper Arm Bike) L 1.0 2 min forward/ 2 min back      Neck Exercises: Seated   Money 10 reps    Money Limitations 10x each yellow TB & red TB      Neck Exercises: Supine   Shoulder Flexion Both;5 reps    Shoulder Flexion Weights (  lbs) 1    Shoulder Flexion Limitations 2x5    Other Supine Exercise supine cervical extension over foam roll 10x3" to tolerance   c/o neck pain from pressure of foam roll     Neck Exercises: Prone   Other Prone Exercise L shoulder scaption leaning over counter x5   limited by LBP; unable to perform scaption lift offs from wall d/t pain     Neck Exercises: Stretches   Corner Stretch 2 reps;30 seconds   adjusted pec stretch to most tolerable position in corner and door way                 PT Education - 05/10/20 1147    Education Details update to HEP and removed exercises that were uncomfortable- cervical extension SNAG; advised to perform supine cervical extension with pillow or towel for improved tolerance    Person(s) Educated Patient    Methods Explanation;Demonstration;Tactile cues;Verbal cues;Handout    Comprehension Verbalized understanding;Returned demonstration            PT Short Term Goals  - 04/25/20 1413      PT SHORT TERM GOAL #1   Title Patient to be independent with initial HEP.    Time 3    Period Weeks    Status Achieved    Target Date 05/06/20             PT Long Term Goals - 05/07/20 1351      PT LONG TERM GOAL #1   Title Patient to be independent with advanced HEP.    Time 6    Period Weeks    Status On-going      PT LONG TERM GOAL #2   Title Patient to demonstrate R UE strength >/=4+/5 and grip strength >/=20lbs.    Time 6    Period Weeks    Status Partially Met   R grip strength 22 lbs and R shoulder flexion and ER need improvement     PT LONG TERM GOAL #3   Title Patient to demonstrate cervical AROM WFL and without pain limiting.    Time 6    Period Weeks    Status On-going   still mostly limited with extenion, L side bend and rotation     PT LONG TERM GOAL #4   Title Patient to report 75% improvement in frequency/severity of HAs.    Time 6    Period Weeks    Status On-going      PT LONG TERM GOAL #5   Title Patient to report 80% improvement in neck pain.    Time 6    Period Weeks    Status On-going                 Plan - 05/10/20 1149    Clinical Impression Statement Patient reporting improvement in "shoulder blade pain" today. Tolerated triceps and anterior shoulder strengthening to address remaining deficits revealed with objective measurements last session. Patient reporting fatigue and R shoulder discomfort with weighted shoulder flexion, but able to tolerate this activity within limited ROM. Tolerated shoulder ER strengthening with increased banded resistance. Initiated periscapular strengthening in scaption which was limited by shoulder and LBP, thus this was discontinued. Updated HEP- patient reported understanding. No complaints at end of session.    Personal Factors and Comorbidities Age;Comorbidity 3+;Past/Current Experience;Time since onset of injury/illness/exacerbation    Comorbidities breast CA w/ radiation, chemo, L  breast lumpectomy 2002, HLD, fibromyalgia, DM, R knee arthroscopy, C4-6 ACDF 2021, L  shoulder surgery    PT Frequency 2x / week    PT Duration 6 weeks    PT Treatment/Interventions ADLs/Self Care Home Management;Cryotherapy;Electrical Stimulation;Moist Heat;Therapeutic exercise;Therapeutic activities;Functional mobility training;Ultrasound;Neuromuscular re-education;Manual techniques;Taping;Vasopneumatic Device;Energy conservation;Dry needling;Passive range of motion    PT Next Visit Plan progress cervical mobility and postural strengthening to tolerance    Consulted and Agree with Plan of Care Patient           Patient will benefit from skilled therapeutic intervention in order to improve the following deficits and impairments:  Hypomobility,Decreased activity tolerance,Decreased strength,Increased fascial restricitons,Impaired UE functional use,Pain,Increased muscle spasms,Improper body mechanics,Decreased range of motion,Impaired flexibility,Postural dysfunction  Visit Diagnosis: Cervicalgia  Abnormal posture  Other muscle spasm     Problem List Patient Active Problem List   Diagnosis Date Noted  . Stage II infiltrating ductal breast carcinoma, ER-, left ([place]) 12/18/2019  . Goals of care, counseling/discussion 12/18/2019  . IDA (iron deficiency anemia) 12/19/2018  . Fatigue 12/10/2015  . Primary osteoarthritis of both hands 12/10/2015  . Low back pain without sciatica 12/10/2015  . Type 2 diabetes mellitus  12/10/2015  . Depression 12/10/2015  . Chronic pansinusitis 09/26/2015  . Genetic testing 01/11/2014  . Hives 11/11/2012  . Chest pain 04/23/2012  . Hyperlipidemia   . Chronic insomnia 01/29/2011  . Perennial allergic rhinitis 01/29/2011  . Fibromyalgia 01/29/2011    Janene Harvey, PT, DPT 05/10/20 11:53 AM   [facility] [address]  [place], [place], [address] Phone: [phone]   Fax:   [phone]  Name: Linda [name] MRN: [id number] Date of Birth: [birth year]

## 2020-05-14 ENCOUNTER — Other Ambulatory Visit: Payer: Self-pay

## 2020-05-14 ENCOUNTER — Other Ambulatory Visit: Payer: Self-pay | Admitting: Physician Assistant

## 2020-05-14 ENCOUNTER — Ambulatory Visit: Payer: Medicare PPO

## 2020-05-14 DIAGNOSIS — R293 Abnormal posture: Secondary | ICD-10-CM | POA: Diagnosis not present

## 2020-05-14 DIAGNOSIS — M62838 Other muscle spasm: Secondary | ICD-10-CM

## 2020-05-14 DIAGNOSIS — M542 Cervicalgia: Secondary | ICD-10-CM | POA: Diagnosis not present

## 2020-05-14 DIAGNOSIS — M810 Age-related osteoporosis without current pathological fracture: Secondary | ICD-10-CM

## 2020-05-14 NOTE — Therapy (Addendum)
[facility] [address]  [place], [place], [address] Phone: [phone]   Fax:  [phone]  Physical Therapy Treatment  Patient Details  Name: Linda [name] MRN: [id number] Date of Birth: 1950-02-20 Referring Provider (PT): Ashok Pall, MD   Progress Note Reporting Period 04/15/20 to 05/14/20  See note below for Objective Data and Assessment of Progress/Goals.     Encounter Date: 05/14/2020   PT End of Session - 05/14/20 1356    Visit Number 7    Number of Visits 13    Date for PT Re-Evaluation 05/27/20    Authorization Type [employer]    Authorization Time Period 3 visits approved from 03/22-05/02, 10 visits approved 04/05-05/02    Authorization - Visit Number 6    Authorization - Number of Visits 13    PT Start Time 5498    PT Stop Time 1357    PT Time Calculation (min) 42 min    Activity Tolerance Patient tolerated treatment well;Patient limited by pain    Behavior During Therapy [facility] for tasks assessed/performed           Past Medical History:  Diagnosis Date  . Allergic rhinitis   . Arthritis   . Breast cancer ([place]) 2002   left  . Diabetes ([place])   . Fibromyalgia   . Glaucoma   . Goals of care, counseling/discussion 12/18/2019  . Hypercholesteremia   . Hyperlipidemia   . Personal history of chemotherapy 05/2000   left breast  . Personal history of radiation therapy 2002   Letf breast  . Stage II infiltrating ductal breast carcinoma, ER-, left ([employer]) 12/18/2019    Past Surgical History:  Procedure Laterality Date  . APPENDECTOMY  1989  . BREAST BIOPSY Left 04/26/2000  . BREAST LUMPECTOMY Left 05/14/2000   left  . CHOLECYSTECTOMY  1989  . KNEE ARTHROSCOPY Right   . NECK SURGERY  06/01/2019  . SHOULDER SURGERY Left   . VAGINAL HYSTERECTOMY  1987    There were no vitals filed for this visit.   Subjective Assessment - 05/14/20 1318    Subjective Pt has been doing good. Less pain  in the shoulder blade area today.    Pertinent History breast CA w/ radiation, chemo, L breast lumpectomy 2002, HLD, fibromyalgia, DM, R knee arthroscopy, C4-6 ACDF 2021, L shoulder surgery    Diagnostic tests 03/18/20 cervical MRI: Sequela of C4-6 ACDF. Moderate left and mild right C5-6 neural foraminal narrowing.    Patient Stated Goals decrease pain    Currently in Pain? Yes    Pain Score 3     Pain Location Shoulder   knee and upper neck   Pain Orientation Right    Pain Descriptors / Indicators Dull;Sharp    Pain Type Chronic pain                             [facility] Adult PT Treatment/Exercise - 05/14/20 0001      Exercises   Exercises Elbow;Shoulder      Elbow Exercises   Elbow Flexion Strengthening;Both;20 reps;Seated    Theraband Level (Elbow Flexion) Level 2 (Red)    Elbow Flexion Limitations 2x10    Elbow Extension Strengthening;Both;10 reps;Standing    Theraband Level (Elbow Extension) Level 2 (Red)      Neck Exercises: Machines for Strengthening   Nustep L2x30mn   last 2 min UE only     Neck  Exercises: Supine   Neck Retraction 10 reps;5 secs    Shoulder Flexion Right;15 reps;Left    Shoulder Flexion Weights (lbs) 1    Other Supine Exercise supine cervical extension over foam roll 10x3" to tolerance      Neck Exercises: Sidelying   Other Sidelying Exercise R/L open book 5x3"      Shoulder Exercises: Standing   Horizontal ABduction Strengthening;Both;10 reps;Theraband    Theraband Level (Shoulder Horizontal ABduction) Level 1 (Yellow)    External Rotation Strengthening;Both;10 reps;Theraband    Theraband Level (Shoulder External Rotation) Level 2 (Red)    Flexion Strengthening;Both;10 reps;Weights    Shoulder Flexion Weight (lbs) 1    Extension Strengthening;Both;10 reps;Theraband    Theraband Level (Shoulder Extension) Level 2 (Red)    Row Strengthening;Both;10 reps;Theraband    Theraband Level (Shoulder Row) Level 2 (Red)      Neck  Exercises: Stretches   Upper Trapezius Stretch Right;Left;1 rep;30 seconds    Levator Stretch Right;Left;1 rep;30 seconds                    PT Short Term Goals - 04/25/20 1413      PT SHORT TERM GOAL #1   Title Patient to be independent with initial HEP.    Time 3    Period Weeks    Status Achieved    Target Date 05/06/20             PT Long Term Goals - 05/07/20 1351      PT LONG TERM GOAL #1   Title Patient to be independent with advanced HEP.    Time 6    Period Weeks    Status On-going      PT LONG TERM GOAL #2   Title Patient to demonstrate R UE strength >/=4+/5 and grip strength >/=20lbs.    Time 6    Period Weeks    Status Partially Met   R grip strength 22 lbs and R shoulder flexion and ER need improvement     PT LONG TERM GOAL #3   Title Patient to demonstrate cervical AROM WFL and without pain limiting.    Time 6    Period Weeks    Status On-going   still mostly limited with extenion, L side bend and rotation     PT LONG TERM GOAL #4   Title Patient to report 75% improvement in frequency/severity of HAs.    Time 6    Period Weeks    Status On-going      PT LONG TERM GOAL #5   Title Patient to report 80% improvement in neck pain.    Time 6    Period Weeks    Status On-going                 Plan - 05/14/20 1358    Clinical Impression Statement Pt reported improvement in pain in shoulder blade today. No complaints during session, except mild pain reported in front of shoulder during standing shld flexion. She demonstrated a good performance of the exercises with min need for cueing. Pt still showing weakness in R tricep along with ant deltoid. Pt had increased tolerance to cervical extension against foam roller in supine, she demonstrated good ROM and no pain.    Personal Factors and Comorbidities Age;Comorbidity 3+;Past/Current Experience;Time since onset of injury/illness/exacerbation    Comorbidities breast CA w/ radiation, chemo, L  breast lumpectomy 2002, HLD, fibromyalgia, DM, R knee arthroscopy, C4-6 ACDF 2021, L shoulder  surgery    PT Frequency 2x / week    PT Duration 6 weeks    PT Treatment/Interventions ADLs/Self Care Home Management;Cryotherapy;Electrical Stimulation;Moist Heat;Therapeutic exercise;Therapeutic activities;Functional mobility training;Ultrasound;Neuromuscular re-education;Manual techniques;Taping;Vasopneumatic Device;Energy conservation;Dry needling;Passive range of motion    PT Next Visit Plan progress cervical mobility and postural strengthening to tolerance    Consulted and Agree with Plan of Care Patient           Patient will benefit from skilled therapeutic intervention in order to improve the following deficits and impairments:  Hypomobility,Decreased activity tolerance,Decreased strength,Increased fascial restricitons,Impaired UE functional use,Pain,Increased muscle spasms,Improper body mechanics,Decreased range of motion,Impaired flexibility,Postural dysfunction  Visit Diagnosis: Cervicalgia  Abnormal posture  Other muscle spasm     Problem List Patient Active Problem List   Diagnosis Date Noted  . Stage II infiltrating ductal breast carcinoma, ER-, left ([place]) 12/18/2019  . Goals of care, counseling/discussion 12/18/2019  . IDA (iron deficiency anemia) 12/19/2018  . Fatigue 12/10/2015  . Primary osteoarthritis of both hands 12/10/2015  . Low back pain without sciatica 12/10/2015  . Type 2 diabetes mellitus  12/10/2015  . Depression 12/10/2015  . Chronic pansinusitis 09/26/2015  . Genetic testing 01/11/2014  . Hives 11/11/2012  . Chest pain 04/23/2012  . Hyperlipidemia   . Chronic insomnia 01/29/2011  . Perennial allergic rhinitis 01/29/2011  . Fibromyalgia 01/29/2011    Artist Pais, PTA 05/14/2020, 3:07 PM  [facility] [address]  [place], [place], [address] Phone: [phone]   Fax:   [phone]  Name: Linda [name] MRN: [id number] Date of Birth: 30-Dec-1950  PHYSICAL THERAPY DISCHARGE SUMMARY  Visits from Start of Care: 7  Current functional level related to goals / functional outcomes: Unable to assess; patient did not return   Remaining deficits: Unable to assess   Education / Equipment: HEP  Plan: Patient agrees to discharge.  Patient goals were partially met. Patient is being discharged due to not returning since the last visit.  ?????     Janene Harvey, PT, DPT 06/13/20 3:23 PM

## 2020-05-14 NOTE — Telephone Encounter (Signed)
Next Visit: 08/01/2020  Last Visit: 02/01/2020  Last Fill:02/19/2020  Dx:  Trapezius muscle spasm  Current Dose per office note on 02/01/2020, not mentioned  Okay to refill Baclofen?

## 2020-05-15 DIAGNOSIS — H401131 Primary open-angle glaucoma, bilateral, mild stage: Secondary | ICD-10-CM | POA: Diagnosis not present

## 2020-05-15 DIAGNOSIS — H2513 Age-related nuclear cataract, bilateral: Secondary | ICD-10-CM | POA: Diagnosis not present

## 2020-05-16 ENCOUNTER — Ambulatory Visit: Payer: Medicare PPO | Admitting: Physical Therapy

## 2020-05-20 DIAGNOSIS — E785 Hyperlipidemia, unspecified: Secondary | ICD-10-CM | POA: Diagnosis not present

## 2020-05-20 DIAGNOSIS — I10 Essential (primary) hypertension: Secondary | ICD-10-CM | POA: Diagnosis not present

## 2020-05-20 DIAGNOSIS — E1169 Type 2 diabetes mellitus with other specified complication: Secondary | ICD-10-CM | POA: Diagnosis not present

## 2020-05-20 DIAGNOSIS — M797 Fibromyalgia: Secondary | ICD-10-CM | POA: Diagnosis not present

## 2020-05-21 DIAGNOSIS — F064 Anxiety disorder due to known physiological condition: Secondary | ICD-10-CM | POA: Diagnosis not present

## 2020-05-21 DIAGNOSIS — I10 Essential (primary) hypertension: Secondary | ICD-10-CM | POA: Diagnosis not present

## 2020-05-21 DIAGNOSIS — M130 Polyarthritis, unspecified: Secondary | ICD-10-CM | POA: Diagnosis not present

## 2020-05-21 DIAGNOSIS — E1169 Type 2 diabetes mellitus with other specified complication: Secondary | ICD-10-CM | POA: Diagnosis not present

## 2020-06-10 ENCOUNTER — Other Ambulatory Visit: Payer: Self-pay | Admitting: Physician Assistant

## 2020-06-10 NOTE — Telephone Encounter (Signed)
Next Visit: 08/01/2020  Last Visit: 02/01/2020  Last Fill: 02/19/2020  Dx: Chronic lower back pain without sciatica  Current Dose per office note on 02/01/2020, not mentioned  Okay to refill Lidocaine patch?

## 2020-07-04 ENCOUNTER — Encounter: Payer: Self-pay | Admitting: Obstetrics

## 2020-07-04 ENCOUNTER — Ambulatory Visit (INDEPENDENT_AMBULATORY_CARE_PROVIDER_SITE_OTHER): Payer: Medicare PPO | Admitting: Obstetrics

## 2020-07-04 ENCOUNTER — Other Ambulatory Visit: Payer: Self-pay

## 2020-07-04 VITALS — BP 138/72 | HR 100 | Ht 62.0 in | Wt 132.0 lb

## 2020-07-04 DIAGNOSIS — Z853 Personal history of malignant neoplasm of breast: Secondary | ICD-10-CM

## 2020-07-04 DIAGNOSIS — Z9889 Other specified postprocedural states: Secondary | ICD-10-CM

## 2020-07-04 DIAGNOSIS — E119 Type 2 diabetes mellitus without complications: Secondary | ICD-10-CM

## 2020-07-04 DIAGNOSIS — Z01419 Encounter for gynecological examination (general) (routine) without abnormal findings: Secondary | ICD-10-CM

## 2020-07-04 DIAGNOSIS — Z90710 Acquired absence of both cervix and uterus: Secondary | ICD-10-CM | POA: Diagnosis not present

## 2020-07-04 NOTE — Progress Notes (Signed)
Subjective:        Linda [name] is a 70 y.o. female here for a routine exam.  Current complaints: None.    Personal health questionnaire:  Is patient Ashkenazi Jewish, have a family history of breast and/or ovarian cancer: no Is there a family history of uterine cancer diagnosed at age < 87, gastrointestinal cancer, urinary tract cancer, family member who is a Field seismologist syndrome-associated carrier: no Is the patient overweight and hypertensive, family history of diabetes, personal history of gestational diabetes, preeclampsia or PCOS: no Is patient over 66, have PCOS,  family history of premature CHD under age 9, diabetes, smoke, have hypertension or peripheral artery disease:  no At any time, has a partner hit, kicked or otherwise hurt or frightened you?: no Over the past 2 weeks, have you felt down, depressed or hopeless?: no Over the past 2 weeks, have you felt little interest or pleasure in doing things?:no   Gynecologic History No LMP recorded. Patient has had a hysterectomy. Contraception: status post hysterectomy Last Pap: 1987. Results were: normal Last mammogram: 01-04-2020. Results were: normal  Obstetric History OB History  Gravida Para Term Preterm AB Living  3 3 3     3   SAB IAB Ectopic Multiple Live Births          3    # Outcome Date GA Lbr Len/2nd Weight Sex Delivery Anes PTL Lv  3 Term 04/16/82    F    LIV  2 Term 04/17/79    F    LIV  1 Term 05/28/70    F    LIV    Past Medical History:  Diagnosis Date   Allergic rhinitis    Arthritis    Breast cancer ([place]) 2002   left   Diabetes ([place])    Fibromyalgia    Glaucoma    Goals of care, counseling/discussion 12/18/2019   Hypercholesteremia    Hyperlipidemia    Personal history of chemotherapy 05/2000   left breast   Personal history of radiation therapy 2002   Letf breast   Stage II infiltrating ductal breast carcinoma, ER-, left ([place]) 12/18/2019    Past Surgical History:  Procedure Laterality  Date   APPENDECTOMY  1989   BREAST BIOPSY Left 04/26/2000   BREAST LUMPECTOMY Left 05/14/2000   left   CHOLECYSTECTOMY  1989   KNEE ARTHROSCOPY Right    NECK SURGERY  06/01/2019   SHOULDER SURGERY Left    VAGINAL HYSTERECTOMY  1987     Current Outpatient Medications:    diclofenac Sodium (VOLTAREN) 1 % GEL, APPLY 2-4 GRAMS TO AFFECTED JOINT 4 TIMES DAILY AS NEEDED., Disp: 400 g, Rfl: 2   rosuvastatin (CRESTOR) 10 MG tablet, Take 10 mg by mouth daily., Disp: , Rfl:    baclofen (LIORESAL) 10 MG tablet, TAKE 1 TABLET BY MOUTH AT BEDTIME AS NEEDED FOR MUSCLE SPASMS., Disp: 90 tablet, Rfl: 0   buPROPion (WELLBUTRIN XL) 300 MG 24 hr tablet, Take 300 mg by mouth daily., Disp: , Rfl:    cyclobenzaprine (FLEXERIL) 10 MG tablet, Take 10 mg by mouth at bedtime., Disp: , Rfl:    cycloSPORINE (RESTASIS) 0.05 % ophthalmic emulsion, Place 1 drop into both eyes 2 (two) times daily., Disp: , Rfl:    HYDROcodone-acetaminophen (NORCO/VICODIN) 5-325 MG tablet, Take 1 tablet by mouth every 8 (eight) hours as needed. for pain, Disp: , Rfl: 0   JANUMET XR [phone] MG TB24, Take 1 tablet by mouth daily., Disp: ,  Rfl: 3   lidocaine (LIDODERM) 5 %, PLACE 1 PATCH ONTO THE SKIN DAILY. REMOVE & DISCARD PATCH WITHIN 12 HOURS OR AS DIRECTED BY MD, Disp: 30 patch, Rfl: 0   LUMIGAN 0.01 % SOLN, 1 DROP INTO BOTH EYES IN THE EVENING, Disp: , Rfl: 6   pregabalin (LYRICA) 75 MG capsule, Take 75 mg by mouth daily. (Patient not taking: Reported on 07/04/2020), Disp: , Rfl:    traZODone (DESYREL) 50 MG tablet, TAKE 1 TABLET BY MOUTH EVERYDAY AT BEDTIME, Disp: 90 tablet, Rfl: 0   Vitamin D, Ergocalciferol, (DRISDOL) 1.25 MG (50000 UNIT) CAPS capsule, TAKE 1 CAPSULE BY MOUTH ONE TIME PER WEEK, Disp: 12 capsule, Rfl: 4 Allergies  Allergen Reactions   Sulfa Antibiotics Rash    Social History   Tobacco Use   Smoking status: Former    Packs/day: 1.50    Years: 25.00    Pack years: 37.50    Types: Cigarettes    Start date:  04/20/1973    Quit date: 01/27/1988    Years since quitting: 32.4   Smokeless tobacco: Never  Substance Use Topics   Alcohol use: Yes    Alcohol/week: 0.0 standard drinks    Comment: rarely    Family History  Problem Relation Age of Onset   Throat cancer Father    Breast cancer Maternal 62    CAD Mother    Stroke Mother    Breast cancer Daughter       Review of Systems  Constitutional: negative for fatigue and weight loss Respiratory: negative for cough and wheezing Cardiovascular: negative for chest pain, fatigue and palpitations Gastrointestinal: negative for abdominal pain and change in bowel habits Musculoskeletal:negative for myalgias Neurological: negative for gait problems and tremors Behavioral/Psych: negative for abusive relationship, depression Endocrine: negative for temperature intolerance    Genitourinary:negative for abnormal menstrual periods, genital lesions, hot flashes, sexual problems and vaginal discharge Integument/breast: negative for breast lump, breast tenderness, nipple discharge and skin lesion(s)    Objective:       BP 138/72   Pulse 100   Ht 5\' 2"  (1.575 m)   Wt 132 lb (59.9 kg)   BMI 24.14 kg/m  General:   alert  Skin:   no rash or abnormalities  Lungs:   clear to auscultation bilaterally  Heart:   regular rate and rhythm, S1, S2 normal, no murmur, click, rub or gallop  Breasts:   normal without suspicious masses, skin or nipple changes or axillary nodes  Abdomen:  normal findings: no organomegaly, soft, non-tender and no hernia  Pelvis:  External genitalia: normal general appearance Urinary system: urethral meatus normal and bladder without fullness, nontender Vaginal: normal without tenderness, induration or masses Cervix: absent Adnexa: normal bimanual exam Uterus: absent   Lab Review Urine pregnancy test Labs reviewed yes Radiologic studies reviewed yes  I have spent a total of 20 minutes of face-to-face time, excluding  clinical staff time, reviewing notes and preparing to see patient, ordering tests and/or medications, and counseling the patient.   Assessment:     1. Encounter for routine gynecologic examination in Medicare patient - doing well  2. S/P vaginal hysterectomy  3. History of breast cancer - in remission  4. Status post breast lumpectomy-  5. Type 2 diabetes mellitus   - managed by PCP     Plan:    Education reviewed: calcium supplements, depression evaluation, low fat, low cholesterol diet, safe sex/STD prevention, self breast exams, and weight bearing exercise. Follow up  in: 3 years.     Shelly Bombard, MD 07/04/2020 3:16 PM

## 2020-07-04 NOTE — Progress Notes (Signed)
GYN presents for AEX.  Next Mammo 01/03/2022

## 2020-07-12 ENCOUNTER — Other Ambulatory Visit: Payer: Self-pay | Admitting: Rheumatology

## 2020-07-12 NOTE — Telephone Encounter (Signed)
Next Visit: 08/01/2020   Last Visit: 02/01/2020   Last Fill: 04/15/2020  Dx: Chronic insomnia    Current Dose per office note on 02/01/2020, trazodone 50 mg 1 tablet by mouth at bedtime for insomnia.    Okay to refill Trazadone?

## 2020-07-18 NOTE — Progress Notes (Deleted)
Office Visit Note  Patient: Linda [name]             Date of Birth: 1950/04/26           MRN: [id number]             PCP: Lucianne Lei, MD Referring: Lucianne Lei, MD Visit Date: 08/01/2020 Occupation: @GUAROCC @  Subjective:  No chief complaint on file.   History of Present Illness: Linda [name] is a 70 y.o. female ***   Activities of Daily Living:  Patient reports morning stiffness for *** {minute/hour:19697}.   Patient {ACTIONS;DENIES/REPORTS:21021675::"Denies"} nocturnal pain.  Difficulty dressing/grooming: {ACTIONS;DENIES/REPORTS:21021675::"Denies"} Difficulty climbing stairs: {ACTIONS;DENIES/REPORTS:21021675::"Denies"} Difficulty getting out of chair: {ACTIONS;DENIES/REPORTS:21021675::"Denies"} Difficulty using hands for taps, buttons, cutlery, and/or writing: {ACTIONS;DENIES/REPORTS:21021675::"Denies"}  No Rheumatology ROS completed.   PMFS History:  Patient Active Problem List   Diagnosis Date Noted   Stage II infiltrating ductal breast carcinoma, ER-, left ([place]) 12/18/2019   Goals of care, counseling/discussion 12/18/2019   IDA (iron deficiency anemia) 12/19/2018   Fatigue 12/10/2015   Primary osteoarthritis of both hands 12/10/2015   Low back pain without sciatica 12/10/2015   Type 2 diabetes mellitus  12/10/2015   Depression 12/10/2015   Chronic pansinusitis 09/26/2015   Genetic testing 01/11/2014   Hives 11/11/2012   Chest pain 04/23/2012   Hyperlipidemia    Chronic insomnia 01/29/2011   Perennial allergic rhinitis 01/29/2011   Fibromyalgia 01/29/2011    Past Medical History:  Diagnosis Date   Allergic rhinitis    Arthritis    Breast cancer ([place]) 2002   left   Diabetes ([place])    Fibromyalgia    Glaucoma    Goals of care, counseling/discussion 12/18/2019   Hypercholesteremia    Hyperlipidemia    Personal history of chemotherapy 05/2000   left breast   Personal history of radiation therapy 2002   Letf breast   Stage II infiltrating  ductal breast carcinoma, ER-, left ([place]) 12/18/2019    Family History  Problem Relation Age of Onset   Throat cancer Father    Breast cancer Maternal Aunt    CAD Mother    Stroke Mother    Breast cancer Daughter    Past Surgical History:  Procedure Laterality Date   APPENDECTOMY  1989   BREAST BIOPSY Left 04/26/2000   BREAST LUMPECTOMY Left 05/14/2000   left   CHOLECYSTECTOMY  1989   KNEE ARTHROSCOPY Right    NECK SURGERY  06/01/2019   SHOULDER SURGERY Left    VAGINAL HYSTERECTOMY  1987   Social History   Social History Narrative   Not on file   Immunization History  Administered Date(s) Administered   Influenza, High Dose Seasonal PF 10/28/2017   Influenza,inj,Quad PF,6+ Mos 12/17/2014   Influenza-Unspecified 11/12/2015   PFIZER(Purple Top)SARS-COV-2 Vaccination 02/18/2019, 03/11/2019   Pneumococcal Conjugate-13 07/20/2016, 10/28/2017     Objective: Vital Signs: There were no vitals taken for this visit.   Physical Exam   Musculoskeletal Exam:   CDAI Exam: CDAI Score: -- Patient Global: --; Provider Global: -- Swollen: --; Tender: -- Joint Exam 08/01/2020   No joint exam has been documented for this visit   There is currently no information documented on the homunculus. Go to the Rheumatology activity and complete the homunculus joint exam.  Investigation: No additional findings.  Imaging: No results found.  Recent Labs: Lab Results  Component Value Date   WBC 8.4 12/18/2019   HGB 12.0 12/18/2019   PLT 303 12/18/2019   NA 142  12/18/2019   K 4.2 12/18/2019   CL 103 12/18/2019   CO2 31 12/18/2019   GLUCOSE 97 12/18/2019   BUN 17 12/18/2019   CREATININE 0.80 12/18/2019   BILITOT 0.2 (L) 12/18/2019   ALKPHOS 65 12/18/2019   AST 13 (L) 12/18/2019   ALT 9 12/18/2019   PROT 7.4 12/18/2019   ALBUMIN 4.4 12/18/2019   CALCIUM 10.2 12/18/2019   GFRAA >60 12/16/2018    Speciality Comments: No specialty comments available.  Procedures:  No  procedures performed Allergies: Sulfa antibiotics   Assessment / Plan:     Visit Diagnoses: No diagnosis found.  Orders: No orders of the defined types were placed in this encounter.  No orders of the defined types were placed in this encounter.   Face-to-face time spent with patient was *** minutes. Greater than 50% of time was spent in counseling and coordination of care.  Follow-Up Instructions: No follow-ups on file.   Earnestine Mealing, CMA  Note - This record has been created using Editor, commissioning.  Chart creation errors have been sought, but may not always  have been located. Such creation errors do not reflect on  the standard of medical care.

## 2020-08-01 ENCOUNTER — Ambulatory Visit: Payer: Medicare PPO | Admitting: Physician Assistant

## 2020-08-01 DIAGNOSIS — R5383 Other fatigue: Secondary | ICD-10-CM

## 2020-08-01 DIAGNOSIS — M810 Age-related osteoporosis without current pathological fracture: Secondary | ICD-10-CM

## 2020-08-01 DIAGNOSIS — M170 Bilateral primary osteoarthritis of knee: Secondary | ICD-10-CM

## 2020-08-01 DIAGNOSIS — M7061 Trochanteric bursitis, right hip: Secondary | ICD-10-CM

## 2020-08-01 DIAGNOSIS — Z8659 Personal history of other mental and behavioral disorders: Secondary | ICD-10-CM

## 2020-08-01 DIAGNOSIS — M797 Fibromyalgia: Secondary | ICD-10-CM

## 2020-08-01 DIAGNOSIS — M62838 Other muscle spasm: Secondary | ICD-10-CM

## 2020-08-01 DIAGNOSIS — G8929 Other chronic pain: Secondary | ICD-10-CM

## 2020-08-01 DIAGNOSIS — Z8639 Personal history of other endocrine, nutritional and metabolic disease: Secondary | ICD-10-CM

## 2020-08-01 DIAGNOSIS — M19041 Primary osteoarthritis, right hand: Secondary | ICD-10-CM

## 2020-08-01 DIAGNOSIS — F5104 Psychophysiologic insomnia: Secondary | ICD-10-CM

## 2020-08-01 DIAGNOSIS — Z981 Arthrodesis status: Secondary | ICD-10-CM

## 2020-08-01 DIAGNOSIS — M25562 Pain in left knee: Secondary | ICD-10-CM

## 2020-08-01 NOTE — Progress Notes (Deleted)
Office Visit Note  Patient: Linda [name]             Date of Birth: 03/03/1950           MRN: [id number]             PCP: Lucianne Lei, MD Referring: Lucianne Lei, MD Visit Date: 08/15/2020 Occupation: @GUAROCC @  Subjective:    History of Present Illness: Meridian Scherger is a 70 y.o. female with history of fibromyalgia and osteoarthritis.  She had a left knee joint cortisone injection performed on 09/29/2018.  Activities of Daily Living:  Patient reports morning stiffness for *** {minute/hour:19697}.   Patient {ACTIONS;DENIES/REPORTS:21021675::"Denies"} nocturnal pain.  Difficulty dressing/grooming: {ACTIONS;DENIES/REPORTS:21021675::"Denies"} Difficulty climbing stairs: {ACTIONS;DENIES/REPORTS:21021675::"Denies"} Difficulty getting out of chair: {ACTIONS;DENIES/REPORTS:21021675::"Denies"} Difficulty using hands for taps, buttons, cutlery, and/or writing: {ACTIONS;DENIES/REPORTS:21021675::"Denies"}  No Rheumatology ROS completed.   PMFS History:  Patient Active Problem List   Diagnosis Date Noted   Stage II infiltrating ductal breast carcinoma, ER-, left ([place]) 12/18/2019   Goals of care, counseling/discussion 12/18/2019   IDA (iron deficiency anemia) 12/19/2018   Fatigue 12/10/2015   Primary osteoarthritis of both hands 12/10/2015   Low back pain without sciatica 12/10/2015   Type 2 diabetes mellitus  12/10/2015   Depression 12/10/2015   Chronic pansinusitis 09/26/2015   Genetic testing 01/11/2014   Hives 11/11/2012   Chest pain 04/23/2012   Hyperlipidemia    Chronic insomnia 01/29/2011   Perennial allergic rhinitis 01/29/2011   Fibromyalgia 01/29/2011    Past Medical History:  Diagnosis Date   Allergic rhinitis    Arthritis    Breast cancer ([place]) 2002   left   Diabetes ([place])    Fibromyalgia    Glaucoma    Goals of care, counseling/discussion 12/18/2019   Hypercholesteremia    Hyperlipidemia    Personal history of chemotherapy 05/2000   left breast    Personal history of radiation therapy 2002   Letf breast   Stage II infiltrating ductal breast carcinoma, ER-, left ([place]) 12/18/2019    Family History  Problem Relation Age of Onset   Throat cancer Father    Breast cancer Maternal Aunt    CAD Mother    Stroke Mother    Breast cancer Daughter    Past Surgical History:  Procedure Laterality Date   APPENDECTOMY  1989   BREAST BIOPSY Left 04/26/2000   BREAST LUMPECTOMY Left 05/14/2000   left   CHOLECYSTECTOMY  1989   KNEE ARTHROSCOPY Right    NECK SURGERY  06/01/2019   SHOULDER SURGERY Left    VAGINAL HYSTERECTOMY  1987   Social History   Social History Narrative   Not on file   Immunization History  Administered Date(s) Administered   Influenza, High Dose Seasonal PF 10/28/2017   Influenza,inj,Quad PF,6+ Mos 12/17/2014   Influenza-Unspecified 11/12/2015   PFIZER(Purple Top)SARS-COV-2 Vaccination 02/18/2019, 03/11/2019   Pneumococcal Conjugate-13 07/20/2016, 10/28/2017     Objective: Vital Signs: There were no vitals taken for this visit.   Physical Exam   Musculoskeletal Exam: ***  CDAI Exam: CDAI Score: -- Patient Global: --; Provider Global: -- Swollen: --; Tender: -- Joint Exam 08/15/2020   No joint exam has been documented for this visit   There is currently no information documented on the homunculus. Go to the Rheumatology activity and complete the homunculus joint exam.  Investigation: No additional findings.  Imaging: No results found.  Recent Labs: Lab Results  Component Value Date   WBC 8.4 12/18/2019   HGB  12.0 12/18/2019   PLT 303 12/18/2019   NA 142 12/18/2019   K 4.2 12/18/2019   CL 103 12/18/2019   CO2 31 12/18/2019   GLUCOSE 97 12/18/2019   BUN 17 12/18/2019   CREATININE 0.80 12/18/2019   BILITOT 0.2 (L) 12/18/2019   ALKPHOS 65 12/18/2019   AST 13 (L) 12/18/2019   ALT 9 12/18/2019   PROT 7.4 12/18/2019   ALBUMIN 4.4 12/18/2019   CALCIUM 10.2 12/18/2019   GFRAA >60  12/16/2018    Speciality Comments: No specialty comments available.  Procedures:  No procedures performed Allergies: Sulfa antibiotics   Assessment / Plan:     Visit Diagnoses: No diagnosis found.  Orders: No orders of the defined types were placed in this encounter.  No orders of the defined types were placed in this encounter.   Face-to-face time spent with patient was *** minutes. Greater than 50% of time was spent in counseling and coordination of care.  Follow-Up Instructions: No follow-ups on file.   Earnestine Mealing, CMA  Note - This record has been created using Editor, commissioning.  Chart creation errors have been sought, but may not always  have been located. Such creation errors do not reflect on  the standard of medical care.

## 2020-08-15 ENCOUNTER — Ambulatory Visit: Payer: Medicare PPO | Admitting: Physician Assistant

## 2020-08-15 DIAGNOSIS — M62838 Other muscle spasm: Secondary | ICD-10-CM

## 2020-08-15 DIAGNOSIS — M25562 Pain in left knee: Secondary | ICD-10-CM

## 2020-08-15 DIAGNOSIS — M170 Bilateral primary osteoarthritis of knee: Secondary | ICD-10-CM

## 2020-08-15 DIAGNOSIS — M7061 Trochanteric bursitis, right hip: Secondary | ICD-10-CM

## 2020-08-15 DIAGNOSIS — F5104 Psychophysiologic insomnia: Secondary | ICD-10-CM

## 2020-08-15 DIAGNOSIS — M19041 Primary osteoarthritis, right hand: Secondary | ICD-10-CM

## 2020-08-15 DIAGNOSIS — G8929 Other chronic pain: Secondary | ICD-10-CM

## 2020-08-15 DIAGNOSIS — Z981 Arthrodesis status: Secondary | ICD-10-CM

## 2020-08-15 DIAGNOSIS — R5383 Other fatigue: Secondary | ICD-10-CM

## 2020-08-15 DIAGNOSIS — Z8639 Personal history of other endocrine, nutritional and metabolic disease: Secondary | ICD-10-CM

## 2020-08-15 DIAGNOSIS — M797 Fibromyalgia: Secondary | ICD-10-CM

## 2020-08-15 DIAGNOSIS — Z8659 Personal history of other mental and behavioral disorders: Secondary | ICD-10-CM

## 2020-08-15 DIAGNOSIS — M810 Age-related osteoporosis without current pathological fracture: Secondary | ICD-10-CM

## 2020-08-19 DIAGNOSIS — M4722 Other spondylosis with radiculopathy, cervical region: Secondary | ICD-10-CM | POA: Diagnosis not present

## 2020-08-19 DIAGNOSIS — M5416 Radiculopathy, lumbar region: Secondary | ICD-10-CM | POA: Diagnosis not present

## 2020-08-19 DIAGNOSIS — M5136 Other intervertebral disc degeneration, lumbar region: Secondary | ICD-10-CM | POA: Diagnosis not present

## 2020-09-19 DIAGNOSIS — Z853 Personal history of malignant neoplasm of breast: Secondary | ICD-10-CM | POA: Diagnosis not present

## 2020-09-19 DIAGNOSIS — I10 Essential (primary) hypertension: Secondary | ICD-10-CM | POA: Diagnosis not present

## 2020-09-19 DIAGNOSIS — C50912 Malignant neoplasm of unspecified site of left female breast: Secondary | ICD-10-CM | POA: Diagnosis not present

## 2020-09-19 DIAGNOSIS — M130 Polyarthritis, unspecified: Secondary | ICD-10-CM | POA: Diagnosis not present

## 2020-09-19 DIAGNOSIS — F064 Anxiety disorder due to known physiological condition: Secondary | ICD-10-CM | POA: Diagnosis not present

## 2020-09-19 DIAGNOSIS — E1169 Type 2 diabetes mellitus with other specified complication: Secondary | ICD-10-CM | POA: Diagnosis not present

## 2020-09-19 DIAGNOSIS — E785 Hyperlipidemia, unspecified: Secondary | ICD-10-CM | POA: Diagnosis not present

## 2020-10-09 DIAGNOSIS — E785 Hyperlipidemia, unspecified: Secondary | ICD-10-CM | POA: Diagnosis not present

## 2020-10-09 DIAGNOSIS — I10 Essential (primary) hypertension: Secondary | ICD-10-CM | POA: Diagnosis not present

## 2020-10-09 DIAGNOSIS — E1169 Type 2 diabetes mellitus with other specified complication: Secondary | ICD-10-CM | POA: Diagnosis not present

## 2020-10-09 DIAGNOSIS — K5900 Constipation, unspecified: Secondary | ICD-10-CM | POA: Diagnosis not present

## 2020-10-15 DIAGNOSIS — M542 Cervicalgia: Secondary | ICD-10-CM | POA: Diagnosis not present

## 2020-10-15 DIAGNOSIS — M5136 Other intervertebral disc degeneration, lumbar region: Secondary | ICD-10-CM | POA: Diagnosis not present

## 2020-10-15 DIAGNOSIS — M5416 Radiculopathy, lumbar region: Secondary | ICD-10-CM | POA: Diagnosis not present

## 2020-12-03 ENCOUNTER — Telehealth: Payer: Self-pay

## 2020-12-03 NOTE — Telephone Encounter (Signed)
Chart review.

## 2020-12-16 ENCOUNTER — Other Ambulatory Visit: Payer: Self-pay

## 2020-12-16 ENCOUNTER — Inpatient Hospital Stay: Payer: Medicare PPO | Attending: Hematology & Oncology

## 2020-12-16 ENCOUNTER — Encounter: Payer: Self-pay | Admitting: Hematology & Oncology

## 2020-12-16 ENCOUNTER — Inpatient Hospital Stay: Payer: Medicare PPO | Admitting: Hematology & Oncology

## 2020-12-16 VITALS — BP 131/68 | HR 99 | Temp 98.5°F | Resp 18 | Wt 141.0 lb

## 2020-12-16 DIAGNOSIS — Z171 Estrogen receptor negative status [ER-]: Secondary | ICD-10-CM

## 2020-12-16 DIAGNOSIS — Z79899 Other long term (current) drug therapy: Secondary | ICD-10-CM | POA: Diagnosis not present

## 2020-12-16 DIAGNOSIS — C50912 Malignant neoplasm of unspecified site of left female breast: Secondary | ICD-10-CM | POA: Diagnosis not present

## 2020-12-16 DIAGNOSIS — D500 Iron deficiency anemia secondary to blood loss (chronic): Secondary | ICD-10-CM | POA: Diagnosis not present

## 2020-12-16 DIAGNOSIS — Z853 Personal history of malignant neoplasm of breast: Secondary | ICD-10-CM | POA: Diagnosis not present

## 2020-12-16 DIAGNOSIS — D509 Iron deficiency anemia, unspecified: Secondary | ICD-10-CM | POA: Insufficient documentation

## 2020-12-16 DIAGNOSIS — Z9221 Personal history of antineoplastic chemotherapy: Secondary | ICD-10-CM | POA: Diagnosis not present

## 2020-12-16 LAB — CMP (CANCER CENTER ONLY)
ALT: 11 U/L (ref 0–44)
AST: 18 U/L (ref 15–41)
Albumin: 4.4 g/dL (ref 3.5–5.0)
Alkaline Phosphatase: 55 U/L (ref 38–126)
Anion gap: 7 (ref 5–15)
BUN: 16 mg/dL (ref 8–23)
CO2: 30 mmol/L (ref 22–32)
Calcium: 9.9 mg/dL (ref 8.9–10.3)
Chloride: 103 mmol/L (ref 98–111)
Creatinine: 0.86 mg/dL (ref 0.44–1.00)
GFR, Estimated: >60 mL/min (ref >60)
Glucose, Bld: 181 mg/dL — ABNORMAL HIGH (ref 70–99)
Potassium: 4 mmol/L (ref 3.5–5.1)
Sodium: 140 mmol/L (ref 135–145)
Total Bilirubin: 0.3 mg/dL (ref 0.3–1.2)
Total Protein: 7.1 g/dL (ref 6.5–8.1)

## 2020-12-16 LAB — CBC WITH DIFFERENTIAL (CANCER CENTER ONLY)
Abs Immature Granulocytes: 0.01 10*3/uL (ref 0.00–0.07)
Basophils Absolute: 0.1 10*3/uL (ref 0.0–0.1)
Basophils Relative: 1 %
Eosinophils Absolute: 0.2 10*3/uL (ref 0.0–0.5)
Eosinophils Relative: 4 %
HCT: 36.8 % (ref 36.0–46.0)
Hemoglobin: 11.6 g/dL — ABNORMAL LOW (ref 12.0–15.0)
Immature Granulocytes: 0 %
Lymphocytes Relative: 32 %
Lymphs Abs: 1.8 10*3/uL (ref 0.7–4.0)
MCH: 27.4 pg (ref 26.0–34.0)
MCHC: 31.5 g/dL (ref 30.0–36.0)
MCV: 86.8 fL (ref 80.0–100.0)
Monocytes Absolute: 0.3 10*3/uL (ref 0.1–1.0)
Monocytes Relative: 5 %
Neutro Abs: 3.4 10*3/uL (ref 1.7–7.7)
Neutrophils Relative %: 58 %
Platelet Count: 254 10*3/uL (ref 150–400)
RBC: 4.24 MIL/uL (ref 3.87–5.11)
RDW: 13.7 % (ref 11.5–15.5)
WBC Count: 5.8 10*3/uL (ref 4.0–10.5)
nRBC: 0 % (ref 0.0–0.2)

## 2020-12-16 LAB — LACTATE DEHYDROGENASE: LDH: 184 U/L (ref 98–192)

## 2020-12-16 NOTE — Progress Notes (Signed)
Hematology and Oncology Follow Up Visit  Linda [name] [id number] 1950/10/25 70 y.o. 12/16/2020   Principle Diagnosis:  Stage IIA (T2 N0 M0) ductal carcinoma of the left breast triple-negative Iron deficiency anemia  Current Therapy:   IV iron as indicated-Venofer given on 12/2018   Interim History:  Linda [name] is here today for her annual follow-up.  She is doing pretty well right now.  She really has no specific complaints.  She did have very nice Thanksgiving with her daughter.  I think she is going go up to [place].  She has had no problems with bleeding or bruising.  Her appetite has been good.  I think she had COVID back in June.  She had a lot of congestion.  Her last iron studies done a year ago showed a ferritin of 151 with an iron saturation of 30%.  She has had no rashes.  There is been no leg swelling.  Her next mammogram can be in December.  Overall, I would say her performance status is probably ECOG 1.   Medications:  Allergies as of 12/16/2020       Reactions   Sulfa Antibiotics Rash, Hives        Medication List        Accurate as of December 16, 2020  3:16 PM. If you have any questions, ask your nurse or doctor.          baclofen 10 MG tablet Commonly known as: LIORESAL TAKE 1 TABLET BY MOUTH AT BEDTIME AS NEEDED FOR MUSCLE SPASMS.   buPROPion 300 MG 24 hr tablet Commonly known as: WELLBUTRIN XL Take 300 mg by mouth daily.   cyclobenzaprine 10 MG tablet Commonly known as: FLEXERIL Take 10 mg by mouth at bedtime.   cyclobenzaprine 10 MG tablet Commonly known as: FLEXERIL Take 1 tablet by mouth 3 (three) times daily as needed.   cycloSPORINE 0.05 % ophthalmic emulsion Commonly known as: RESTASIS Place 1 drop into both eyes 2 (two) times daily.   diclofenac Sodium 1 % Gel Commonly known as: VOLTAREN APPLY 2-4 GRAMS TO AFFECTED JOINT 4 TIMES DAILY AS NEEDED.   gabapentin 300 MG capsule Commonly known as: NEURONTIN Take 300 mg  by mouth at bedtime.   HYDROcodone-acetaminophen 5-325 MG tablet Commonly known as: NORCO/VICODIN Take 1 tablet by mouth every 8 (eight) hours as needed. for pain   Janumet XR [phone] MG Tb24 Generic drug: SitaGLIPtin-MetFORMIN HCl Take 1 tablet by mouth daily.   lidocaine 5 % Commonly known as: LIDODERM PLACE 1 PATCH ONTO THE SKIN DAILY. REMOVE & DISCARD PATCH WITHIN 12 HOURS OR AS DIRECTED BY MD   Lumigan 0.01 % Soln Generic drug: bimatoprost 1 DROP INTO BOTH EYES IN THE EVENING   pregabalin 75 MG capsule Commonly known as: LYRICA Take 75 mg by mouth daily.   rosuvastatin 10 MG tablet Commonly known as: CRESTOR Take 10 mg by mouth daily.   traZODone 50 MG tablet Commonly known as: DESYREL TAKE 1 TABLET BY MOUTH EVERYDAY AT BEDTIME   Vitamin D (Ergocalciferol) 1.25 MG (50000 UNIT) Caps capsule Commonly known as: DRISDOL TAKE 1 CAPSULE BY MOUTH ONE TIME PER WEEK        Allergies:  Allergies  Allergen Reactions   Sulfa Antibiotics Rash and Hives    Past Medical History, Surgical history, Social history, and Family History were reviewed and updated.  Review of Systems: Review of Systems  Constitutional: Negative.   HENT: Negative.    Eyes: Negative.  Respiratory: Negative.    Cardiovascular: Negative.   Gastrointestinal: Negative.   Genitourinary: Negative.   Musculoskeletal: Negative.   Skin: Negative.   Neurological: Negative.   Endo/Heme/Allergies: Negative.   Psychiatric/Behavioral: Negative.     Physical Exam:  weight is 141 lb (64 kg). Her oral temperature is 98.5 F (36.9 C). Her blood pressure is 131/68 and her pulse is 99. Her respiration is 18 and oxygen saturation is 100%.   Wt Readings from Last 3 Encounters:  12/16/20 141 lb (64 kg)  07/04/20 132 lb (59.9 kg)  02/01/20 131 lb (59.4 kg)    Physical Exam Vitals reviewed.  Constitutional:      Comments: On her breast exam, her right breast shows no masses, edema or erythema.  There  is no right axillary adenopathy.  Left breast is slightly contracted from past surgery and radiation.  She has a well-healed lumpectomy scar at the 2 o'clock position.  She has no distinct mass in the left breast.  There is no left axillary adenopathy.  HENT:     Head: Normocephalic and atraumatic.  Eyes:     Pupils: Pupils are equal, round, and reactive to light.  Cardiovascular:     Rate and Rhythm: Normal rate and regular rhythm.     Heart sounds: Normal heart sounds.  Pulmonary:     Effort: Pulmonary effort is normal.     Breath sounds: Normal breath sounds.  Abdominal:     General: Bowel sounds are normal.     Palpations: Abdomen is soft.  Musculoskeletal:        General: No tenderness or deformity. Normal range of motion.     Cervical back: Normal range of motion.  Lymphadenopathy:     Cervical: No cervical adenopathy.  Skin:    General: Skin is warm and dry.     Findings: No erythema or rash.  Neurological:     Mental Status: She is alert and oriented to person, place, and time.  Psychiatric:        Behavior: Behavior normal.        Thought Content: Thought content normal.        Judgment: Judgment normal.     Lab Results  Component Value Date   WBC 5.8 12/16/2020   HGB 11.6 (L) 12/16/2020   HCT 36.8 12/16/2020   MCV 86.8 12/16/2020   PLT 254 12/16/2020   Lab Results  Component Value Date   FERRITIN 151 12/18/2019   IRON 90 12/18/2019   TIBC 301 12/18/2019   UIBC 211 12/18/2019   IRONPCTSAT 30 12/18/2019   Lab Results  Component Value Date   RETICCTPCT 1.3 04/23/2012   RBC 4.24 12/16/2020   No results found for: KPAFRELGTCHN, LAMBDASER, KAPLAMBRATIO No results found for: IGGSERUM, IGA, IGMSERUM No results found for: Odetta Pink, SPEI   Chemistry      Component Value Date/Time   NA 140 12/16/2020 1403   NA 139 12/11/2016 1252   NA 138 12/13/2015 1352   K 4.0 12/16/2020 1403   K 3.9 12/11/2016  1252   K 4.1 12/13/2015 1352   CL 103 12/16/2020 1403   CL 104 12/11/2016 1252   CO2 30 12/16/2020 1403   CO2 28 12/11/2016 1252   CO2 23 12/13/2015 1352   BUN 16 12/16/2020 1403   BUN 14 12/11/2016 1252   BUN 17.2 12/13/2015 1352   CREATININE 0.86 12/16/2020 1403   CREATININE 0.72 12/11/2016 1252  CREATININE 0.8 12/13/2015 1352      Component Value Date/Time   CALCIUM 9.9 12/16/2020 1403   CALCIUM 9.6 12/11/2016 1252   CALCIUM 9.7 12/13/2015 1352   ALKPHOS 55 12/16/2020 1403   ALKPHOS 80 12/11/2016 1252   ALKPHOS 79 12/13/2015 1352   AST 18 12/16/2020 1403   AST 14 12/13/2015 1352   ALT 11 12/16/2020 1403   ALT 13 12/13/2015 1352   BILITOT 0.3 12/16/2020 1403   BILITOT <0.22 12/13/2015 1352      Impression and Plan: Ms. Brendlinger is a very pleasant 70 yo African American female with history of stage IIa (T2N0M0) ductal carcinoma of the left breast, triple negative. She was diagnosed and treated over 17 yrs ago with lumpectomy and chemotherapy (Adriamycin/Cytoxan).   Again I do not see any evidence of recurrent disease.  I have believe that she is cured given that she has triple negative disease.  We will see what her iron levels look like.  I would have to believe that they are going to be okay.  As always, we will get her back in 1 more year.   Volanda Napoleon, MD 11/21/20223:16 PM

## 2020-12-24 DIAGNOSIS — H409 Unspecified glaucoma: Secondary | ICD-10-CM | POA: Diagnosis not present

## 2020-12-24 DIAGNOSIS — G8929 Other chronic pain: Secondary | ICD-10-CM | POA: Diagnosis not present

## 2020-12-24 DIAGNOSIS — Z823 Family history of stroke: Secondary | ICD-10-CM | POA: Diagnosis not present

## 2020-12-24 DIAGNOSIS — E785 Hyperlipidemia, unspecified: Secondary | ICD-10-CM | POA: Diagnosis not present

## 2020-12-24 DIAGNOSIS — F32A Depression, unspecified: Secondary | ICD-10-CM | POA: Diagnosis not present

## 2020-12-24 DIAGNOSIS — Z7984 Long term (current) use of oral hypoglycemic drugs: Secondary | ICD-10-CM | POA: Diagnosis not present

## 2020-12-24 DIAGNOSIS — E1142 Type 2 diabetes mellitus with diabetic polyneuropathy: Secondary | ICD-10-CM | POA: Diagnosis not present

## 2020-12-24 DIAGNOSIS — Z803 Family history of malignant neoplasm of breast: Secondary | ICD-10-CM | POA: Diagnosis not present

## 2020-12-24 DIAGNOSIS — R030 Elevated blood-pressure reading, without diagnosis of hypertension: Secondary | ICD-10-CM | POA: Diagnosis not present

## 2021-01-06 ENCOUNTER — Other Ambulatory Visit: Payer: Self-pay | Admitting: Hematology & Oncology

## 2021-01-06 DIAGNOSIS — Z1231 Encounter for screening mammogram for malignant neoplasm of breast: Secondary | ICD-10-CM

## 2021-01-13 DIAGNOSIS — M5136 Other intervertebral disc degeneration, lumbar region: Secondary | ICD-10-CM | POA: Diagnosis not present

## 2021-01-13 DIAGNOSIS — M5416 Radiculopathy, lumbar region: Secondary | ICD-10-CM | POA: Diagnosis not present

## 2021-01-13 DIAGNOSIS — M542 Cervicalgia: Secondary | ICD-10-CM | POA: Diagnosis not present

## 2021-01-13 NOTE — Progress Notes (Signed)
Office Visit Note  Patient: Linda [name]             Date of Birth: 1950-04-09           MRN: [id number]             PCP: Lucianne Lei, MD Referring: Lucianne Lei, MD Visit Date: 01/14/2021 Occupation: @GUAROCC @  Subjective:  Other (Bilateral hand pain)   History of Present Illness: Linda [name] is a 70 y.o. female with a history of osteoarthritis, degenerative disc disease and osteoporosis.  She states she continues to have pain and stiffness in her hands.  She has been also having increased lower back pain.  She denies any radiculopathy.  She continues to have some discomfort in the trapezius area.  She has some neck stiffness.  She had surgery in the past.  She continues to have some generalized pain and discomfort from fibromyalgia.  Activities of Daily Living:  Patient reports morning stiffness for 30 minutes.   Patient Reports nocturnal pain.  Difficulty dressing/grooming: Denies Difficulty climbing stairs: Reports Difficulty getting out of chair: Reports Difficulty using hands for taps, buttons, cutlery, and/or writing: Reports  Review of Systems  Constitutional:  Positive for fatigue.  HENT:  Positive for mouth dryness and nose dryness. Negative for mouth sores.   Eyes:  Positive for pain, itching and dryness.  Respiratory:  Negative for shortness of breath and difficulty breathing.   Cardiovascular:  Negative for chest pain and palpitations.  Gastrointestinal:  Positive for constipation and diarrhea. Negative for blood in stool.  Endocrine: Negative for increased urination.  Genitourinary:  Negative for difficulty urinating.  Musculoskeletal:  Positive for joint pain, joint pain, myalgias, morning stiffness, muscle tenderness and myalgias. Negative for joint swelling.  Skin:  Negative for color change, rash and redness.  Allergic/Immunologic: Positive for susceptible to infections.  Neurological:  Positive for dizziness, headaches and weakness. Negative for  numbness and memory loss.  Hematological:  Positive for bruising/bleeding tendency.  Psychiatric/Behavioral:  Negative for confusion. The patient is nervous/anxious.    PMFS History:  Patient Active Problem List   Diagnosis Date Noted   Stage II infiltrating ductal breast carcinoma, ER-, left ([place]) 12/18/2019   Goals of care, counseling/discussion 12/18/2019   IDA (iron deficiency anemia) 12/19/2018   Fatigue 12/10/2015   Primary osteoarthritis of both hands 12/10/2015   Low back pain without sciatica 12/10/2015   Type 2 diabetes mellitus  12/10/2015   Depression 12/10/2015   Chronic pansinusitis 09/26/2015   Genetic testing 01/11/2014   Hives 11/11/2012   Chest pain 04/23/2012   Hyperlipidemia    Chronic insomnia 01/29/2011   Perennial allergic rhinitis 01/29/2011   Fibromyalgia 01/29/2011    Past Medical History:  Diagnosis Date   Allergic rhinitis    Arthritis    Breast cancer ([place]) 2002   left   Diabetes ([place])    Fibromyalgia    Glaucoma    Goals of care, counseling/discussion 12/18/2019   Hypercholesteremia    Hyperlipidemia    Personal history of chemotherapy 05/2000   left breast   Personal history of radiation therapy 2002   [place] breast   Stage II infiltrating ductal breast carcinoma, ER-, left ([place]) 12/18/2019    Family History  Problem Relation Age of Onset   Throat cancer Father    Breast cancer Maternal Aunt    CAD Mother    Stroke Mother    Breast cancer Daughter    Past Surgical History:  Procedure Laterality  Date   APPENDECTOMY  1989   BREAST BIOPSY Left 04/26/2000   BREAST LUMPECTOMY Left 05/14/2000   left   CHOLECYSTECTOMY  1989   KNEE ARTHROSCOPY Right    NECK SURGERY  06/01/2019   SHOULDER SURGERY Left    VAGINAL HYSTERECTOMY  1987   Social History   Social History Narrative   Not on file   Immunization History  Administered Date(s) Administered   Influenza, High Dose Seasonal PF 10/28/2017   Influenza,inj,Quad PF,6+ Mos  12/17/2014   Influenza-Unspecified 11/12/2015   PFIZER(Purple Top)SARS-COV-2 Vaccination 02/18/2019, 03/11/2019   Pneumococcal Conjugate-13 07/20/2016, 10/28/2017     Objective: Vital Signs: BP 134/81 (BP Location: Right Arm, Patient Position: Sitting, Cuff Size: Normal)    Pulse (!) 101    Ht 5\' 2"  (1.575 m)    Wt 142 lb 12.8 oz (64.8 kg)    BMI 26.12 kg/m    Physical Exam Vitals and nursing note reviewed.  Constitutional:      Appearance: She is well-developed.  HENT:     Head: Normocephalic and atraumatic.  Eyes:     Conjunctiva/sclera: Conjunctivae normal.  Cardiovascular:     Rate and Rhythm: Normal rate and regular rhythm.     Heart sounds: Normal heart sounds.  Pulmonary:     Effort: Pulmonary effort is normal.     Breath sounds: Normal breath sounds.  Abdominal:     General: Bowel sounds are normal.     Palpations: Abdomen is soft.  Musculoskeletal:     Cervical back: Normal range of motion.  Lymphadenopathy:     Cervical: No cervical adenopathy.  Skin:    General: Skin is warm and dry.     Capillary Refill: Capillary refill takes less than 2 seconds.  Neurological:     Mental Status: She is alert and oriented to person, place, and time.  Psychiatric:        Behavior: Behavior normal.     Musculoskeletal Exam: She has some limitation with range of motion of her cervical spine.  Thoracic spine was in good range of motion.  She had discomfort range of motion of her lumbar spine.  Shoulder joints, elbow joints, wrist joints, MCPs PIPs and DIPs with good range of motion with no synovitis.  Hip joints, knee joints, ankles with good range of motion.  She had no tenderness over MTPs.  She had tenderness over bilateral trochanteric bursa.  CDAI Exam: CDAI Score: -- Patient Global: --; Provider Global: -- Swollen: --; Tender: -- Joint Exam 01/14/2021   No joint exam has been documented for this visit   There is currently no information documented on the homunculus. Go  to the Rheumatology activity and complete the homunculus joint exam.  Investigation: No additional findings.  Imaging: No results found.  Recent Labs: Lab Results  Component Value Date   WBC 5.8 12/16/2020   HGB 11.6 (L) 12/16/2020   PLT 254 12/16/2020   NA 140 12/16/2020   K 4.0 12/16/2020   CL 103 12/16/2020   CO2 30 12/16/2020   GLUCOSE 181 (H) 12/16/2020   BUN 16 12/16/2020   CREATININE 0.86 12/16/2020   BILITOT 0.3 12/16/2020   ALKPHOS 55 12/16/2020   AST 18 12/16/2020   ALT 11 12/16/2020   PROT 7.1 12/16/2020   ALBUMIN 4.4 12/16/2020   CALCIUM 9.9 12/16/2020   GFRAA >60 12/16/2018    Speciality Comments: No specialty comments available.  Procedures:  No procedures performed Allergies: Sulfa antibiotics   Assessment /  Plan:     Visit Diagnoses: Primary osteoarthritis of both hands-she is experiencing increased pain and discomfort in her bilateral hands.  No synovitis was noted.  Joint protection muscle strengthening was discussed.  A handout on hand exercises was given.  Greater trochanteric bursitis of both hips-IT band stretches were discussed.  Chronic pain of left knee - X-rays from February 01, 2020 showed moderate osteoarthritis and moderate chondromalacia patella.  She has off-and-on discomfort.  She had no synovitis on examination.  Primary osteoarthritis of both knees-lower extremity muscle strengthening exercises were discussed.  She uses topical Voltaren gel which is useful.  Trapezius muscle spasm-she has neck muscle spasm.  S/P cervical spinal fusion - May 2021 performed by Dr. Christella Noa.  She has limited lateral rotation.  Chronic midline low back pain without sciatica-she has been experiencing increased lower back pain without any radiculopathy.  Pulm strengthening muscle exercises were demonstrated.  A handout on exercises was given.  Fibromyalgia-she continues to have some generalized pain and discomfort.  Need for regular exercise and stretching  was discussed.  She uses Lidoderm patches as needed.  Other fatigue-related to fibromyalgia and insomnia.  Chronic insomnia -good sleep hygiene was discussed.  Other medical problems are listed as follows:  History of diabetes mellitus  History of hyperlipidemia  History of depression  Osteoporosis, post-menopausal-I do not have DEXA results available to review.  Patient states she is not taking any medications.  Use of calcium rich diet with vitamin D was discussed.  Need for regular exercise was emphasized.  Orders: No orders of the defined types were placed in this encounter.  Meds ordered this encounter  Medications   lidocaine (LIDODERM) 5 %    Sig: Place 1 patch onto the skin daily. Remove & Discard patch within 12 hours or as directed by MD    Dispense:  30 patch    Refill:  0   diclofenac Sodium (VOLTAREN) 1 % GEL    Sig: Apply 2-4 grams to affected joint 4 times daily as needed.    Dispense:  400 g    Refill:  2     Follow-Up Instructions: Return in about 6 months (around 07/15/2021) for Osteoarthritis.   Bo Merino, MD  Note - This record has been created using Editor, commissioning.  Chart creation errors have been sought, but may not always  have been located. Such creation errors do not reflect on  the standard of medical care.

## 2021-01-14 ENCOUNTER — Ambulatory Visit: Payer: Medicare PPO | Admitting: Rheumatology

## 2021-01-14 ENCOUNTER — Other Ambulatory Visit: Payer: Self-pay

## 2021-01-14 ENCOUNTER — Encounter: Payer: Self-pay | Admitting: Rheumatology

## 2021-01-14 VITALS — BP 134/81 | HR 101 | Ht 62.0 in | Wt 142.8 lb

## 2021-01-14 DIAGNOSIS — G8929 Other chronic pain: Secondary | ICD-10-CM

## 2021-01-14 DIAGNOSIS — Z8659 Personal history of other mental and behavioral disorders: Secondary | ICD-10-CM

## 2021-01-14 DIAGNOSIS — M810 Age-related osteoporosis without current pathological fracture: Secondary | ICD-10-CM

## 2021-01-14 DIAGNOSIS — M170 Bilateral primary osteoarthritis of knee: Secondary | ICD-10-CM | POA: Diagnosis not present

## 2021-01-14 DIAGNOSIS — M7061 Trochanteric bursitis, right hip: Secondary | ICD-10-CM | POA: Diagnosis not present

## 2021-01-14 DIAGNOSIS — Z981 Arthrodesis status: Secondary | ICD-10-CM | POA: Diagnosis not present

## 2021-01-14 DIAGNOSIS — M25562 Pain in left knee: Secondary | ICD-10-CM | POA: Diagnosis not present

## 2021-01-14 DIAGNOSIS — R5383 Other fatigue: Secondary | ICD-10-CM | POA: Diagnosis not present

## 2021-01-14 DIAGNOSIS — M62838 Other muscle spasm: Secondary | ICD-10-CM

## 2021-01-14 DIAGNOSIS — M19041 Primary osteoarthritis, right hand: Secondary | ICD-10-CM | POA: Diagnosis not present

## 2021-01-14 DIAGNOSIS — F5104 Psychophysiologic insomnia: Secondary | ICD-10-CM

## 2021-01-14 DIAGNOSIS — M545 Low back pain, unspecified: Secondary | ICD-10-CM

## 2021-01-14 DIAGNOSIS — M7062 Trochanteric bursitis, left hip: Secondary | ICD-10-CM

## 2021-01-14 DIAGNOSIS — M797 Fibromyalgia: Secondary | ICD-10-CM

## 2021-01-14 DIAGNOSIS — Z8639 Personal history of other endocrine, nutritional and metabolic disease: Secondary | ICD-10-CM

## 2021-01-14 DIAGNOSIS — M19042 Primary osteoarthritis, left hand: Secondary | ICD-10-CM

## 2021-01-14 MED ORDER — LIDOCAINE 5 % EX PTCH: 1.0000 | MEDICATED_PATCH | CUTANEOUS | 0 refills | Status: AC

## 2021-01-14 MED ORDER — DICLOFENAC SODIUM 1 % EX GEL: CUTANEOUS | 2 refills | Status: DC

## 2021-01-14 NOTE — Patient Instructions (Signed)
Back Exercises The following exercises strengthen the muscles that help to support the trunk (torso) and back. They also help to keep the lower back flexible. Doing these exercises can help to prevent or lessen existing low back pain. If you have back pain or discomfort, try doing these exercises 2-3 times each day or as told by your health care provider. As your pain improves, do them once each day, but increase the number of times that you repeat the steps for each exercise (do more repetitions). To prevent the recurrence of back pain, continue to do these exercises once each day or as told by your health care provider. Do exercises exactly as told by your health care provider and adjust them as directed. It is normal to feel mild stretching, pulling, tightness, or discomfort as you do these exercises, but you should stop right away if you feel sudden pain or your pain gets worse. Exercises Single knee to chest Repeat these steps 3-5 times for each leg: Lie on your back on a firm bed or the floor with your legs extended. Bring one knee to your chest. Your other leg should stay extended and in contact with the floor. Hold your knee in place by grabbing your knee or thigh with both hands and hold. Pull on your knee until you feel a gentle stretch in your lower back or buttocks. Hold the stretch for 10-30 seconds. Slowly release and straighten your leg.  Pelvic tilt Repeat these steps 5-10 times: Lie on your back on a firm bed or the floor with your legs extended. Bend your knees so they are pointing toward the ceiling and your feet are flat on the floor. Tighten your lower abdominal muscles to press your lower back against the floor. This motion will tilt your pelvis so your tailbone points up toward the ceiling instead of pointing to your feet or the floor. With gentle tension and even breathing, hold this position for 5-10 seconds.  Cat-cow Repeat these steps until your lower back becomes  more flexible: Get into a hands-and-knees position on a firm bed or the floor. Keep your hands under your shoulders, and keep your knees under your hips. You may place padding under your knees for comfort. Let your head hang down toward your chest. Contract your abdominal muscles and point your tailbone toward the floor so your lower back becomes rounded like the back of a cat. Hold this position for 5 seconds. Slowly lift your head, let your abdominal muscles relax, and point your tailbone up toward the ceiling so your back forms a sagging arch like the back of a cow. Hold this position for 5 seconds.  Press-ups Repeat these steps 5-10 times: Lie on your abdomen (face-down) on a firm bed or the floor. Place your palms near your head, about shoulder-width apart. Keeping your back as relaxed as possible and keeping your hips on the floor, slowly straighten your arms to raise the top half of your body and lift your shoulders. Do not use your back muscles to raise your upper torso. You may adjust the placement of your hands to make yourself more comfortable. Hold this position for 5 seconds while you keep your back relaxed. Slowly return to lying flat on the floor.  Bridges Repeat these steps 10 times: Lie on your back on a firm bed or the floor. Bend your knees so they are pointing toward the ceiling and your feet are flat on the floor. Your arms should be flat  at your sides, next to your body. Tighten your buttocks muscles and lift your buttocks off the floor until your waist is at almost the same height as your knees. You should feel the muscles working in your buttocks and the back of your thighs. If you do not feel these muscles, slide your feet 1-2 inches (2.5-5 cm) farther away from your buttocks. Hold this position for 3-5 seconds. Slowly lower your hips to the starting position, and allow your buttocks muscles to relax completely. If this exercise is too easy, try doing it with your arms  crossed over your chest. Abdominal crunches Repeat these steps 5-10 times: Lie on your back on a firm bed or the floor with your legs extended. Bend your knees so they are pointing toward the ceiling and your feet are flat on the floor. Cross your arms over your chest. Tip your chin slightly toward your chest without bending your neck. Tighten your abdominal muscles and slowly raise your torso high enough to lift your shoulder blades a tiny bit off the floor. Avoid raising your torso higher than that because it can put too much stress on your lower back and does not help to strengthen your abdominal muscles. Slowly return to your starting position.  Back lifts Repeat these steps 5-10 times: Lie on your abdomen (face-down) with your arms at your sides, and rest your forehead on the floor. Tighten the muscles in your legs and your buttocks. Slowly lift your chest off the floor while you keep your hips pressed to the floor. Keep the back of your head in line with the curve in your back. Your eyes should be looking at the floor. Hold this position for 3-5 seconds. Slowly return to your starting position.  Contact a health care provider if: Your back pain or discomfort gets much worse when you do an exercise. Your worsening back pain or discomfort does not lessen within 2 hours after you exercise. If you have any of these problems, stop doing these exercises right away. Do not do them again unless your health care provider says that you can. Get help right away if: You develop sudden, severe back pain. If this happens, stop doing the exercises right away. Do not do them again unless your health care provider says that you can. This information is not intended to replace advice given to you by your health care provider. Make sure you discuss any questions you have with your health care provider.  Hand Exercises Hand exercises can be helpful for almost anyone. These exercises can strengthen the  hands, improve flexibility and movement, and increase blood flow to the hands. These results can make work and daily tasks easier. Hand exercises can be especially helpful for people who have joint pain from arthritis or have nerve damage from overuse (carpal tunnel syndrome). These exercises can also help people who have injured a hand. Exercises Most of these hand exercises are gentle stretching and motion exercises. It is usually safe to do them often throughout the day. Warming up your hands before exercise may help to reduce stiffness. You can do this with gentle massage or by placing your hands in warm water for 10-15 minutes. It is normal to feel some stretching, pulling, tightness, or mild discomfort as you begin new exercises. This will gradually improve. Stop an exercise right away if you feel sudden, severe pain or your pain gets worse. Ask your health care provider which exercises are best for you. Knuckle bend or "  claw" fist  Stand or sit with your arm, hand, and all five fingers pointed straight up. Make sure to keep your wrist straight during the exercise. Gently bend your fingers down toward your palm until the tips of your fingers are touching the top of your palm. Keep your big knuckle straight and just bend the small knuckles in your fingers. Hold this position for __________ seconds. Straighten (extend) your fingers back to the starting position. Repeat this exercise 5-10 times with each hand. Full finger fist  Stand or sit with your arm, hand, and all five fingers pointed straight up. Make sure to keep your wrist straight during the exercise. Gently bend your fingers into your palm until the tips of your fingers are touching the middle of your palm. Hold this position for __________ seconds. Extend your fingers back to the starting position, stretching every joint fully. Repeat this exercise 5-10 times with each hand. Straight fist Stand or sit with your arm, hand, and all  five fingers pointed straight up. Make sure to keep your wrist straight during the exercise. Gently bend your fingers at the big knuckle, where your fingers meet your hand, and the middle knuckle. Keep the knuckle at the tips of your fingers straight and try to touch the bottom of your palm. Hold this position for __________ seconds. Extend your fingers back to the starting position, stretching every joint fully. Repeat this exercise 5-10 times with each hand. Tabletop  Stand or sit with your arm, hand, and all five fingers pointed straight up. Make sure to keep your wrist straight during the exercise. Gently bend your fingers at the big knuckle, where your fingers meet your hand, as far down as you can while keeping the small knuckles in your fingers straight. Think of forming a tabletop with your fingers. Hold this position for __________ seconds. Extend your fingers back to the starting position, stretching every joint fully. Repeat this exercise 5-10 times with each hand. Finger spread  Place your hand flat on a table with your palm facing down. Make sure your wrist stays straight as you do this exercise. Spread your fingers and thumb apart from each other as far as you can until you feel a gentle stretch. Hold this position for __________ seconds. Bring your fingers and thumb tight together again. Hold this position for __________ seconds. Repeat this exercise 5-10 times with each hand. Making circles  Stand or sit with your arm, hand, and all five fingers pointed straight up. Make sure to keep your wrist straight during the exercise. Make a circle by touching the tip of your thumb to the tip of your index finger. Hold for __________ seconds. Then open your hand wide. Repeat this motion with your thumb and each finger on your hand. Repeat this exercise 5-10 times with each hand. Thumb motion  Sit with your forearm resting on a table and your wrist straight. Your thumb should be facing  up toward the ceiling. Keep your fingers relaxed as you move your thumb. Lift your thumb up as high as you can toward the ceiling. Hold for __________ seconds. Bend your thumb across your palm as far as you can, reaching the tip of your thumb for the small finger (pinkie) side of your palm. Hold for __________ seconds. Repeat this exercise 5-10 times with each hand. Grip strengthening  Hold a stress ball or other soft ball in the middle of your hand. Slowly increase the pressure, squeezing the ball as much as  you can without causing pain. Think of bringing the tips of your fingers into the middle of your palm. All of your finger joints should bend when doing this exercise. Hold your squeeze for __________ seconds, then relax. Repeat this exercise 5-10 times with each hand. Contact a health care provider if: Your hand pain or discomfort gets much worse when you do an exercise. Your hand pain or discomfort does not improve within 2 hours after you exercise. If you have any of these problems, stop doing these exercises right away. Do not do them again unless your health care provider says that you can. Get help right away if: You develop sudden, severe hand pain or swelling. If this happens, stop doing these exercises right away. Do not do them again unless your health care provider says that you can. This information is not intended to replace advice given to you by your health care provider. Make sure you discuss any questions you have with your health care provider. Document Revised: 05/02/2020 Document Reviewed: 05/02/2020 Elsevier Patient Education  [place].

## 2021-01-28 ENCOUNTER — Other Ambulatory Visit: Payer: Self-pay | Admitting: Hematology & Oncology

## 2021-01-29 ENCOUNTER — Encounter: Payer: Self-pay | Admitting: Family

## 2021-02-06 ENCOUNTER — Encounter: Payer: Self-pay | Admitting: Family

## 2021-02-06 ENCOUNTER — Ambulatory Visit
Admission: RE | Admit: 2021-02-06 | Discharge: 2021-02-06 | Disposition: A | Discharge status: Home / Self Care | Payer: Medicare PPO | Source: Ambulatory Visit | Attending: Hematology & Oncology | Admitting: Hematology & Oncology

## 2021-02-06 DIAGNOSIS — Z1231 Encounter for screening mammogram for malignant neoplasm of breast: Secondary | ICD-10-CM

## 2021-02-17 DIAGNOSIS — Z6826 Body mass index (BMI) 26.0-26.9, adult: Secondary | ICD-10-CM | POA: Diagnosis not present

## 2021-02-17 DIAGNOSIS — E1169 Type 2 diabetes mellitus with other specified complication: Secondary | ICD-10-CM | POA: Diagnosis not present

## 2021-02-17 DIAGNOSIS — E785 Hyperlipidemia, unspecified: Secondary | ICD-10-CM | POA: Diagnosis not present

## 2021-02-17 DIAGNOSIS — K5900 Constipation, unspecified: Secondary | ICD-10-CM | POA: Diagnosis not present

## 2021-02-17 DIAGNOSIS — I10 Essential (primary) hypertension: Secondary | ICD-10-CM | POA: Diagnosis not present

## 2021-02-19 DIAGNOSIS — H9319 Tinnitus, unspecified ear: Secondary | ICD-10-CM | POA: Diagnosis not present

## 2021-02-19 DIAGNOSIS — E1169 Type 2 diabetes mellitus with other specified complication: Secondary | ICD-10-CM | POA: Diagnosis not present

## 2021-04-01 DIAGNOSIS — F1120 Opioid dependence, uncomplicated: Secondary | ICD-10-CM | POA: Diagnosis not present

## 2021-04-01 DIAGNOSIS — M542 Cervicalgia: Secondary | ICD-10-CM | POA: Diagnosis not present

## 2021-04-01 DIAGNOSIS — M5416 Radiculopathy, lumbar region: Secondary | ICD-10-CM | POA: Diagnosis not present

## 2021-04-01 DIAGNOSIS — M5136 Other intervertebral disc degeneration, lumbar region: Secondary | ICD-10-CM | POA: Diagnosis not present

## 2021-04-10 DIAGNOSIS — H401131 Primary open-angle glaucoma, bilateral, mild stage: Secondary | ICD-10-CM | POA: Diagnosis not present

## 2021-04-10 DIAGNOSIS — H2513 Age-related nuclear cataract, bilateral: Secondary | ICD-10-CM | POA: Diagnosis not present

## 2021-05-01 ENCOUNTER — Telehealth: Payer: Self-pay

## 2021-05-01 NOTE — Telephone Encounter (Signed)
Patient does not have an orthopedic doctor, patient advised to go to an urgent care or orthopedic doctor in case she has a fracture. ?

## 2021-05-01 NOTE — Telephone Encounter (Signed)
Patient left a voicemail stating she fell and hurt her knees and requested a return call to let her know if she needs to be seen.   ?

## 2021-05-02 DIAGNOSIS — S8002XA Contusion of left knee, initial encounter: Secondary | ICD-10-CM | POA: Diagnosis not present

## 2021-05-02 DIAGNOSIS — S8001XA Contusion of right knee, initial encounter: Secondary | ICD-10-CM | POA: Diagnosis not present

## 2021-05-02 DIAGNOSIS — W19XXXA Unspecified fall, initial encounter: Secondary | ICD-10-CM | POA: Diagnosis not present

## 2021-06-24 DIAGNOSIS — E7220 Disorder of urea cycle metabolism, unspecified: Secondary | ICD-10-CM | POA: Diagnosis not present

## 2021-06-24 DIAGNOSIS — M5416 Radiculopathy, lumbar region: Secondary | ICD-10-CM | POA: Diagnosis not present

## 2021-06-24 DIAGNOSIS — I10 Essential (primary) hypertension: Secondary | ICD-10-CM | POA: Diagnosis not present

## 2021-06-24 DIAGNOSIS — M542 Cervicalgia: Secondary | ICD-10-CM | POA: Diagnosis not present

## 2021-06-24 DIAGNOSIS — E1169 Type 2 diabetes mellitus with other specified complication: Secondary | ICD-10-CM | POA: Diagnosis not present

## 2021-06-24 DIAGNOSIS — M5136 Other intervertebral disc degeneration, lumbar region: Secondary | ICD-10-CM | POA: Diagnosis not present

## 2021-06-25 DIAGNOSIS — B91 Sequelae of poliomyelitis: Secondary | ICD-10-CM | POA: Diagnosis not present

## 2021-06-25 DIAGNOSIS — J320 Chronic maxillary sinusitis: Secondary | ICD-10-CM | POA: Diagnosis not present

## 2021-06-25 DIAGNOSIS — E1169 Type 2 diabetes mellitus with other specified complication: Secondary | ICD-10-CM | POA: Diagnosis not present

## 2021-06-25 DIAGNOSIS — R5383 Other fatigue: Secondary | ICD-10-CM | POA: Diagnosis not present

## 2021-06-25 DIAGNOSIS — Z0000 Encounter for general adult medical examination without abnormal findings: Secondary | ICD-10-CM | POA: Diagnosis not present

## 2021-07-02 NOTE — Progress Notes (Unsigned)
Office Visit Note  Patient: Linda [name]             Date of Birth: 1950-06-26           MRN: [id number]             PCP: Lucianne Lei, MD Referring: Lucianne Lei, MD Visit Date: 07/15/2021 Occupation: '@GUAROCC'$ @  Subjective:  Right ring trigger finger   History of Present Illness: Jasmain Ahlberg is a 71 y.o. female with history of fibromyalgia and osteoarthritis.  Patient presents today with pain in her right fourth finger.  She states for the past 1 month she has been experiencing locking and soreness in her right ring finger.  She denies any overuse activities or recent injuries.  She has tried Voltaren gel with no improvement in her symptoms.  She denies any joint swelling.  She states she continues to have occasional discomfort in both knee joints but denies any inflammation at this time.  She has intermittent pain in bilateral trochanteric bursa especially the right side.  She continues to experience intermittent myalgias and muscle tenderness due to fibromyalgia.  Overall her energy level has been stable and she has been sleeping well at night.  She has been taking Flexeril 10 mg 3 times daily as needed for muscle spasms, hydrocodone as needed for pain relief, gabapentin 300 mg at bedtime.  Activities of Daily Living:  Patient reports morning stiffness for 0 minutes  Patient Reports nocturnal pain.  Difficulty dressing/grooming: Denies Difficulty climbing stairs: Reports Difficulty getting out of chair: Reports Difficulty using hands for taps, buttons, cutlery, and/or writing: Reports  Review of Systems  Constitutional:  Positive for fatigue.  HENT:  Positive for mouth dryness. Negative for mouth sores and nose dryness.   Eyes:  Positive for dryness. Negative for pain and visual disturbance.  Respiratory:  Negative for cough, hemoptysis, shortness of breath and difficulty breathing.   Cardiovascular:  Negative for chest pain, palpitations, hypertension and swelling in  legs/feet.  Gastrointestinal:  Positive for constipation. Negative for blood in stool and diarrhea.  Endocrine: Negative for excessive thirst and increased urination.  Genitourinary:  Negative for difficulty urinating and painful urination.  Musculoskeletal:  Positive for joint pain, gait problem, joint pain, joint swelling, muscle weakness and muscle tenderness. Negative for myalgias, morning stiffness and myalgias.  Skin:  Negative for color change, pallor, rash, hair loss, nodules/bumps, skin tightness, ulcers and sensitivity to sunlight.  Allergic/Immunologic: Negative for susceptible to infections.  Neurological:  Negative for dizziness, numbness and headaches.  Hematological:  Positive for bruising/bleeding tendency. Negative for swollen glands.  Psychiatric/Behavioral:  Positive for sleep disturbance. Negative for depressed mood. The patient is not nervous/anxious.     PMFS History:  Patient Active Problem List   Diagnosis Date Noted   Stage II infiltrating ductal breast carcinoma, ER-, left ([place]) 12/18/2019   Goals of care, counseling/discussion 12/18/2019   IDA (iron deficiency anemia) 12/19/2018   Fatigue 12/10/2015   Primary osteoarthritis of both hands 12/10/2015   Low back pain without sciatica 12/10/2015   Type 2 diabetes mellitus  12/10/2015   Depression 12/10/2015   Chronic pansinusitis 09/26/2015   Genetic testing 01/11/2014   Hives 11/11/2012   Chest pain 04/23/2012   Hyperlipidemia    Chronic insomnia 01/29/2011   Perennial allergic rhinitis 01/29/2011   Fibromyalgia 01/29/2011    Past Medical History:  Diagnosis Date   Allergic rhinitis    Arthritis    Breast cancer ([place]) 2002  left   Diabetes ([place])    Fibromyalgia    Glaucoma    Goals of care, counseling/discussion 12/18/2019   Hypercholesteremia    Hyperlipidemia    Personal history of chemotherapy 05/2000   left breast   Personal history of radiation therapy 2002   Letf breast   Stage II  infiltrating ductal breast carcinoma, ER-, left ([place]) 12/18/2019    Family History  Problem Relation Age of Onset   Throat cancer Father    Breast cancer Maternal Aunt    CAD Mother    Stroke Mother    Breast cancer Daughter    Past Surgical History:  Procedure Laterality Date   APPENDECTOMY  1989   BREAST BIOPSY Left 04/26/2000   BREAST LUMPECTOMY Left 05/14/2000   left   CHOLECYSTECTOMY  1989   KNEE ARTHROSCOPY Right    NECK SURGERY  06/01/2019   SHOULDER SURGERY Left    VAGINAL HYSTERECTOMY  1987   Social History   Social History Narrative   Not on file   Immunization History  Administered Date(s) Administered   Influenza, High Dose Seasonal PF 10/28/2017   Influenza,inj,Quad PF,6+ Mos 12/17/2014   Influenza-Unspecified 11/12/2015   PFIZER(Purple Top)SARS-COV-2 Vaccination 02/18/2019, 03/11/2019   Pneumococcal Conjugate-13 07/20/2016, 10/28/2017     Objective: Vital Signs: BP 128/78 (BP Location: Right Arm, Patient Position: Sitting, Cuff Size: Small)   Pulse (!) 106   Resp 12   Ht '5\' 2"'$  (1.575 m)   Wt 141 lb (64 kg)   BMI 25.79 kg/m    Physical Exam Vitals and nursing note reviewed.  Constitutional:      Appearance: She is well-developed.  HENT:     Head: Normocephalic and atraumatic.  Eyes:     Conjunctiva/sclera: Conjunctivae normal.  Cardiovascular:     Rate and Rhythm: Normal rate and regular rhythm.     Heart sounds: Normal heart sounds.  Pulmonary:     Effort: Pulmonary effort is normal.     Breath sounds: Normal breath sounds.  Abdominal:     General: Bowel sounds are normal.     Palpations: Abdomen is soft.  Musculoskeletal:     Cervical back: Normal range of motion.  Skin:    General: Skin is warm and dry.     Capillary Refill: Capillary refill takes less than 2 seconds.  Neurological:     Mental Status: She is alert and oriented to person, place, and time.  Psychiatric:        Behavior: Behavior normal.      Musculoskeletal Exam:  C-spine has limited range of motion with lateral rotation.  Trapezius muscle tension and tenderness bilaterally.  Thoracic and lumbar spine have good range of motion.  No midline spinal tenderness.  Some tenderness over the right SI joint.  Shoulder joints, elbow joints, wrist joints, MCPs, PIPs, DIPs have good range of motion with no synovitis.  Right ring trigger finger noted.  Complete fist formation bilaterally.  Some PIP and DIP thickening consistent with osteoarthritis of both hands.  Hip joints have good range of motion with no groin pain.  Tenderness ovation over bilateral trochanteric bursa, right greater than left.  Knee joints have good range of motion with crepitus but no warmth or effusion was noted.  Ankle joints have good range of motion with no tenderness or joint swelling.  CDAI Exam: CDAI Score: -- Patient Global: --; Provider Global: -- Swollen: --; Tender: -- Joint Exam 07/15/2021   No joint exam has been documented  for this visit   There is currently no information documented on the homunculus. Go to the Rheumatology activity and complete the homunculus joint exam.  Investigation: No additional findings.  Imaging: No results found.  Recent Labs: Lab Results  Component Value Date   WBC 5.8 12/16/2020   HGB 11.6 (L) 12/16/2020   PLT 254 12/16/2020   NA 140 12/16/2020   K 4.0 12/16/2020   CL 103 12/16/2020   CO2 30 12/16/2020   GLUCOSE 181 (H) 12/16/2020   BUN 16 12/16/2020   CREATININE 0.86 12/16/2020   BILITOT 0.3 12/16/2020   ALKPHOS 55 12/16/2020   AST 18 12/16/2020   ALT 11 12/16/2020   PROT 7.1 12/16/2020   ALBUMIN 4.4 12/16/2020   CALCIUM 9.9 12/16/2020   GFRAA >60 12/16/2018    Speciality Comments: No specialty comments available.  Procedures:  No procedures performed Allergies: Sulfa antibiotics   Assessment / Plan:     Visit Diagnoses: Primary osteoarthritis of both hands: She has PIP and DIP thickening consistent with osteoarthritis of both  hands.  No synovitis was noted.  Complete fist formation bilaterally.  Discussed the importance of joint protection and muscle strengthening.  She was advised to notify us if she develops increased joint pain or joint swelling.  She will follow-up in the office in 6 months or sooner if needed.  Trigger finger, right ring finger: She has been experiencing tenderness and locking of the right ring finger for the past 1 month.  She has not been performing any overuse activities.  Discussed the diagnosis of a trigger finger as well as conservative treatment options.  She plans on trying buddy tape to the adjacent finger or using a splint.  I also discussed the use of applying Voltaren gel topically as needed for symptomatic relief.  If her symptoms persist or worsen she will call to schedule an ultrasound guided cortisone injection.  Greater trochanteric bursitis of both hips: She experiences intermittent discomfort due to trochanteric bursitis of both hips.  Discussed the importance of performing stretching exercises daily.  Primary osteoarthritis of both knees - X-rays from February 01, 2020 showed moderate osteoarthritis and moderate chondromalacia patella.  She experiences intermittent discomfort in her knee joints.  She has good range of motion of both knee joints on examination today.  No warmth or effusion was noted.  She can continue to use Voltaren gel topically as needed for pain relief.  Fibromyalgia: She has intermittent myalgias and muscle tenderness due to fibromyalgia.  Discussed the importance of regular exercise and good sleep hygiene.  She continues to take Flexeril 10 mg 3 times daily as needed for muscle spasms, hydrocodone as needed for pain relief, and gabapentin 300 mg at bedtime.  She uses Lidoderm patches as needed for pain relief.  Trapezius muscle spasm: She has trapezius muscle tension and tenderness bilaterally.  She experiences muscle spasms intermittently.  She takes Flexeril 10 mg  3 times daily as needed for flares.  S/P cervical spinal fusion - May 2021 performed by Dr. Christella Noa.She has limited ROM with lateral rotation.   Other fatigue: Stable.    Chronic insomnia: She has some difficulty falling asleep at times.  She takes gabapentin 300 mg at bedtime.  Discussed the importance of good sleep hygiene and importance of restorative sleep.   Other medical conditions are listed as follows:   Osteoporosis, post-menopausal: DEXA ordered by PCP.   History of diabetes mellitus  History of hyperlipidemia  History of depression  Orders:  No orders of the defined types were placed in this encounter.  No orders of the defined types were placed in this encounter.   Follow-Up Instructions: Return in about 6 months (around 01/14/2022) for Fibromyalgia, Osteoarthritis.   Ofilia Neas, PA-C  Note - This record has been created using Dragon software.  Chart creation errors have been sought, but may not always  have been located. Such creation errors do not reflect on  the standard of medical care.

## 2021-07-08 DIAGNOSIS — H2513 Age-related nuclear cataract, bilateral: Secondary | ICD-10-CM | POA: Diagnosis not present

## 2021-07-08 DIAGNOSIS — H35033 Hypertensive retinopathy, bilateral: Secondary | ICD-10-CM | POA: Diagnosis not present

## 2021-07-08 DIAGNOSIS — H401131 Primary open-angle glaucoma, bilateral, mild stage: Secondary | ICD-10-CM | POA: Diagnosis not present

## 2021-07-15 ENCOUNTER — Ambulatory Visit: Payer: Medicare PPO | Admitting: Physician Assistant

## 2021-07-15 ENCOUNTER — Encounter: Payer: Self-pay | Admitting: Physician Assistant

## 2021-07-15 VITALS — BP 128/78 | HR 106 | Resp 12 | Ht 62.0 in | Wt 141.0 lb

## 2021-07-15 DIAGNOSIS — R5383 Other fatigue: Secondary | ICD-10-CM | POA: Diagnosis not present

## 2021-07-15 DIAGNOSIS — Z8639 Personal history of other endocrine, nutritional and metabolic disease: Secondary | ICD-10-CM

## 2021-07-15 DIAGNOSIS — M19042 Primary osteoarthritis, left hand: Secondary | ICD-10-CM | POA: Diagnosis not present

## 2021-07-15 DIAGNOSIS — M19041 Primary osteoarthritis, right hand: Secondary | ICD-10-CM

## 2021-07-15 DIAGNOSIS — M810 Age-related osteoporosis without current pathological fracture: Secondary | ICD-10-CM

## 2021-07-15 DIAGNOSIS — M65341 Trigger finger, right ring finger: Secondary | ICD-10-CM

## 2021-07-15 DIAGNOSIS — M7062 Trochanteric bursitis, left hip: Secondary | ICD-10-CM

## 2021-07-15 DIAGNOSIS — M62838 Other muscle spasm: Secondary | ICD-10-CM | POA: Diagnosis not present

## 2021-07-15 DIAGNOSIS — M797 Fibromyalgia: Secondary | ICD-10-CM | POA: Diagnosis not present

## 2021-07-15 DIAGNOSIS — M7061 Trochanteric bursitis, right hip: Secondary | ICD-10-CM

## 2021-07-15 DIAGNOSIS — M170 Bilateral primary osteoarthritis of knee: Secondary | ICD-10-CM

## 2021-07-15 DIAGNOSIS — F5104 Psychophysiologic insomnia: Secondary | ICD-10-CM | POA: Diagnosis not present

## 2021-07-15 DIAGNOSIS — Z8659 Personal history of other mental and behavioral disorders: Secondary | ICD-10-CM

## 2021-07-15 DIAGNOSIS — Z981 Arthrodesis status: Secondary | ICD-10-CM

## 2021-08-11 IMAGING — MR MR CERVICAL SPINE W/O CM
5 series · 36 of 48 positions shown · non-contrast
Comparison: 12/06/2018 and prior.

CLINICAL DATA: Neck pain radiating into shoulder, chronic. Right
arm weakness.

EXAM:
MRI CERVICAL SPINE WITHOUT CONTRAST
TECHNIQUE: Multiplanar, multisequence MR imaging of the cervical spine was
performed. No intravenous contrast was administered.

[Series 3: T2 · sagittal · 3.0mm · 0.41mm/px · 7 of 18 slices shown (1 of 2)]
[im 1/18]
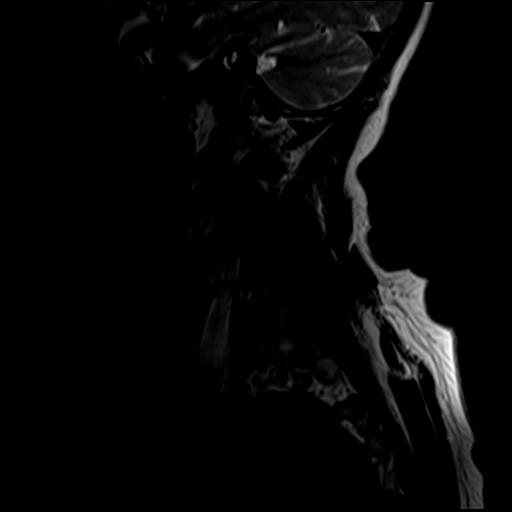
[im 3/18]
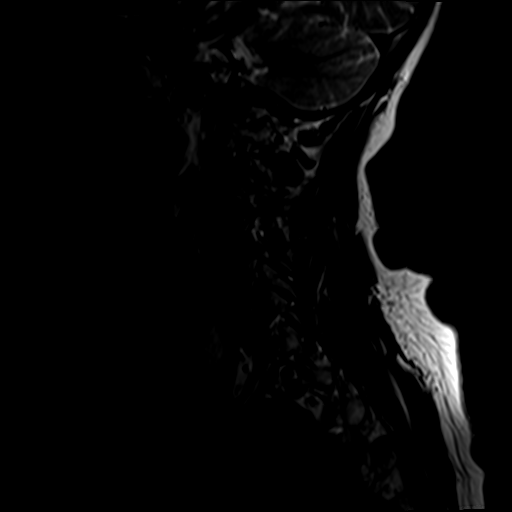
[im 6/18]
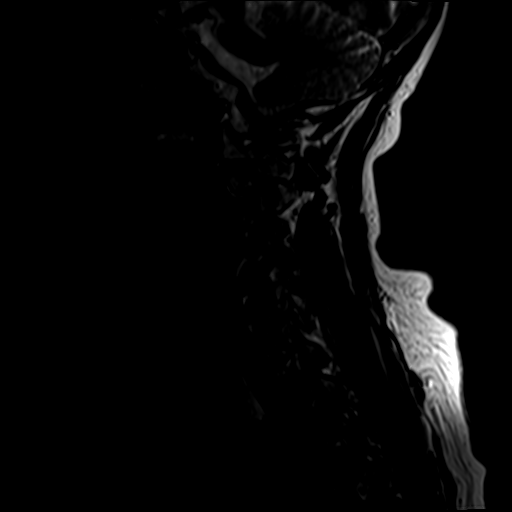
[im 9/18]
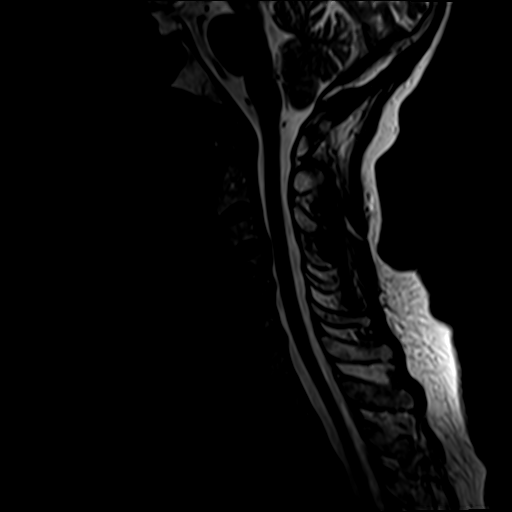
[im 12/18]
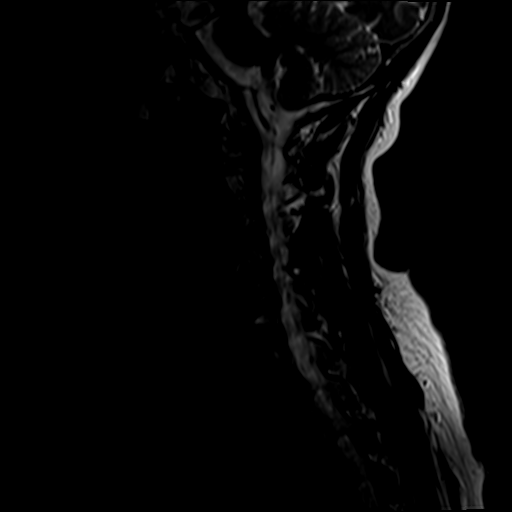
[im 15/18]
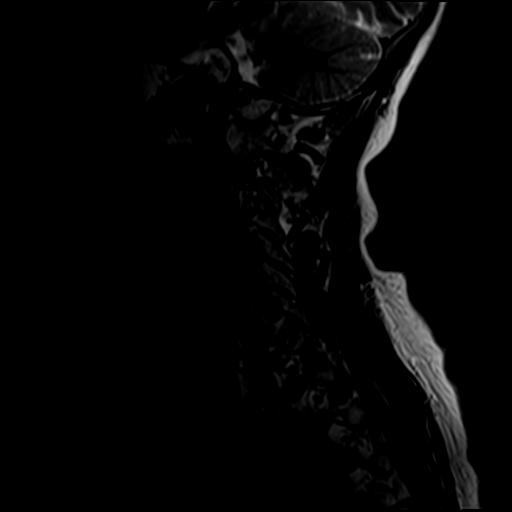
[im 18/18]
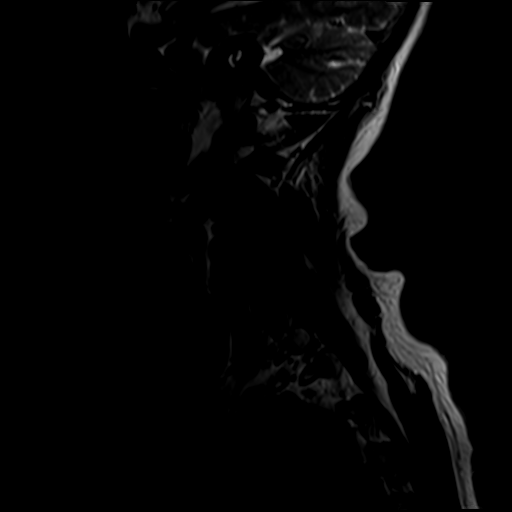

[Series 4: STIR · sagittal · 3.0mm · 0.82mm/px · 7 of 18 slices shown]
[im 1/18]
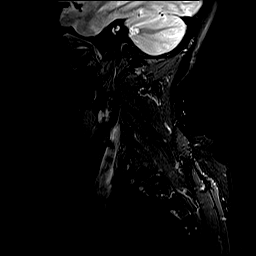
[im 3/18]
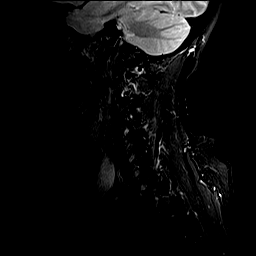
[im 6/18]
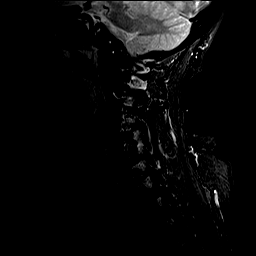
[im 9/18]
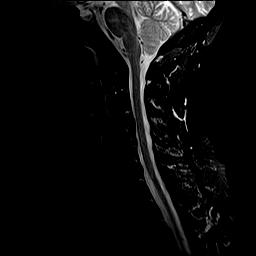
[im 12/18]
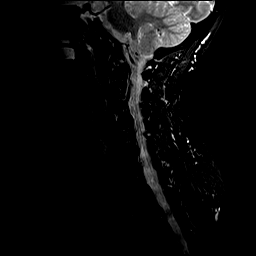
[im 15/18]
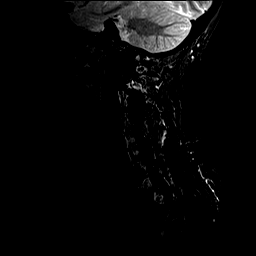
[im 18/18]
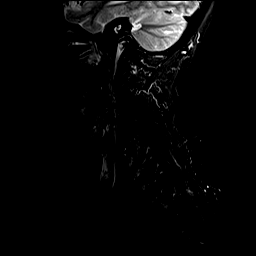

[Series 5: T1 · sagittal · 3.0mm · 0.82mm/px · 8 of 18 slices shown]
[im 1/18]
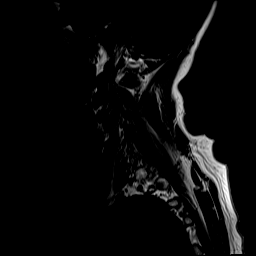
[im 3/18]
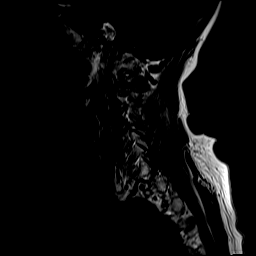
[im 5/18]
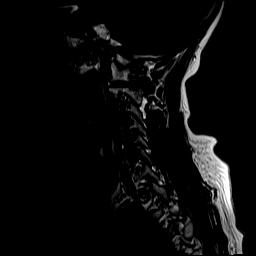
[im 8/18]
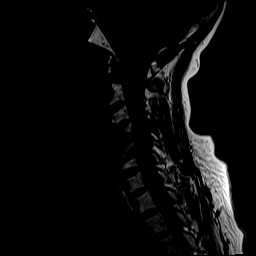
[im 10/18]
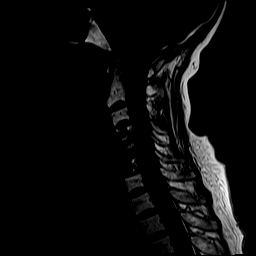
[im 13/18]
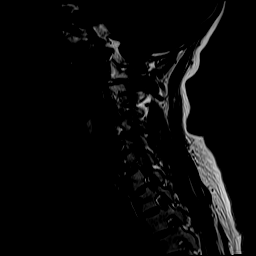
[im 15/18]
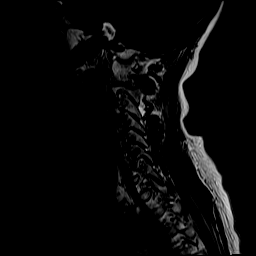
[im 18/18]
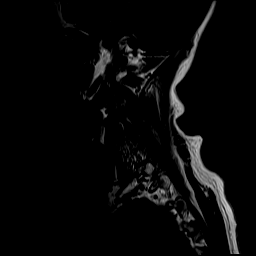

[Series 6: T2 · axial · 3.0mm · 0.70mm/px · z∈[-91,+18]mm · 9 of 30 slices shown (2 of 2)]
[im 1/30]
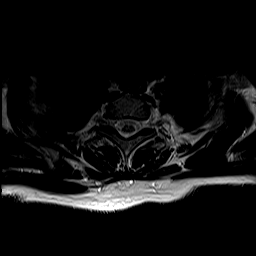
[im 5/30]
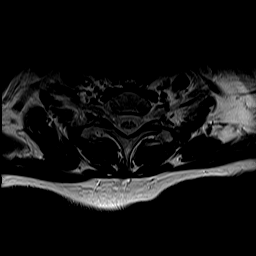
[im 10/30]
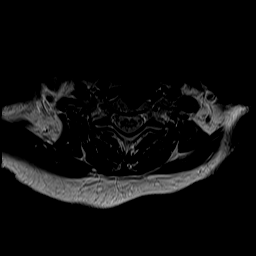
[im 13/30]
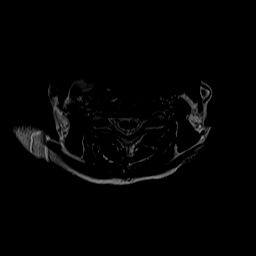
[im 15/30]
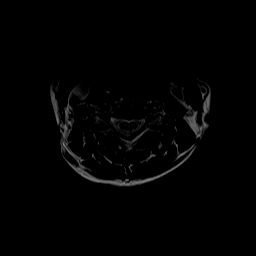
[im 17/30]
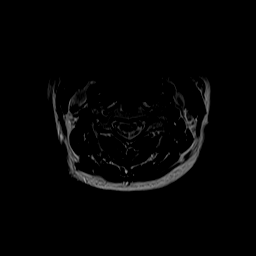
[im 20/30]
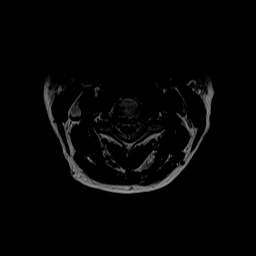
[im 25/30]
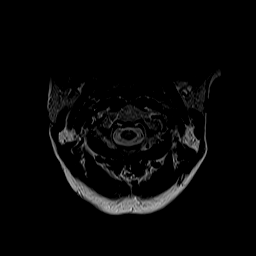
[im 30/30]
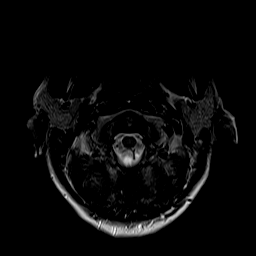

[Series 7: GRE · axial · 3.0mm · 0.35mm/px · z∈[-91,-31]mm · 5 of 30 slices shown]
[im 1/30]
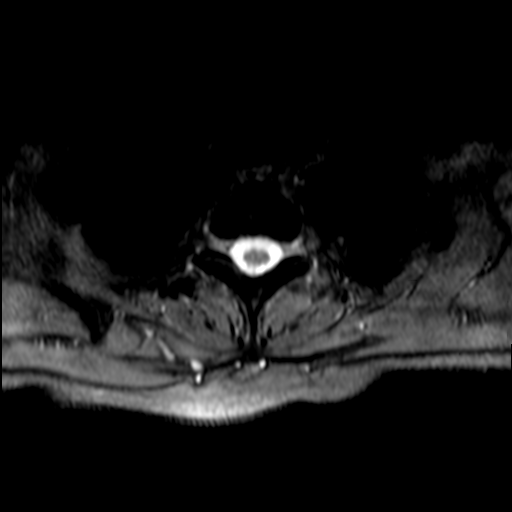
[im 5/30]
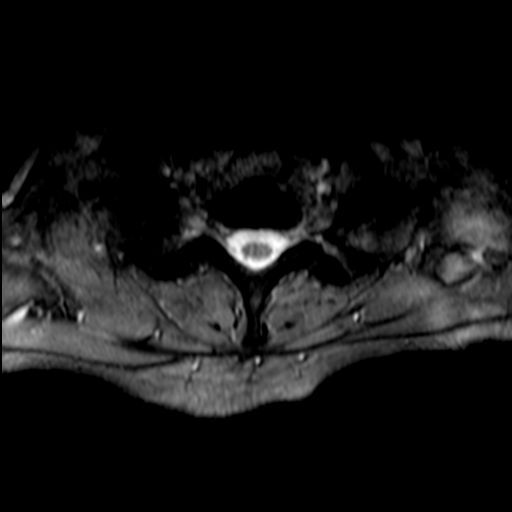
[im 10/30]
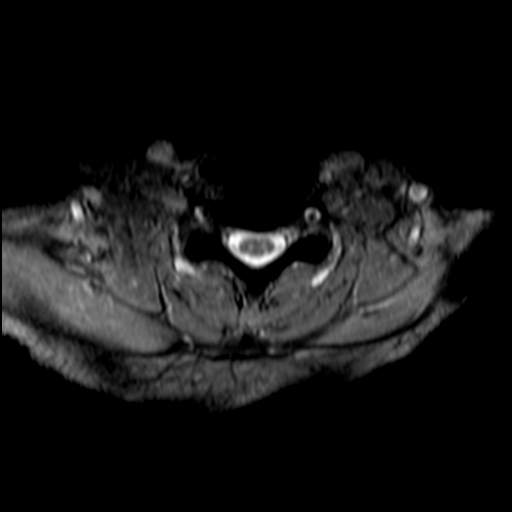
[im 13/30]
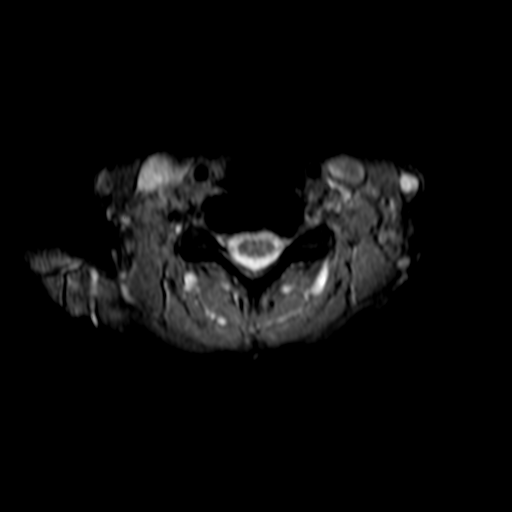
[im 17/30]
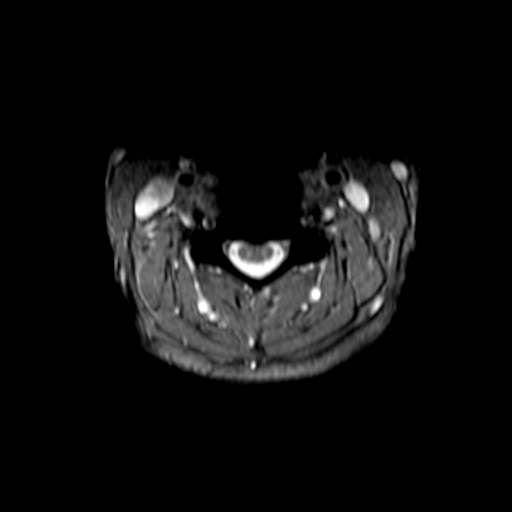

[36 of 48 positions shown; findings below may reference images not displayed]

FINDINGS: Alignment: Straightening of lordosis.

Vertebrae: Normal bone marrow signal intensity. No aggressive
osseous lesion. Sequela of C4-C6 ACDF. Associated susceptibility
artifact limits evaluation.

Cord: Normal signal and morphology.

Posterior Fossa, vertebral arteries: Negative.

Disc levels: Multilevel desiccation.

C2-3: No significant disc bulge. Facet degenerative spurring. Patent
spinal canal and neural foramen.

C3-4: Small disc osteophyte complex. Patent spinal canal and neural
foramen.

C4-5: Sequela of fusion.  Patent spinal canal and neural foramen.

C5-6: Sequela of fusion. Uncovertebral hypertrophy with moderate
left and mild right neural foraminal narrowing. Patent spinal canal.

C6-7: Shallow central protrusion with uncovertebral degenerative
spurring. Patent spinal canal and neural foramen.

C7-T1: No significant disc bulge. Patent spinal canal and neural
foramen.

Paraspinal tissues: Negative.
IMPRESSION: Sequela of C4-6 ACDF. Moderate left and mild right C5-6 neural
foraminal narrowing.

No significant spinal canal narrowing.

## 2021-10-08 DIAGNOSIS — M5416 Radiculopathy, lumbar region: Secondary | ICD-10-CM | POA: Diagnosis not present

## 2021-10-08 DIAGNOSIS — F1120 Opioid dependence, uncomplicated: Secondary | ICD-10-CM | POA: Diagnosis not present

## 2021-10-08 DIAGNOSIS — M542 Cervicalgia: Secondary | ICD-10-CM | POA: Diagnosis not present

## 2021-10-08 DIAGNOSIS — M5136 Other intervertebral disc degeneration, lumbar region: Secondary | ICD-10-CM | POA: Diagnosis not present

## 2021-10-16 ENCOUNTER — Telehealth: Payer: Self-pay

## 2021-10-16 NOTE — Patient Outreach (Signed)
  Care Coordination   10/16/2021 Name: Linda [name] MRN: [id number] DOB: 04/27/1950   Care Coordination Outreach Attempts:  An unsuccessful telephone outreach was attempted today to offer the patient information about available care coordination services as a benefit of their health plan.   Follow Up Plan:  Additional outreach attempts will be made to offer the patient care coordination information and services.   Encounter Outcome:  No Answer  Care Coordination Interventions Activated:  No   Care Coordination Interventions:  No, not indicated     Enzo Montgomery, RN,BSN,CCM [place] Telephonic Care Management Coordinator Direct Phone: [phone] Toll Free: [phone] Fax: [phone]

## 2021-10-20 ENCOUNTER — Telehealth: Payer: Self-pay

## 2021-10-20 NOTE — Patient Outreach (Signed)
  Care Coordination   10/20/2021 Name: Temima Kutsch MRN: [id number] DOB: 1950/04/24   Care Coordination Outreach Attempts:  A second unsuccessful outreach was attempted today to offer the patient with information about available care coordination services as a benefit of their health plan.     Follow Up Plan:  Additional outreach attempts will be made to offer the patient care coordination information and services.   Encounter Outcome:  No Answer  Care Coordination Interventions Activated:  No   Care Coordination Interventions:  No, not indicated      Enzo Montgomery, RN,BSN,CCM [facility] Telephonic Care Management Coordinator Direct Phone: [phone] Toll Free: [phone] Fax: [phone]

## 2021-10-29 ENCOUNTER — Telehealth: Payer: Self-pay

## 2021-10-29 ENCOUNTER — Other Ambulatory Visit: Payer: Self-pay | Admitting: Neurosurgery

## 2021-10-29 DIAGNOSIS — M5416 Radiculopathy, lumbar region: Secondary | ICD-10-CM

## 2021-10-29 NOTE — Patient Outreach (Signed)
  Care Coordination   Initial Visit Note   10/29/2021 Name: Linda [name] MRN: [id number] DOB: 02-21-1950  Linda [name] is a 71 y.o. year old female who sees Linda Lei, MD for primary care. I spoke with  Linda [name] by phone today.  What matters to the patients health and wellness today?  Patient reports that she is dealing with some hip pain. This has been ongoing for several months. She sees pain specialist. Her pain has been increased the past few weeks. She has pain meds in the home and using as prescribed. MD has ordered a MRI for further eval. Patient denies any recent falls/injuries. [facility] services reviewed and discussed. Patient denies any needs at this time. She has already completed Annual Wellness Visit-06-25-21. Patient also voices that she has gotten the COVID booster and flu vaccine this season already.     Goals Addressed             This Visit's Progress    COMPLETED: Care Coordination-no follow up required        Care Coordination Interventions: Provided education to patient re: [facility] services Assessed social determinant of health barriers          SDOH assessments and interventions completed:  Yes  SDOH Interventions Today    Flowsheet Row Most Recent Value  SDOH Interventions   Food Insecurity Interventions Intervention Not Indicated  Transportation Interventions Intervention Not Indicated        Care Coordination Interventions Activated:  Yes  Care Coordination Interventions:  Yes, provided   Follow up plan: No further intervention required.   Encounter Outcome:  Pt. Visit Completed    Enzo Montgomery, RN,BSN,CCM [place] [facility] Telephonic Care Management Coordinator Direct Phone: [phone] Toll Free: [phone] Fax: [phone]

## 2021-11-07 ENCOUNTER — Ambulatory Visit
Admission: RE | Admit: 2021-11-07 | Discharge: 2021-11-07 | Disposition: A | Discharge status: Home / Self Care | Payer: Medicare PPO | Source: Ambulatory Visit | Attending: Neurosurgery | Admitting: Neurosurgery

## 2021-11-07 DIAGNOSIS — M545 Low back pain, unspecified: Secondary | ICD-10-CM | POA: Diagnosis not present

## 2021-11-07 DIAGNOSIS — M5416 Radiculopathy, lumbar region: Secondary | ICD-10-CM

## 2021-11-07 DIAGNOSIS — M48061 Spinal stenosis, lumbar region without neurogenic claudication: Secondary | ICD-10-CM | POA: Diagnosis not present

## 2021-11-11 DIAGNOSIS — H35033 Hypertensive retinopathy, bilateral: Secondary | ICD-10-CM | POA: Diagnosis not present

## 2021-11-11 DIAGNOSIS — H2513 Age-related nuclear cataract, bilateral: Secondary | ICD-10-CM | POA: Diagnosis not present

## 2021-11-11 DIAGNOSIS — H401131 Primary open-angle glaucoma, bilateral, mild stage: Secondary | ICD-10-CM | POA: Diagnosis not present

## 2021-11-24 DIAGNOSIS — E1169 Type 2 diabetes mellitus with other specified complication: Secondary | ICD-10-CM | POA: Diagnosis not present

## 2021-11-24 DIAGNOSIS — I10 Essential (primary) hypertension: Secondary | ICD-10-CM | POA: Diagnosis not present

## 2021-11-24 DIAGNOSIS — E785 Hyperlipidemia, unspecified: Secondary | ICD-10-CM | POA: Diagnosis not present

## 2021-11-26 DIAGNOSIS — I10 Essential (primary) hypertension: Secondary | ICD-10-CM | POA: Diagnosis not present

## 2021-11-26 DIAGNOSIS — E1169 Type 2 diabetes mellitus with other specified complication: Secondary | ICD-10-CM | POA: Diagnosis not present

## 2021-11-28 DIAGNOSIS — M5416 Radiculopathy, lumbar region: Secondary | ICD-10-CM | POA: Diagnosis not present

## 2021-12-17 ENCOUNTER — Inpatient Hospital Stay: Payer: Medicare PPO | Admitting: Hematology & Oncology

## 2021-12-17 ENCOUNTER — Inpatient Hospital Stay: Payer: Medicare PPO | Attending: Hematology & Oncology

## 2021-12-26 DIAGNOSIS — Z20822 Contact with and (suspected) exposure to covid-19: Secondary | ICD-10-CM | POA: Diagnosis not present

## 2021-12-26 DIAGNOSIS — J3489 Other specified disorders of nose and nasal sinuses: Secondary | ICD-10-CM | POA: Diagnosis not present

## 2021-12-26 DIAGNOSIS — R030 Elevated blood-pressure reading, without diagnosis of hypertension: Secondary | ICD-10-CM | POA: Diagnosis not present

## 2021-12-26 NOTE — Progress Notes (Signed)
Office Visit Note  Patient: Linda [name]             Date of Birth: 1950/07/10           MRN: [id number]             PCP: Lucianne Lei, MD Referring: Lucianne Lei, MD Visit Date: 01/08/2022 Occupation: '@GUAROCC'$ @  Subjective:  Left knee pain  History of Present Illness: Linda [name] is a 71 y.o. female osteoarthritis, degenerative disc disease and fibromyalgia syndrome.  She continues to have pain and discomfort in her bilateral hands.  She states she has intermittent triggering of her right ring finger.  She continues to have bursitis in the trochanteric area comes and goes.  She complains of discomfort in her left knee off-and-on.  She states that she had a flare of lower back pain in October for which she was given a prednisone taper.  She had no response to prednisone taper.  She had a cortisone injection which relieved her pain.  She continues to have fatigue and generalized pain from fibromyalgia.  She uses Voltaren gel and Lidoderm patches as needed.  She still takes muscle relaxers for muscle spasm.  Activities of Daily Living:  Patient reports morning stiffness for 0 minutes.   Patient Denies nocturnal pain.  Difficulty dressing/grooming: Denies Difficulty climbing stairs: Reports Difficulty getting out of chair: Reports Difficulty using hands for taps, buttons, cutlery, and/or writing: Reports  Review of Systems  Constitutional:  Positive for fatigue.  HENT:  Positive for mouth dryness. Negative for mouth sores.   Eyes:  Negative for dryness.  Respiratory:  Negative for shortness of breath.   Cardiovascular:  Negative for chest pain and palpitations.  Gastrointestinal:  Positive for constipation. Negative for blood in stool and diarrhea.  Endocrine: Negative for increased urination.  Genitourinary:  Negative for involuntary urination.  Musculoskeletal:  Positive for joint pain, joint pain, myalgias, muscle weakness, muscle tenderness and myalgias. Negative for  gait problem, joint swelling and morning stiffness.  Skin:  Negative for color change, rash, hair loss and sensitivity to sunlight.  Allergic/Immunologic: Negative for susceptible to infections.  Neurological:  Positive for dizziness and headaches.  Hematological:  Negative for swollen glands.  Psychiatric/Behavioral:  Positive for depressed mood and sleep disturbance. The patient is nervous/anxious.     PMFS History:  Patient Active Problem List   Diagnosis Date Noted   Stage II infiltrating ductal breast carcinoma, ER-, left ([place]) 12/18/2019   Goals of care, counseling/discussion 12/18/2019   IDA (iron deficiency anemia) 12/19/2018   Fatigue 12/10/2015   Primary osteoarthritis of both hands 12/10/2015   Low back pain without sciatica 12/10/2015   Type 2 diabetes mellitus  12/10/2015   Depression 12/10/2015   Chronic pansinusitis 09/26/2015   Genetic testing 01/11/2014   Hives 11/11/2012   Chest pain 04/23/2012   Hyperlipidemia    Chronic insomnia 01/29/2011   Perennial allergic rhinitis 01/29/2011   Fibromyalgia 01/29/2011    Past Medical History:  Diagnosis Date   Allergic rhinitis    Arthritis    Breast cancer ([place]) 2002   left   Diabetes ([place])    Fibromyalgia    Glaucoma    Goals of care, counseling/discussion 12/18/2019   Hypercholesteremia    Hyperlipidemia    Personal history of chemotherapy 05/2000   left breast   Personal history of radiation therapy 2002   [place] breast   Stage II infiltrating ductal breast carcinoma, ER-, left ([place]) 12/18/2019  Family History  Problem Relation Age of Onset   Throat cancer Father    Breast cancer Maternal Aunt    CAD Mother    Stroke Mother    Breast cancer Daughter    Past Surgical History:  Procedure Laterality Date   APPENDECTOMY  1989   BREAST BIOPSY Left 04/26/2000   BREAST LUMPECTOMY Left 05/14/2000   left   CHOLECYSTECTOMY  1989   KNEE ARTHROSCOPY Right    NECK SURGERY  06/01/2019   SHOULDER SURGERY  Left    VAGINAL HYSTERECTOMY  1987   Social History   Social History Narrative   Not on file   Immunization History  Administered Date(s) Administered   Influenza, High Dose Seasonal PF 10/28/2017   Influenza,inj,Quad PF,6+ Mos 12/17/2014   Influenza-Unspecified 11/12/2015   PFIZER(Purple Top)SARS-COV-2 Vaccination 02/18/2019, 03/11/2019   Pneumococcal Conjugate-13 07/20/2016, 10/28/2017     Objective: Vital Signs: BP (!) 154/85 (BP Location: Right Arm, Patient Position: Sitting, Cuff Size: Normal)   Pulse 96   Resp 15   Ht '5\' 2"'$  (1.575 m)   Wt 141 lb (64 kg)   BMI 25.79 kg/m    Physical Exam Vitals and nursing note reviewed.  Constitutional:      Appearance: She is well-developed.  HENT:     Head: Normocephalic and atraumatic.  Eyes:     Conjunctiva/sclera: Conjunctivae normal.  Cardiovascular:     Rate and Rhythm: Normal rate and regular rhythm.     Heart sounds: Normal heart sounds.  Pulmonary:     Effort: Pulmonary effort is normal.     Breath sounds: Normal breath sounds.  Abdominal:     General: Bowel sounds are normal.     Palpations: Abdomen is soft.  Musculoskeletal:     Cervical back: Normal range of motion.  Lymphadenopathy:     Cervical: No cervical adenopathy.  Skin:    General: Skin is warm and dry.     Capillary Refill: Capillary refill takes less than 2 seconds.  Neurological:     Mental Status: She is alert and oriented to person, place, and time.  Psychiatric:        Behavior: Behavior normal.      Musculoskeletal Exam: She had limited range of motion of the cervical spine.  She had limited range of motion of the lumbar spine.  Shoulder joints, elbow joints, wrist joints were in good range of motion.  Bilateral mild PIP and DIP thickening was noted.  Knee joints were in good range of motion.  She had tenderness over bilateral trochanteric bursa.  She had crepitus in her knee joints without any warmth swelling or effusion.  There was no  tenderness over ankles or MTPs.  She had generalized hyperalgesia with positive tender points.  CDAI Exam: CDAI Score: -- Patient Global: --; Provider Global: -- Swollen: --; Tender: -- Joint Exam 01/08/2022   No joint exam has been documented for this visit   There is currently no information documented on the homunculus. Go to the Rheumatology activity and complete the homunculus joint exam.  Investigation: No additional findings.  Imaging: No results found.  Recent Labs: Lab Results  Component Value Date   WBC 5.8 12/16/2020   HGB 11.6 (L) 12/16/2020   PLT 254 12/16/2020   NA 140 12/16/2020   K 4.0 12/16/2020   CL 103 12/16/2020   CO2 30 12/16/2020   GLUCOSE 181 (H) 12/16/2020   BUN 16 12/16/2020   CREATININE 0.86 12/16/2020   BILITOT  0.3 12/16/2020   ALKPHOS 55 12/16/2020   AST 18 12/16/2020   ALT 11 12/16/2020   PROT 7.1 12/16/2020   ALBUMIN 4.4 12/16/2020   CALCIUM 9.9 12/16/2020   GFRAA >60 12/16/2018    Speciality Comments: No specialty comments available.  Procedures:  No procedures performed Allergies: Sulfa antibiotics   Assessment / Plan:     Visit Diagnoses: Primary osteoarthritis of both hands-she had bilateral PIP and DIP thickening with no synovitis.  Joint protection muscle strengthening was discussed.  Trigger finger, right ring finger-she has intermittent triggering.  She is currently not symptomatic.  Greater trochanteric bursitis of both hips-IT band stretches were discussed.  Primary osteoarthritis of both knees -she complains of intermittent discomfort in her knee joints.  She states the left knee joint causes more discomfort.  I handout on lower extremity muscle strengthening exercises was given.  X-rays from February 01, 2020 showed moderate osteoarthritis and moderate chondromalacia patella.  Trapezius muscle spasm-neck stretches were advised.  S/P cervical spinal fusion - May 2021 performed by Dr. Christella Noa.  She has limited lateral  rotation.  DDD (degenerative disc disease), lumbar-she has been asked Raynauds increased lower back pain.  Fibromyalgia-she has general lysed pain, hyperalgesia and positive tender points.  She is on Flexeril and gabapentin.  She also uses lidocaine patches and Voltaren gel as needed.  Chronic insomnia -she states that she gets relief from gabapentin 300 mg at bedtime.  Other fatigue-related to fibromyalgia and insomnia.  Osteoporosis, post-menopausal - DEXA ordered by PCP.  I do not have results available.  Patient will discuss DEXA results with her PCP.  Elevated blood pressure reading-her blood pressure was elevated at 152/86 today.  Repeat blood pressure was also elevated.  She was advised to monitor blood pressure closely and follow-up with her PCP.  Other medical problems are listed as follows:  History of diabetes mellitus  History of depression  History of hyperlipidemia  Orders: No orders of the defined types were placed in this encounter.  No orders of the defined types were placed in this encounter.    Follow-Up Instructions: Return in about 6 months (around 07/10/2022) for Osteoarthritis.   Bo Merino, MD  Note - This record has been created using Editor, commissioning.  Chart creation errors have been sought, but may not always  have been located. Such creation errors do not reflect on  the standard of medical care.

## 2021-12-30 ENCOUNTER — Other Ambulatory Visit: Payer: Self-pay | Admitting: Hematology & Oncology

## 2021-12-30 DIAGNOSIS — M542 Cervicalgia: Secondary | ICD-10-CM | POA: Diagnosis not present

## 2021-12-30 DIAGNOSIS — Z1231 Encounter for screening mammogram for malignant neoplasm of breast: Secondary | ICD-10-CM

## 2021-12-30 DIAGNOSIS — M5416 Radiculopathy, lumbar region: Secondary | ICD-10-CM | POA: Diagnosis not present

## 2021-12-30 DIAGNOSIS — M5136 Other intervertebral disc degeneration, lumbar region: Secondary | ICD-10-CM | POA: Diagnosis not present

## 2022-01-08 ENCOUNTER — Encounter: Payer: Self-pay | Admitting: Rheumatology

## 2022-01-08 ENCOUNTER — Ambulatory Visit: Payer: Medicare PPO | Attending: Rheumatology | Admitting: Rheumatology

## 2022-01-08 VITALS — BP 154/85 | HR 96 | Resp 15 | Ht 62.0 in | Wt 141.0 lb

## 2022-01-08 DIAGNOSIS — M19042 Primary osteoarthritis, left hand: Secondary | ICD-10-CM

## 2022-01-08 DIAGNOSIS — M25561 Pain in right knee: Secondary | ICD-10-CM

## 2022-01-08 DIAGNOSIS — M19041 Primary osteoarthritis, right hand: Secondary | ICD-10-CM

## 2022-01-08 DIAGNOSIS — Z8639 Personal history of other endocrine, nutritional and metabolic disease: Secondary | ICD-10-CM

## 2022-01-08 DIAGNOSIS — R5383 Other fatigue: Secondary | ICD-10-CM

## 2022-01-08 DIAGNOSIS — M810 Age-related osteoporosis without current pathological fracture: Secondary | ICD-10-CM

## 2022-01-08 DIAGNOSIS — R030 Elevated blood-pressure reading, without diagnosis of hypertension: Secondary | ICD-10-CM

## 2022-01-08 DIAGNOSIS — M65341 Trigger finger, right ring finger: Secondary | ICD-10-CM | POA: Diagnosis not present

## 2022-01-08 DIAGNOSIS — Z8659 Personal history of other mental and behavioral disorders: Secondary | ICD-10-CM

## 2022-01-08 DIAGNOSIS — M25562 Pain in left knee: Secondary | ICD-10-CM

## 2022-01-08 DIAGNOSIS — M170 Bilateral primary osteoarthritis of knee: Secondary | ICD-10-CM

## 2022-01-08 DIAGNOSIS — M5136 Other intervertebral disc degeneration, lumbar region: Secondary | ICD-10-CM

## 2022-01-08 DIAGNOSIS — M7061 Trochanteric bursitis, right hip: Secondary | ICD-10-CM | POA: Diagnosis not present

## 2022-01-08 DIAGNOSIS — M62838 Other muscle spasm: Secondary | ICD-10-CM

## 2022-01-08 DIAGNOSIS — F5104 Psychophysiologic insomnia: Secondary | ICD-10-CM | POA: Diagnosis not present

## 2022-01-08 DIAGNOSIS — Z981 Arthrodesis status: Secondary | ICD-10-CM

## 2022-01-08 DIAGNOSIS — M797 Fibromyalgia: Secondary | ICD-10-CM

## 2022-01-08 DIAGNOSIS — M7062 Trochanteric bursitis, left hip: Secondary | ICD-10-CM

## 2022-01-08 NOTE — Patient Instructions (Signed)

## 2022-01-09 ENCOUNTER — Other Ambulatory Visit: Payer: Self-pay | Admitting: *Deleted

## 2022-01-09 DIAGNOSIS — D500 Iron deficiency anemia secondary to blood loss (chronic): Secondary | ICD-10-CM

## 2022-01-09 DIAGNOSIS — C50912 Malignant neoplasm of unspecified site of left female breast: Secondary | ICD-10-CM

## 2022-01-11 ENCOUNTER — Other Ambulatory Visit: Payer: Self-pay | Admitting: Rheumatology

## 2022-01-12 ENCOUNTER — Other Ambulatory Visit: Payer: Self-pay

## 2022-01-12 ENCOUNTER — Inpatient Hospital Stay: Payer: Medicare PPO

## 2022-01-12 ENCOUNTER — Encounter: Payer: Self-pay | Admitting: Hematology & Oncology

## 2022-01-12 ENCOUNTER — Inpatient Hospital Stay: Payer: Medicare PPO | Attending: Hematology & Oncology | Admitting: Hematology & Oncology

## 2022-01-12 VITALS — BP 139/64 | HR 99 | Temp 99.2°F | Resp 16 | Ht 62.0 in | Wt 138.0 lb

## 2022-01-12 DIAGNOSIS — Z171 Estrogen receptor negative status [ER-]: Secondary | ICD-10-CM | POA: Diagnosis not present

## 2022-01-12 DIAGNOSIS — Z853 Personal history of malignant neoplasm of breast: Secondary | ICD-10-CM | POA: Diagnosis not present

## 2022-01-12 DIAGNOSIS — Z79899 Other long term (current) drug therapy: Secondary | ICD-10-CM | POA: Insufficient documentation

## 2022-01-12 DIAGNOSIS — C50912 Malignant neoplasm of unspecified site of left female breast: Secondary | ICD-10-CM

## 2022-01-12 DIAGNOSIS — D509 Iron deficiency anemia, unspecified: Secondary | ICD-10-CM | POA: Insufficient documentation

## 2022-01-12 DIAGNOSIS — D500 Iron deficiency anemia secondary to blood loss (chronic): Secondary | ICD-10-CM

## 2022-01-12 LAB — CMP (CANCER CENTER ONLY)
ALT: 19 U/L (ref 0–44)
AST: 19 U/L (ref 15–41)
Albumin: 4.6 g/dL (ref 3.5–5.0)
Alkaline Phosphatase: 58 U/L (ref 38–126)
Anion gap: 6 (ref 5–15)
BUN: 19 mg/dL (ref 8–23)
CO2: 32 mmol/L (ref 22–32)
Calcium: 10.1 mg/dL (ref 8.9–10.3)
Chloride: 102 mmol/L (ref 98–111)
Creatinine: 0.85 mg/dL (ref 0.44–1.00)
GFR, Estimated: >60 mL/min (ref >60)
Glucose, Bld: 95 mg/dL (ref 70–99)
Potassium: 4.5 mmol/L (ref 3.5–5.1)
Sodium: 140 mmol/L (ref 135–145)
Total Bilirubin: 0.3 mg/dL (ref 0.3–1.2)
Total Protein: 7.7 g/dL (ref 6.5–8.1)

## 2022-01-12 LAB — CBC WITH DIFFERENTIAL (CANCER CENTER ONLY)
Abs Immature Granulocytes: 0.01 10*3/uL (ref 0.00–0.07)
Basophils Absolute: 0.1 10*3/uL (ref 0.0–0.1)
Basophils Relative: 1 %
Eosinophils Absolute: 0.2 10*3/uL (ref 0.0–0.5)
Eosinophils Relative: 3 %
HCT: 37.5 % (ref 36.0–46.0)
Hemoglobin: 11.9 g/dL — ABNORMAL LOW (ref 12.0–15.0)
Immature Granulocytes: 0 %
Lymphocytes Relative: 47 %
Lymphs Abs: 3.8 10*3/uL (ref 0.7–4.0)
MCH: 27.3 pg (ref 26.0–34.0)
MCHC: 31.7 g/dL (ref 30.0–36.0)
MCV: 86 fL (ref 80.0–100.0)
Monocytes Absolute: 0.5 10*3/uL (ref 0.1–1.0)
Monocytes Relative: 6 %
Neutro Abs: 3.4 10*3/uL (ref 1.7–7.7)
Neutrophils Relative %: 43 %
Platelet Count: 300 10*3/uL (ref 150–400)
RBC: 4.36 MIL/uL (ref 3.87–5.11)
RDW: 14.8 % (ref 11.5–15.5)
WBC Count: 8 10*3/uL (ref 4.0–10.5)
nRBC: 0 % (ref 0.0–0.2)

## 2022-01-12 LAB — IRON AND IRON BINDING CAPACITY (CC-WL,HP ONLY)
Iron: 72 ug/dL (ref 28–170)
Saturation Ratios: 20 % (ref 10.4–31.8)
TIBC: 368 ug/dL (ref 250–450)
UIBC: 296 ug/dL (ref 148–442)

## 2022-01-12 LAB — FERRITIN: Ferritin: 45 ng/mL (ref 11–307)

## 2022-01-12 LAB — LACTATE DEHYDROGENASE: LDH: 179 U/L (ref 98–192)

## 2022-01-12 NOTE — Progress Notes (Signed)
Hematology and Oncology Follow Up Visit  Linda [name] [id number] January 22, 1951 71 y.o. 01/12/2022   Principle Diagnosis:  Stage IIA (T2 N0 M0) ductal carcinoma of the left breast triple-negative Iron deficiency anemia  Current Therapy:   IV iron as indicated-Venofer given on 02/01/2019   Interim History:  Linda [name] is here today for her annual follow-up.  Sounds like there is a lot going on in her life right now.  I think the big news is that she and her husband were down in [place] back in November.  He had seizures down there.  He has a history of seizures.  Apparently, he is not taking his seizure medication.  She has a mammogram set up for next month.  She has had no problems with nausea or vomiting.  She has had no cough or shortness of breath.  She has had no fever.  She has had no rashes.  There is been no bleeding.  Her last iron studies done a year ago showed a ferritin of 151 with iron saturation of 30%.  She has had no headache.  Overall, I would say performance status is probably ECOG 1.   Medications:  Allergies as of 01/12/2022       Reactions   Sulfa Antibiotics Rash, Hives        Medication List        Accurate as of January 12, 2022  1:10 PM. If you have any questions, ask your nurse or doctor.          buPROPion 300 MG 24 hr tablet Commonly known as: WELLBUTRIN XL Take 300 mg by mouth daily.   cyclobenzaprine 10 MG tablet Commonly known as: FLEXERIL Take 1 tablet by mouth 3 (three) times daily as needed.   cycloSPORINE 0.05 % ophthalmic emulsion Commonly known as: RESTASIS Place 1 drop into both eyes 2 (two) times daily.   diclofenac Sodium 1 % Gel Commonly known as: VOLTAREN APPLY 2-4 GRAMS TO AFFECTED JOINT 4 TIMES DAILY AS NEEDED.   gabapentin 300 MG capsule Commonly known as: NEURONTIN Take 300 mg by mouth at bedtime.   HYDROcodone-acetaminophen 5-325 MG tablet Commonly known as: NORCO/VICODIN Take 1 tablet by mouth every 8  (eight) hours as needed. for pain   Janumet XR [id number] MG Tb24 Generic drug: SitaGLIPtin-MetFORMIN HCl Take 1 tablet by mouth daily.   lidocaine 5 % Commonly known as: LIDODERM Place 1 patch onto the skin daily. Remove & Discard patch within 12 hours or as directed by MD   Lumigan 0.01 % Soln Generic drug: bimatoprost 1 DROP INTO BOTH EYES IN THE EVENING   rosuvastatin 10 MG tablet Commonly known as: CRESTOR Take 10 mg by mouth daily.   Vitamin D (Ergocalciferol) 1.25 MG (50000 UNIT) Caps capsule Commonly known as: DRISDOL TAKE 1 CAPSULE BY MOUTH ONE TIME PER WEEK        Allergies:  Allergies  Allergen Reactions   Sulfa Antibiotics Rash and Hives    Past Medical History, Surgical history, Social history, and Family History were reviewed and updated.  Review of Systems: Review of Systems  Constitutional: Negative.   HENT: Negative.    Eyes: Negative.   Respiratory: Negative.    Cardiovascular: Negative.   Gastrointestinal: Negative.   Genitourinary: Negative.   Musculoskeletal: Negative.   Skin: Negative.   Neurological: Negative.   Endo/Heme/Allergies: Negative.   Psychiatric/Behavioral: Negative.      Physical Exam:  height is '5\' 2"'$  (1.575 m) and weight is 138  lb (62.6 kg). Her oral temperature is 99.2 F (37.3 C). Her blood pressure is 139/64 and her pulse is 99. Her respiration is 16 and oxygen saturation is 100%.   Wt Readings from Last 3 Encounters:  01/12/22 138 lb (62.6 kg)  01/08/22 141 lb (64 kg)  07/15/21 141 lb (64 kg)    Physical Exam Vitals reviewed.  Constitutional:      Comments: On her breast exam, her right breast shows no masses, edema or erythema.  There is no right axillary adenopathy.  Left breast is slightly contracted from past surgery and radiation.  She has a well-healed lumpectomy scar at the 2 o'clock position.  She has no distinct mass in the left breast.  There is no left axillary adenopathy.  HENT:     Head: Normocephalic  and atraumatic.  Eyes:     Pupils: Pupils are equal, round, and reactive to light.  Cardiovascular:     Rate and Rhythm: Normal rate and regular rhythm.     Heart sounds: Normal heart sounds.  Pulmonary:     Effort: Pulmonary effort is normal.     Breath sounds: Normal breath sounds.  Abdominal:     General: Bowel sounds are normal.     Palpations: Abdomen is soft.  Musculoskeletal:        General: No tenderness or deformity. Normal range of motion.     Cervical back: Normal range of motion.  Lymphadenopathy:     Cervical: No cervical adenopathy.  Skin:    General: Skin is warm and dry.     Findings: No erythema or rash.  Neurological:     Mental Status: She is alert and oriented to person, place, and time.  Psychiatric:        Behavior: Behavior normal.        Thought Content: Thought content normal.        Judgment: Judgment normal.      Lab Results  Component Value Date   WBC 8.0 01/12/2022   HGB 11.9 (L) 01/12/2022   HCT 37.5 01/12/2022   MCV 86.0 01/12/2022   PLT 300 01/12/2022   Lab Results  Component Value Date   FERRITIN 151 12/18/2019   IRON 90 12/18/2019   TIBC 301 12/18/2019   UIBC 211 12/18/2019   IRONPCTSAT 30 12/18/2019   Lab Results  Component Value Date   RETICCTPCT 1.3 04/23/2012   RBC 4.36 01/12/2022   No results found for: "KPAFRELGTCHN", "LAMBDASER", "KAPLAMBRATIO" No results found for: "IGGSERUM", "IGA", "IGMSERUM" No results found for: "TOTALPROTELP", "ALBUMINELP", "A1GS", "A2GS", "BETS", "BETA2SER", "GAMS", "MSPIKE", "SPEI"   Chemistry      Component Value Date/Time   NA 140 01/12/2022 1222   NA 139 12/11/2016 1252   NA 138 12/13/2015 1352   K 4.5 01/12/2022 1222   K 3.9 12/11/2016 1252   K 4.1 12/13/2015 1352   CL 102 01/12/2022 1222   CL 104 12/11/2016 1252   CO2 32 01/12/2022 1222   CO2 28 12/11/2016 1252   CO2 23 12/13/2015 1352   BUN 19 01/12/2022 1222   BUN 14 12/11/2016 1252   BUN 17.2 12/13/2015 1352   CREATININE  0.85 01/12/2022 1222   CREATININE 0.72 12/11/2016 1252   CREATININE 0.8 12/13/2015 1352      Component Value Date/Time   CALCIUM 10.1 01/12/2022 1222   CALCIUM 9.6 12/11/2016 1252   CALCIUM 9.7 12/13/2015 1352   ALKPHOS 58 01/12/2022 1222   ALKPHOS 80 12/11/2016 1252  ALKPHOS 79 12/13/2015 1352   AST 19 01/12/2022 1222   AST 14 12/13/2015 1352   ALT 19 01/12/2022 1222   ALT 13 12/13/2015 1352   BILITOT 0.3 01/12/2022 1222   BILITOT <0.22 12/13/2015 1352      Impression and Plan: Ms. Casares is a very pleasant 71 yo African American female with history of stage IIa (T2N0M0) ductal carcinoma of the left breast, triple negative. She was diagnosed and treated over 18 yrs ago with lumpectomy and chemotherapy (Adriamycin/Cytoxan).   Again I do not see any evidence of recurrent disease.  I have believe that she is cured given that she has triple negative disease.  We will see what her iron levels look like.  I would have to believe that they are going to be okay.  As always, we will get her back in 1 more year.   Volanda Napoleon, MD 12/18/20231:10 PM

## 2022-01-12 NOTE — Telephone Encounter (Signed)
Next Visit: 07/09/2022  Last Visit: 01/08/2022  Last Fill: 01/14/2021  Dx: Primary osteoarthritis of both knees   Current Dose per office note on 01/08/2022: not discussed  Okay to refill Voltaren Gel?

## 2022-01-13 ENCOUNTER — Telehealth: Payer: Self-pay

## 2022-01-13 NOTE — Telephone Encounter (Signed)
-----   Message from Volanda Napoleon, MD sent at 01/12/2022  5:10 PM EST ----- Please call and let her know that the iron level is okay.  Thanks.  Laurey Arrow

## 2022-01-13 NOTE — Telephone Encounter (Signed)
Advised via MyChart.

## 2022-02-24 ENCOUNTER — Ambulatory Visit
Admission: RE | Admit: 2022-02-24 | Discharge: 2022-02-24 | Disposition: A | Discharge status: Home / Self Care | Payer: Medicare PPO | Source: Ambulatory Visit | Attending: Hematology & Oncology | Admitting: Hematology & Oncology

## 2022-02-24 DIAGNOSIS — Z1231 Encounter for screening mammogram for malignant neoplasm of breast: Secondary | ICD-10-CM

## 2022-03-30 DIAGNOSIS — M5416 Radiculopathy, lumbar region: Secondary | ICD-10-CM | POA: Diagnosis not present

## 2022-03-30 DIAGNOSIS — M5136 Other intervertebral disc degeneration, lumbar region: Secondary | ICD-10-CM | POA: Diagnosis not present

## 2022-03-30 DIAGNOSIS — M130 Polyarthritis, unspecified: Secondary | ICD-10-CM | POA: Diagnosis not present

## 2022-03-30 DIAGNOSIS — I10 Essential (primary) hypertension: Secondary | ICD-10-CM | POA: Diagnosis not present

## 2022-03-30 DIAGNOSIS — F1120 Opioid dependence, uncomplicated: Secondary | ICD-10-CM | POA: Diagnosis not present

## 2022-03-30 DIAGNOSIS — M542 Cervicalgia: Secondary | ICD-10-CM | POA: Diagnosis not present

## 2022-03-30 DIAGNOSIS — E785 Hyperlipidemia, unspecified: Secondary | ICD-10-CM | POA: Diagnosis not present

## 2022-03-30 DIAGNOSIS — K5900 Constipation, unspecified: Secondary | ICD-10-CM | POA: Diagnosis not present

## 2022-03-30 DIAGNOSIS — E1169 Type 2 diabetes mellitus with other specified complication: Secondary | ICD-10-CM | POA: Diagnosis not present

## 2022-04-02 DIAGNOSIS — I10 Essential (primary) hypertension: Secondary | ICD-10-CM | POA: Diagnosis not present

## 2022-04-17 DIAGNOSIS — M5416 Radiculopathy, lumbar region: Secondary | ICD-10-CM | POA: Diagnosis not present

## 2022-05-12 DIAGNOSIS — H2513 Age-related nuclear cataract, bilateral: Secondary | ICD-10-CM | POA: Diagnosis not present

## 2022-05-12 DIAGNOSIS — H401131 Primary open-angle glaucoma, bilateral, mild stage: Secondary | ICD-10-CM | POA: Diagnosis not present

## 2022-05-12 DIAGNOSIS — H35033 Hypertensive retinopathy, bilateral: Secondary | ICD-10-CM | POA: Diagnosis not present

## 2022-05-28 DIAGNOSIS — F1120 Opioid dependence, uncomplicated: Secondary | ICD-10-CM | POA: Diagnosis not present

## 2022-05-28 DIAGNOSIS — M542 Cervicalgia: Secondary | ICD-10-CM | POA: Diagnosis not present

## 2022-05-28 DIAGNOSIS — M5136 Other intervertebral disc degeneration, lumbar region: Secondary | ICD-10-CM | POA: Diagnosis not present

## 2022-05-28 DIAGNOSIS — Z6825 Body mass index (BMI) 25.0-25.9, adult: Secondary | ICD-10-CM | POA: Diagnosis not present

## 2022-06-25 NOTE — Progress Notes (Signed)
Office Visit Note  Patient: Linda [name]             Date of Birth: 20-Feb-1950           MRN: [id number]             PCP: Renaye Rakers, MD Referring: Renaye Rakers, MD Visit Date: 07/09/2022 Occupation: @GUAROCC @  Subjective:  Arthralgias  History of Present Illness: Linda [name] is a 72 y.o. female with history of osteoarthritis, fibromyalgia, and DDD.  Patient reports that she remains under the care of pain management.  She states that she has been switched from Norco to tramadol which has been beneficial at better controlling her pain level.  She continues to have generalized allergies and muscle tenderness due to fibromyalgia.  She takes Flexeril 10 mg 1 tablet 3 times daily as needed for muscle spasms.  Patient states she has had some increased discomfort in both knee joints intermittently.  She states that she has been wearing a compression sleeve on her right knee while ambulating which has been helpful.  She has occasional pain in the left knee at night and describes it as an aching sensation.  She has a prescription for Voltaren gel but has not been using it as frequently.  Patient states that she has had injections in her lower back in the past which have been helpful.  She denies any other new concerns at this time.   Activities of Daily Living:  Patient reports morning stiffness for 0 minutes.   Patient Reports nocturnal pain.  Difficulty dressing/grooming: Denies Difficulty climbing stairs: Reports Difficulty getting out of chair: Reports Difficulty using hands for taps, buttons, cutlery, and/or writing: Reports  Review of Systems  Constitutional:  Positive for fatigue.  HENT:  Positive for mouth dryness. Negative for mouth sores.   Eyes:  Positive for dryness.  Respiratory:  Negative for shortness of breath.   Cardiovascular:  Negative for chest pain and palpitations.  Gastrointestinal:  Positive for constipation and diarrhea. Negative for blood in stool.   Endocrine: Negative for increased urination.  Genitourinary:  Negative for involuntary urination.  Musculoskeletal:  Positive for joint pain, gait problem, joint pain, myalgias, muscle weakness, muscle tenderness and myalgias. Negative for joint swelling and morning stiffness.  Skin:  Positive for hair loss. Negative for color change, rash and sensitivity to sunlight.  Allergic/Immunologic: Negative for susceptible to infections.  Neurological:  Positive for headaches. Negative for dizziness.  Hematological:  Negative for swollen glands.  Psychiatric/Behavioral:  Positive for depressed mood. Negative for sleep disturbance. The patient is nervous/anxious.     PMFS History:  Patient Active Problem List   Diagnosis Date Noted   Stage II infiltrating ductal breast carcinoma, ER-, left (HCC) 12/18/2019   Goals of care, counseling/discussion 12/18/2019   IDA (iron deficiency anemia) 12/19/2018   Fatigue 12/10/2015   Primary osteoarthritis of both hands 12/10/2015   Low back pain without sciatica 12/10/2015   Type 2 diabetes mellitus  12/10/2015   Depression 12/10/2015   Chronic pansinusitis 09/26/2015   Genetic testing 01/11/2014   Hives 11/11/2012   Chest pain 04/23/2012   Hyperlipidemia    Chronic insomnia 01/29/2011   Perennial allergic rhinitis 01/29/2011   Fibromyalgia 01/29/2011    Past Medical History:  Diagnosis Date   Allergic rhinitis    Arthritis    Breast cancer (HCC) 2002   left   Diabetes (HCC)    Fibromyalgia    Glaucoma    Goals of care,  counseling/discussion 12/18/2019   Hypercholesteremia    Hyperlipidemia    Personal history of chemotherapy 05/2000   left breast   Personal history of radiation therapy 2002   Letf breast   Stage II infiltrating ductal breast carcinoma, ER-, left (HCC) 12/18/2019    Family History  Problem Relation Age of Onset   Throat cancer Father    Breast cancer Maternal Aunt    CAD Mother    Stroke Mother    Breast cancer  Daughter    Past Surgical History:  Procedure Laterality Date   APPENDECTOMY  1989   BREAST BIOPSY Left 04/26/2000   BREAST LUMPECTOMY Left 05/14/2000   left   CHOLECYSTECTOMY  1989   KNEE ARTHROSCOPY Right    NECK SURGERY  06/01/2019   SHOULDER SURGERY Left    VAGINAL HYSTERECTOMY  1987   Social History   Social History Narrative   Not on file   Immunization History  Administered Date(s) Administered   Influenza, High Dose Seasonal PF 10/28/2017   Influenza,inj,Quad PF,6+ Mos 12/17/2014   Influenza-Unspecified 11/12/2015   PFIZER(Purple Top)SARS-COV-2 Vaccination 02/18/2019, 03/11/2019   Pneumococcal Conjugate-13 07/20/2016, 10/28/2017     Objective: Vital Signs: BP 133/79 (BP Location: Left Arm, Patient Position: Sitting, Cuff Size: Normal)   Pulse (!) 103   Resp 17   Ht 5\' 2"  (1.575 m)   Wt 140 lb 3.2 oz (63.6 kg)   BMI 25.64 kg/m    Physical Exam Vitals and nursing note reviewed.  Constitutional:      Appearance: She is well-developed.  HENT:     Head: Normocephalic and atraumatic.  Eyes:     Conjunctiva/sclera: Conjunctivae normal.  Cardiovascular:     Rate and Rhythm: Normal rate and regular rhythm.     Heart sounds: Normal heart sounds.  Pulmonary:     Effort: Pulmonary effort is normal.     Breath sounds: Normal breath sounds.  Abdominal:     General: Bowel sounds are normal.     Palpations: Abdomen is soft.  Musculoskeletal:     Cervical back: Normal range of motion.  Lymphadenopathy:     Cervical: No cervical adenopathy.  Skin:    General: Skin is warm and dry.     Capillary Refill: Capillary refill takes less than 2 seconds.  Neurological:     Mental Status: She is alert and oriented to person, place, and time.  Psychiatric:        Behavior: Behavior normal.      Musculoskeletal Exam: C-spine has slightly limited range of motion without rotation.  Trapezius muscle tension and tenderness bilaterally, right greater than left.  Painful range  of motion of the lumbar spine.  Shoulder joints have good range of motion with some discomfort and stiffness bilaterally.  Elbow joints, wrist joints, MCPs, PIPs, DIPs have good range of motion with no synovitis.  Complete fist formation bilaterally.  Hip joints have good range of motion with mild groin pain bilaterally.  Tenderness over bilateral trochanteric bursa.  Knee joints have good range of motion with some discomfort but no warmth or effusion.  Ankle joints have good range of motion with no tenderness or joint swelling.  CDAI Exam: CDAI Score: -- Patient Global: --; Provider Global: -- Swollen: --; Tender: -- Joint Exam 07/09/2022   No joint exam has been documented for this visit   There is currently no information documented on the homunculus. Go to the Rheumatology activity and complete the homunculus joint exam.  Investigation: No  additional findings.  Imaging: No results found.  Recent Labs: Lab Results  Component Value Date   WBC 8.0 01/12/2022   HGB 11.9 (L) 01/12/2022   PLT 300 01/12/2022   NA 140 01/12/2022   K 4.5 01/12/2022   CL 102 01/12/2022   CO2 32 01/12/2022   GLUCOSE 95 01/12/2022   BUN 19 01/12/2022   CREATININE 0.85 01/12/2022   BILITOT 0.3 01/12/2022   ALKPHOS 58 01/12/2022   AST 19 01/12/2022   ALT 19 01/12/2022   PROT 7.7 01/12/2022   ALBUMIN 4.6 01/12/2022   CALCIUM 10.1 01/12/2022   GFRAA >60 12/16/2018    Speciality Comments: No specialty comments available.  Procedures:  No procedures performed Allergies: Sulfa antibiotics   Assessment / Plan:     Visit Diagnoses: Primary osteoarthritis of both hands: She has some PIP and DIP thickening consistent with osteoarthritis of both hands.  No tenderness or synovitis noted.  Complete fist formation noted bilaterally.  Discussed the importance of joint protection and muscle strengthening.  Trigger finger, right ring finger: Currently asymptomatic.  Greater trochanteric bursitis of both  hips: She has tenderness over bilateral trochanteric bursa.  Encouraged patient to perform stretching exercises daily.  Primary osteoarthritis of both knees: She has good range of motion of both knee joints on examination today.  No warmth or effusion noted.  She has been experiencing intermittent discomfort in both knees especially the left knee at night.  She has had some instability in the right knee at times and has started to wear compression sleeve while ambulating which has been helpful.  She was advised to notify us if her symptoms persist or worsen at which time we can update x-rays of both knees.  She has not had a knee joint cortisone injection performed recently.  Overall her pain level has been manageable with the use of tramadol.  She has not needed to use Voltaren gel topically recently.  Trapezius muscle spasm: She has trapezius muscle tension and tenderness bilaterally, right greater than left.  She takes Flexeril 10 mg 1 tablet 3 times daily as needed for muscle spasms.  S/P cervical spinal fusion - May 2021 performed by Dr. Franky Macho.  Slightly limited range of motion with lateral rotation.   DDD (degenerative disc disease), lumbar: Chronic pain.  Under the care of pain management.  Her lower back pain has been more tolerable than switching from Norco to tramadol.  She uses Lidoderm patches as needed for pain relief.  Fibromyalgia: She continues to experience intermittent myalgias and muscle tenderness due to fibromyalgia.  She takes Flexeril 10 mg 3 times daily as needed for muscle spasms.  She is under the care of pain management and has been switched from Norco to tramadol which has provided a significant benefit in her pain level day-to-day.  Discussed the importance of regular exercise and good sleep hygiene.  Chronic insomnia: Discussed importance of good sleep hygiene.  Other fatigue: Chronic, stable.  Discussed the importance of regular exercise.  Other medical conditions are  listed as follows:  Osteoporosis, post-menopausal: Followed by Dr. Myna Hidalgo.  Patient is taking vitamin D 50,000 units once weekly.  History of diabetes mellitus  History of depression  History of hyperlipidemia  Orders: No orders of the defined types were placed in this encounter.  No orders of the defined types were placed in this encounter.    Follow-Up Instructions: Return in about 6 months (around 01/08/2023) for Osteoarthritis, Fibromyalgia, DDD.   Linda Bienenstock, PA-C  Note - This record has been created using Dragon software.  Chart creation errors have been sought, but may not always  have been located. Such creation errors do not reflect on  the standard of medical care.  

## 2022-07-09 ENCOUNTER — Encounter: Payer: Self-pay | Admitting: Physician Assistant

## 2022-07-09 ENCOUNTER — Ambulatory Visit: Payer: Medicare PPO | Attending: Physician Assistant | Admitting: Physician Assistant

## 2022-07-09 VITALS — BP 133/79 | HR 103 | Resp 17 | Ht 62.0 in | Wt 140.2 lb

## 2022-07-09 DIAGNOSIS — M62838 Other muscle spasm: Secondary | ICD-10-CM | POA: Diagnosis not present

## 2022-07-09 DIAGNOSIS — F5104 Psychophysiologic insomnia: Secondary | ICD-10-CM | POA: Diagnosis not present

## 2022-07-09 DIAGNOSIS — M19041 Primary osteoarthritis, right hand: Secondary | ICD-10-CM

## 2022-07-09 DIAGNOSIS — M797 Fibromyalgia: Secondary | ICD-10-CM | POA: Diagnosis not present

## 2022-07-09 DIAGNOSIS — M810 Age-related osteoporosis without current pathological fracture: Secondary | ICD-10-CM

## 2022-07-09 DIAGNOSIS — M170 Bilateral primary osteoarthritis of knee: Secondary | ICD-10-CM | POA: Diagnosis not present

## 2022-07-09 DIAGNOSIS — Z981 Arthrodesis status: Secondary | ICD-10-CM

## 2022-07-09 DIAGNOSIS — M51369 Other intervertebral disc degeneration, lumbar region without mention of lumbar back pain or lower extremity pain: Secondary | ICD-10-CM

## 2022-07-09 DIAGNOSIS — R5383 Other fatigue: Secondary | ICD-10-CM

## 2022-07-09 DIAGNOSIS — M65341 Trigger finger, right ring finger: Secondary | ICD-10-CM

## 2022-07-09 DIAGNOSIS — M7061 Trochanteric bursitis, right hip: Secondary | ICD-10-CM

## 2022-07-09 DIAGNOSIS — M5136 Other intervertebral disc degeneration, lumbar region: Secondary | ICD-10-CM

## 2022-07-09 DIAGNOSIS — Z8639 Personal history of other endocrine, nutritional and metabolic disease: Secondary | ICD-10-CM

## 2022-07-09 DIAGNOSIS — Z8659 Personal history of other mental and behavioral disorders: Secondary | ICD-10-CM

## 2022-07-09 DIAGNOSIS — M19042 Primary osteoarthritis, left hand: Secondary | ICD-10-CM

## 2022-07-09 DIAGNOSIS — M7062 Trochanteric bursitis, left hip: Secondary | ICD-10-CM

## 2022-07-22 DIAGNOSIS — E1169 Type 2 diabetes mellitus with other specified complication: Secondary | ICD-10-CM | POA: Diagnosis not present

## 2022-07-22 DIAGNOSIS — E785 Hyperlipidemia, unspecified: Secondary | ICD-10-CM | POA: Diagnosis not present

## 2022-07-22 DIAGNOSIS — I10 Essential (primary) hypertension: Secondary | ICD-10-CM | POA: Diagnosis not present

## 2022-07-23 DIAGNOSIS — I10 Essential (primary) hypertension: Secondary | ICD-10-CM | POA: Diagnosis not present

## 2022-07-23 DIAGNOSIS — Z0000 Encounter for general adult medical examination without abnormal findings: Secondary | ICD-10-CM | POA: Diagnosis not present

## 2022-07-28 DIAGNOSIS — R140 Abdominal distension (gaseous): Secondary | ICD-10-CM | POA: Diagnosis not present

## 2022-07-28 DIAGNOSIS — Z1211 Encounter for screening for malignant neoplasm of colon: Secondary | ICD-10-CM | POA: Diagnosis not present

## 2022-07-28 DIAGNOSIS — K5904 Chronic idiopathic constipation: Secondary | ICD-10-CM | POA: Diagnosis not present

## 2022-07-28 DIAGNOSIS — K219 Gastro-esophageal reflux disease without esophagitis: Secondary | ICD-10-CM | POA: Diagnosis not present

## 2022-08-12 DIAGNOSIS — H35033 Hypertensive retinopathy, bilateral: Secondary | ICD-10-CM | POA: Diagnosis not present

## 2022-08-12 DIAGNOSIS — H2513 Age-related nuclear cataract, bilateral: Secondary | ICD-10-CM | POA: Diagnosis not present

## 2022-08-12 DIAGNOSIS — E119 Type 2 diabetes mellitus without complications: Secondary | ICD-10-CM | POA: Diagnosis not present

## 2022-08-12 DIAGNOSIS — H401131 Primary open-angle glaucoma, bilateral, mild stage: Secondary | ICD-10-CM | POA: Diagnosis not present

## 2022-08-17 DIAGNOSIS — E1169 Type 2 diabetes mellitus with other specified complication: Secondary | ICD-10-CM | POA: Diagnosis not present

## 2022-08-17 DIAGNOSIS — F064 Anxiety disorder due to known physiological condition: Secondary | ICD-10-CM | POA: Diagnosis not present

## 2022-08-17 DIAGNOSIS — Z1211 Encounter for screening for malignant neoplasm of colon: Secondary | ICD-10-CM | POA: Diagnosis not present

## 2022-08-17 DIAGNOSIS — I10 Essential (primary) hypertension: Secondary | ICD-10-CM | POA: Diagnosis not present

## 2022-08-17 DIAGNOSIS — Z1212 Encounter for screening for malignant neoplasm of rectum: Secondary | ICD-10-CM | POA: Diagnosis not present

## 2022-08-17 DIAGNOSIS — M797 Fibromyalgia: Secondary | ICD-10-CM | POA: Diagnosis not present

## 2022-08-21 LAB — COLOGUARD: COLOGUARD: NEGATIVE

## 2022-09-02 DIAGNOSIS — F1120 Opioid dependence, uncomplicated: Secondary | ICD-10-CM | POA: Diagnosis not present

## 2022-09-02 DIAGNOSIS — M5136 Other intervertebral disc degeneration, lumbar region: Secondary | ICD-10-CM | POA: Diagnosis not present

## 2022-09-02 DIAGNOSIS — M542 Cervicalgia: Secondary | ICD-10-CM | POA: Diagnosis not present

## 2022-09-02 DIAGNOSIS — M5416 Radiculopathy, lumbar region: Secondary | ICD-10-CM | POA: Diagnosis not present

## 2022-09-04 DIAGNOSIS — H25013 Cortical age-related cataract, bilateral: Secondary | ICD-10-CM | POA: Diagnosis not present

## 2022-09-04 DIAGNOSIS — H2513 Age-related nuclear cataract, bilateral: Secondary | ICD-10-CM | POA: Diagnosis not present

## 2022-09-18 DIAGNOSIS — E1169 Type 2 diabetes mellitus with other specified complication: Secondary | ICD-10-CM | POA: Diagnosis not present

## 2022-09-21 NOTE — Therapy (Signed)
OUTPATIENT PHYSICAL THERAPY THORACOLUMBAR EVALUATION   Patient Name: Linda [name] MRN: [id number] DOB:03/12/1950, 72 y.o., female Today's Date: 09/22/2022  END OF SESSION:  PT End of Session - 09/22/22 1035     Visit Number 1    Date for PT Re-Evaluation 11/17/22    Authorization Type [employer] M[employer]    Authorization Time Period pending authorization    Progress Note Due on Visit 10    PT Start Time 0940    PT Stop Time 1030    PT Time Calculation (min) 50 min             Past Medical History:  Diagnosis Date   Allergic rhinitis    Arthritis    Breast cancer (HCC) 2002   left   Diabetes (HCC)    Fibromyalgia    Glaucoma    Goals of care, counseling/discussion 12/18/2019   Hypercholesteremia    Hyperlipidemia    Personal history of chemotherapy 05/2000   left breast   Personal history of radiation therapy 2002   Letf breast   Stage II infiltrating ductal breast carcinoma, ER-, left (HCC) 12/18/2019   Past Surgical History:  Procedure Laterality Date   APPENDECTOMY  1989   BREAST BIOPSY Left 04/26/2000   BREAST LUMPECTOMY Left 05/14/2000   left   CHOLECYSTECTOMY  1989   KNEE ARTHROSCOPY Right    NECK SURGERY  06/01/2019   SHOULDER SURGERY Left    VAGINAL HYSTERECTOMY  1987   Patient Active Problem List   Diagnosis Date Noted   Stage II infiltrating ductal breast carcinoma, ER-, left (HCC) 12/18/2019   Goals of care, counseling/discussion 12/18/2019   IDA (iron deficiency anemia) 12/19/2018   Fatigue 12/10/2015   Primary osteoarthritis of both hands 12/10/2015   Low back pain without sciatica 12/10/2015   Type 2 diabetes mellitus  12/10/2015   Depression 12/10/2015   Chronic pansinusitis 09/26/2015   Genetic testing 01/11/2014   Hives 11/11/2012   Chest pain 04/23/2012   Hyperlipidemia    Chronic insomnia 01/29/2011   Perennial allergic rhinitis 01/29/2011   Fibromyalgia 01/29/2011    PCP: Renaye Rakers, MD   REFERRING PROVIDER: Raylene Miyamoto, PA-C   REFERRING DIAG: M51.36 (ICD-10-CM) - DDD (degenerative disc disease), lumbar R26.9 (ICD-10-CM) - Gait abnormality  Rationale for Evaluation and Treatment: Rehabilitation  THERAPY DIAG:  Other abnormalities of gait and mobility  Muscle weakness (generalized)  Other low back pain  ONSET DATE: ongoing chronic, got worse over last few months.   SUBJECTIVE:  SUBJECTIVE STATEMENT: "I'm here for balance.  I had polio when I was young, so I have one leg longer than the other.  When I trip I can't catch myself. "  She doesn't pick her R foot up and has poor use of her L leg.  She wore braces when she was young till she was 40, until she had surgery to stabilize her ankle.  She has a bulging disk in her back, has been going to pain management.  Has never gone to PT for her back.   PERTINENT HISTORY:  breast CA w/ radiation, chemo, L breast lumpectomy 2002, HLD, fibromyalgia, DM, R knee arthroscopy, C4-6 ACDF 2021, L shoulder surgery, post-polio syndrome, diabetes.    PAIN:  Are you having pain? Yes: NPRS scale: 4/10 Pain location: low back, across middle but more on R side  Pain description: achy, intermittent numbness and tingling down both legs to feet (none currently( Aggravating factors: everything - walking too much, standing too much, lifting, sometimes just too much Relieving factors: pain pill  PRECAUTIONS: Fall and Other: h/o cancer  RED FLAGS: None   WEIGHT BEARING RESTRICTIONS: No  FALLS:  Has patient fallen in last 6 months? Yes. Number of falls 5  LIVING ENVIRONMENT: Lives with: lives with their spouse Lives in: House/apartment Stairs: Yes: External: 1 steps; none Has following equipment at home: Single point cane and Grab bars  OCCUPATION: retired  PLOF:  Leisure: reading, occasionally uses floor bike  PATIENT GOALS: stop falling  NEXT MD VISIT: sees every three months  OBJECTIVE:   DIAGNOSTIC FINDINGS:  11/08/2022 MRI Lumbar spine IMPRESSION: L4-5: Facet osteoarthritis with 3 mm of anterolisthesis. Bulging of the disc. Stenosis of the subarticular lateral recesses left worse than right. Neural compression could occur at this level, particularly in the left lateral recess. The facet arthropathy could be a cause of back pain or referred facet syndrome pain. This is new since 2008.  PATIENT SURVEYS:  Modified Oswestry 24/50   COGNITION: Overall cognitive status: Within functional limits for tasks assessed     SENSATION: Light touch: Impaired  slightly diminished L 3 dermatome, otherwise WNL  MUSCLE LENGTH: NT  POSTURE:  leg length discrepancy, LLE shorter, L foot several sizes smaller  PALPATION:   LUMBAR ROM:   AROM eval  Flexion Above knee, pain R Side  Extension 50% limited, pain R side  Right lateral flexion To knee, pain R side  Left lateral flexion To knee, pain R side  Right rotation WNL, pain R side  Left rotation WNL, pain R side   (Blank rows = not tested)  LOWER EXTREMITY ROM:     fusion in L ankle limits DF, 10 deg DF on R, 5 deg on L.   LOWER EXTREMITY MMT:    MMT Right eval Left eval  Hip flexion 4 4-  Hip extension    Hip abduction 5 5  Hip adduction 4+ 4+  Knee flexion 4+ 3  Knee extension 5 4  Ankle dorsiflexion 3 3  Ankle plantarflexion 4* nwb 3* nwb   (Blank rows = not tested)  LUMBAR SPECIAL TESTS:  NT  FUNCTIONAL TESTS:  5 times sit to stand: 25 seconds Berg Balance Scale: 44 Dynamic Gait Index: NT MCTSIB: Condition 1: Avg of 3 trials: 30 sec, Condition 2: Avg of 3 trials: 30 sec, Condition 3: Avg of 3 trials: 30 sec, Condition 4: Avg of 3 trials: 30 sec, and Total Score: 120/120    GAIT: Distance walked:  50' Assistive device utilized: Single point cane Level of assistance:  Modified independence Comments: visually slow gait speed, decreased heel strike, limited L ankle DF, L foot smaller than R foot.    [facility] PT Assessment - 09/22/22 0001       Balance   Balance Assessed Yes      Standardized Balance Assessment   Standardized Balance Assessment Berg Balance Test;Dynamic Gait Index      Berg Balance Test   Sit to Stand Able to stand without using hands and stabilize independently    Standing Unsupported Able to stand safely 2 minutes    Sitting with Back Unsupported but Feet Supported on Floor or Stool Able to sit safely and securely 2 minutes    Stand to Sit Sits safely with minimal use of hands    Transfers Able to transfer safely, minor use of hands    Standing Unsupported with Eyes Closed Able to stand 10 seconds safely    Standing Unsupported with Feet Together Able to place feet together independently and stand 1 minute safely    From Standing, Reach Forward with Outstretched Arm Can reach forward >12 cm safely (5")    From Standing Position, Pick up Object from Floor Able to pick up shoe safely and easily    From Standing Position, Turn to Look Behind Over each Shoulder Looks behind from both sides and weight shifts well    Turn 360 Degrees Able to turn 360 degrees safely but slowly    Standing Unsupported, Alternately Place Feet on Step/Stool Needs assistance to keep from falling or unable to try    Standing Unsupported, One Foot in Front Needs help to step but can hold 15 seconds    Standing on One Leg Able to lift leg independently and hold equal to or more than 3 seconds    Total Score 44    Berg comment: signifcant risk of falls (>80%)             TODAY'S TREATMENT:                                                                                                                              DATE:   09/22/22 Self Care: Findings, POC, safety (already using cane), checked cane height, cues for using cane.  Also provided information on stores  that have split shoe programs.    PATIENT EDUCATION:  Education details: findings, POC Person educated: Patient Education method: Explanation Education comprehension: verbalized understanding  HOME EXERCISE PROGRAM: TBD  ASSESSMENT:  CLINICAL IMPRESSION: Linda [name]  is a 72 y.o. female who was seen today for physical therapy evaluation and treatment for lumbar DDD but primarily concerned gait and balance after frequent falls, she had polio as child resulting in LLD and L ankle fusion.    Patient presents with right sided  low back pain  symptoms which are interfering with ADLs and are impacting quality of life.  On  Modified Oswestry patient scored 24/50= 48% demonstrating severe disability.  [facility]  also presents with impaired activity tolerance, impaired standing balance, impaired ambulation, and decreased safety awareness impacting safe and independent functional mobility. Examination also reveals patient is at risk for falls and functional decline as evidenced by the following objective test measures: Gait speed < 1 m/sec, (31m/sec is needed for community access), Sharlene Motts 44/56 ((21-45 moderate risk for falls, indicates need for cane inside) 5x sit to stand of 25sec (>15sec indicates increased risk for falls and decreased BLE power). Patient will benefit from skilled physical therapy services to help reach the maximal level of functional independence and mobility. Patient demonstrates understanding of this plan of care and is in agreement with this plan.    OBJECTIVE IMPAIRMENTS: Abnormal gait, decreased activity tolerance, decreased balance, decreased endurance, decreased mobility, difficulty walking, decreased ROM, decreased strength, increased muscle spasms, impaired flexibility, and pain.   ACTIVITY LIMITATIONS: carrying, lifting, bending, standing, and locomotion level  PARTICIPATION LIMITATIONS: meal prep, cleaning, laundry, shopping, and community activity  PERSONAL  FACTORS: Age, Time since onset of injury/illness/exacerbation, and 3+ comorbidities: breast CA w/ radiation, chemo, L breast lumpectomy 2002, HLD, fibromyalgia, DM, R knee arthroscopy, C4-6 ACDF 2021, L shoulder surgery, post-polio syndrome  are also affecting patient's functional outcome.   REHAB POTENTIAL: Good  CLINICAL DECISION MAKING: Evolving/moderate complexity  EVALUATION COMPLEXITY: Moderate   GOALS: Goals reviewed with patient? Yes  SHORT TERM GOALS: Target date: 10/06/2022   Patient will be independent with initial HEP.  Baseline: needs Goal status: INITIAL   LONG TERM GOALS: Target date: 11/17/2022   Patient will be independent with advanced/ongoing HEP to improve outcomes and carryover.  Baseline:  Goal status: INITIAL  2.  Patient will report 50% improvement in low back pain to improve QOL.  Baseline:  Goal status: INITIAL  3.  Patient will demonstrate improved functional strength as demonstrated by 5x STS < 20 sec (MCID 5 sec). Baseline: 25 seconds  Goal status: INITIAL  4.  Patient will report at least 6 points improvement on modified Oswestry to demonstrate improved functional ability.  Baseline: 24/50 Goal status: INITIAL   5.  Patient will be able to step up/down curb safely with LRAD for safety with community ambulation.  Baseline: catching toe and falling frequently Goal status: INITIAL   6.  Patient will demonstrate at least 19/24 on DGI to improve gait stability and reduce risk for falls. Baseline: NT Goal status: INITIAL  7.  Patient will score >49/56 on Berg Balance test to demonstrate lower risk of falls.   Baseline: 44/56 Goal status: INITIAL  8.  Patient will demonstrate gait speed of >0.8 m/s to be a safe community ambulator with decreased risk for recurrent falls.  Baseline: Not measured Goal status: INITIAL    PLAN:  PT FREQUENCY: 1-2x/week  PT DURATION: 8 weeks  PLANNED INTERVENTIONS: Therapeutic exercises, Therapeutic  activity, Neuromuscular re-education, Balance training, Gait training, Patient/Family education, Self Care, Joint mobilization, Stair training, Dry Needling, Spinal mobilization, Cryotherapy, Moist heat, Manual therapy, and Re-evaluation.  PLAN FOR NEXT SESSION: measured gait speed, DGI.  Start exercises for hip strengthening and core strengthening.  Needs both back, core and LE for back pain and balance.    Jena Gauss, PT, DPT  09/22/2022, 10:55 AM

## 2022-09-22 ENCOUNTER — Ambulatory Visit: Payer: Medicare PPO | Attending: Neurosurgery | Admitting: Physical Therapy

## 2022-09-22 DIAGNOSIS — M5459 Other low back pain: Secondary | ICD-10-CM | POA: Insufficient documentation

## 2022-09-22 DIAGNOSIS — R2689 Other abnormalities of gait and mobility: Secondary | ICD-10-CM | POA: Insufficient documentation

## 2022-09-22 DIAGNOSIS — M6281 Muscle weakness (generalized): Secondary | ICD-10-CM | POA: Diagnosis not present

## 2022-09-24 ENCOUNTER — Encounter: Payer: Self-pay | Admitting: Physical Therapy

## 2022-09-24 ENCOUNTER — Ambulatory Visit: Payer: Medicare PPO | Admitting: Physical Therapy

## 2022-09-24 DIAGNOSIS — R2689 Other abnormalities of gait and mobility: Secondary | ICD-10-CM | POA: Diagnosis not present

## 2022-09-24 DIAGNOSIS — M6281 Muscle weakness (generalized): Secondary | ICD-10-CM

## 2022-09-24 DIAGNOSIS — M5459 Other low back pain: Secondary | ICD-10-CM

## 2022-09-24 NOTE — Therapy (Signed)
OUTPATIENT PHYSICAL THERAPY TREATMENT   Patient Name: Linda [name] MRN: [id number] DOB:18-Sep-1950, 72 y.o., female Today's Date: 09/24/2022  END OF SESSION:  PT End of Session - 09/24/22 1455     Visit Number 2    Date for PT Re-Evaluation 11/17/22    Authorization Type [employer] MCR    Authorization Time Period pending authorization    Progress Note Due on Visit 10    PT Start Time 1453    PT Stop Time 1531    PT Time Calculation (min) 38 min    Activity Tolerance Patient tolerated treatment well    Behavior During Therapy [facility] for tasks assessed/performed             Past Medical History:  Diagnosis Date   Allergic rhinitis    Arthritis    Breast cancer (HCC) 2002   left   Diabetes (HCC)    Fibromyalgia    Glaucoma    Goals of care, counseling/discussion 12/18/2019   Hypercholesteremia    Hyperlipidemia    Personal history of chemotherapy 05/2000   left breast   Personal history of radiation therapy 2002   Letf breast   Stage II infiltrating ductal breast carcinoma, ER-, left (HCC) 12/18/2019   Past Surgical History:  Procedure Laterality Date   APPENDECTOMY  1989   BREAST BIOPSY Left 04/26/2000   BREAST LUMPECTOMY Left 05/14/2000   left   CHOLECYSTECTOMY  1989   KNEE ARTHROSCOPY Right    NECK SURGERY  06/01/2019   SHOULDER SURGERY Left    VAGINAL HYSTERECTOMY  1987   Patient Active Problem List   Diagnosis Date Noted   Stage II infiltrating ductal breast carcinoma, ER-, left (HCC) 12/18/2019   Goals of care, counseling/discussion 12/18/2019   IDA (iron deficiency anemia) 12/19/2018   Fatigue 12/10/2015   Primary osteoarthritis of both hands 12/10/2015   Low back pain without sciatica 12/10/2015   Type 2 diabetes mellitus  12/10/2015   Depression 12/10/2015   Chronic pansinusitis 09/26/2015   Genetic testing 01/11/2014   Hives 11/11/2012   Chest pain 04/23/2012   Hyperlipidemia    Chronic insomnia 01/29/2011   Perennial allergic rhinitis  01/29/2011   Fibromyalgia 01/29/2011    PCP: Renaye Rakers, MD   REFERRING PROVIDER: Raylene Miyamoto, PA-C   REFERRING DIAG: M51.36 (ICD-10-CM) - DDD (degenerative disc disease), lumbar R26.9 (ICD-10-CM) - Gait abnormality  Rationale for Evaluation and Treatment: Rehabilitation  THERAPY DIAG:  Other abnormalities of gait and mobility  Muscle weakness (generalized)  Other low back pain  ONSET DATE: ongoing chronic, got worse over last few months.   SUBJECTIVE:  SUBJECTIVE STATEMENT: Back is doing pretty good.  Knees are acting up, wearing copper compression brace on L knee.    PERTINENT HISTORY:  breast CA w/ radiation, chemo, L breast lumpectomy 2002, HLD, fibromyalgia, DM, R knee arthroscopy, C4-6 ACDF 2021, L shoulder surgery, post-polio syndrome, diabetes.    PAIN:  Are you having pain? Yes: NPRS scale: 2/10 Pain location: low back, across middle but more on R side  Pain description: achy, intermittent numbness and tingling down both legs to feet (none currently( Aggravating factors: everything - walking too much, standing too much, lifting, sometimes just too much Relieving factors: pain pill  PRECAUTIONS: Fall and Other: h/o cancer  RED FLAGS: None   WEIGHT BEARING RESTRICTIONS: No  FALLS:  Has patient fallen in last 6 months? Yes. Number of falls 5  LIVING ENVIRONMENT: Lives with: lives with their spouse Lives in: House/apartment Stairs: Yes: External: 1 steps; none Has following equipment at home: Single point cane and Grab bars  OCCUPATION: retired  PLOF: Leisure: reading, occasionally uses floor bike  PATIENT GOALS: stop falling  NEXT MD VISIT: sees every three months  OBJECTIVE:   DIAGNOSTIC FINDINGS:  11/08/2022 MRI Lumbar spine IMPRESSION: L4-5:  Facet osteoarthritis with 3 mm of anterolisthesis. Bulging of the disc. Stenosis of the subarticular lateral recesses left worse than right. Neural compression could occur at this level, particularly in the left lateral recess. The facet arthropathy could be a cause of back pain or referred facet syndrome pain. This is new since 2008.  PATIENT SURVEYS:  Modified Oswestry 24/50   COGNITION: Overall cognitive status: Within functional limits for tasks assessed     SENSATION: Light touch: Impaired  slightly diminished L 3 dermatome, otherwise WNL  MUSCLE LENGTH: NT  POSTURE:  leg length discrepancy, LLE shorter, L foot several sizes smaller  PALPATION:   LUMBAR ROM:   AROM eval  Flexion Above knee, pain R Side  Extension 50% limited, pain R side  Right lateral flexion To knee, pain R side  Left lateral flexion To knee, pain R side  Right rotation WNL, pain R side  Left rotation WNL, pain R side   (Blank rows = not tested)  LOWER EXTREMITY ROM:     fusion in L ankle limits DF, 10 deg DF on R, 5 deg on L.   LOWER EXTREMITY MMT:    MMT Right eval Left eval  Hip flexion 4 4-  Hip extension    Hip abduction 5 5  Hip adduction 4+ 4+  Knee flexion 4+ 3  Knee extension 5 4  Ankle dorsiflexion 3 3  Ankle plantarflexion 4* nwb 3* nwb   (Blank rows = not tested)  LUMBAR SPECIAL TESTS:  NT  FUNCTIONAL TESTS:  5 times sit to stand: 25 seconds Berg Balance Scale: 44/56 Dynamic Gait Index: 11/24 MCTSIB: Condition 1: Avg of 3 trials: 30 sec, Condition 2: Avg of 3 trials: 30 sec, Condition 3: Avg of 3 trials: 30 sec, Condition 4: Avg of 3 trials: 30 sec, and Total Score: 120/120    GAIT: Distance walked: 49' Assistive device utilized: Single point cane Level of assistance: Modified independence Comments: visually slow gait speed, decreased heel strike, limited L ankle DF, L foot smaller than R foot.    [facility] PT Assessment - 09/24/22 0001       Dynamic Gait Index   Level  Surface Moderate Impairment    Change in Gait Speed Moderate Impairment    Gait with Horizontal Head Turns  Mild Impairment    Gait with Vertical Head Turns Mild Impairment    Gait and Pivot Turn Moderate Impairment    Step Over Obstacle Moderate Impairment    Step Around Obstacles Mild Impairment    Steps Moderate Impairment    Total Score 11              TODAY'S TREATMENT:                                                                                                                              DATE:   09/24/22 Therapeutic Activity:  assessing gait DGI - 11/24 Gait speed 6 m/13.3 seconds = 0.76m/s Therapeutic Exercise: to improve strength and mobility.  Demo, verbal and tactile cues throughout for technique. Nustep L4 x 5 min  Supine PPT x 10  Supine PPT with march x 10  Supine PPT with pillow squeeze x 10  Supine PPT with bent knee fall outs x 10 each Seated Toe raises  x 10 bil Seated heel raises x 10 bil Seated LAQ x 10 each Seated march/hip abduction x 10 each    09/22/22 Self Care: Findings, POC, safety (already using cane), checked cane height, cues for using cane.  Also provided information on stores that have split shoe programs.    PATIENT EDUCATION:  Education details: initial HEP Person educated: Patient Education method: Programmer, multimedia, Facilities manager, Verbal cues, and Handouts Education comprehension: verbalized understanding and returned demonstration  HOME EXERCISE PROGRAM: Access Code: [email or web address] URL: [email or web address] Date: 09/24/2022 Prepared by: Harrie Foreman  Exercises - Supine Posterior Pelvic Tilt  - 1 x daily - 7 x weekly - 2 sets - 10 reps - Supine Lower Trunk Rotation  - 1 x daily - 7 x weekly - 2 sets - 10 reps - Posterior Pelvic Tilt with Adduction Using Pillow in Hooklying  - 1 x daily - 7 x weekly - 2 sets - 10 reps - Bent Knee Fallouts with Alternating Legs  - 1 x daily - 7 x weekly - 2 sets - 10 reps - Seated Heel  Toe Raises  - 1 x daily - 7 x weekly - 2 sets - 10 reps - Seated March  - 1 x daily - 7 x weekly - 2 sets - 10 reps - Seated Long Arc Quad  - 1 x daily - 7 x weekly - 2 sets - 10 reps  ASSESSMENT:  CLINICAL IMPRESSION: Linda [name] completed DGI today, scoring 11/24 demonstrating high risk for falls.  She reported increased LBP after completing DGI, especially stepping over box.  Started therex for core and hip strengthening focusing on seated and supine exercises, reported that PPT decreased LBP.  Rutherford Limerick continues to demonstrate potential for improvement and would benefit from continued skilled therapy to address impairments.      OBJECTIVE IMPAIRMENTS: Abnormal gait, decreased activity tolerance, decreased balance, decreased endurance, decreased mobility, difficulty walking, decreased ROM, decreased strength, increased muscle  spasms, impaired flexibility, and pain.   ACTIVITY LIMITATIONS: carrying, lifting, bending, standing, and locomotion level  PARTICIPATION LIMITATIONS: meal prep, cleaning, laundry, shopping, and community activity  PERSONAL FACTORS: Age, Time since onset of injury/illness/exacerbation, and 3+ comorbidities: breast CA w/ radiation, chemo, L breast lumpectomy 2002, HLD, fibromyalgia, DM, R knee arthroscopy, C4-6 ACDF 2021, L shoulder surgery, post-polio syndrome  are also affecting patient's functional outcome.   REHAB POTENTIAL: Good  CLINICAL DECISION MAKING: Evolving/moderate complexity  EVALUATION COMPLEXITY: Moderate   GOALS: Goals reviewed with patient? Yes  SHORT TERM GOALS: Target date: 10/06/2022   Patient will be independent with initial HEP.  Baseline: needs Goal status: IN PROGRESS 09/24/22 - given   LONG TERM GOALS: Target date: 11/17/2022   Patient will be independent with advanced/ongoing HEP to improve outcomes and carryover.  Baseline:  Goal status: IN PROGRESS  2.  Patient will report 50% improvement in low back pain to  improve QOL.  Baseline:  Goal status: IN PROGRESS  3.  Patient will demonstrate improved functional strength as demonstrated by 5x STS < 20 sec (MCID 5 sec). Baseline: 25 seconds  Goal status: IN PROGRESS  4.  Patient will report at least 6 points improvement on modified Oswestry to demonstrate improved functional ability.  Baseline: 24/50 Goal status: IN PROGRESS   5.  Patient will be able to step up/down curb safely with LRAD for safety with community ambulation.  Baseline: catching toe and falling frequently Goal status: IN PROGRESS   6.  Patient will demonstrate at least 19/24 on DGI to improve gait stability and reduce risk for falls. Baseline: 11/24 Goal status: IN PROGRESS  7.  Patient will score >49/56 on Berg Balance test to demonstrate lower risk of falls.   Baseline: 44/56 Goal status: IN PROGRESS  8.  Patient will demonstrate gait speed of >0.8 m/s to be a safe community ambulator with decreased risk for recurrent falls.  Baseline: 0.46 m/s Goal status: IN PROGRESS    PLAN:  PT FREQUENCY: 1-2x/week  PT DURATION: 8 weeks  PLANNED INTERVENTIONS: Therapeutic exercises, Therapeutic activity, Neuromuscular re-education, Balance training, Gait training, Patient/Family education, Self Care, Joint mobilization, Stair training, Dry Needling, Spinal mobilization, Cryotherapy, Moist heat, Manual therapy, and Re-evaluation.  PLAN FOR NEXT SESSION: continue exercises for hip strengthening and core strengthening as tolerated.  Needs both back, core and LE for back pain and balance.    Jena Gauss, PT, DPT  09/24/2022, 5:39 PM

## 2022-09-29 ENCOUNTER — Ambulatory Visit: Payer: Medicare PPO | Attending: Neurosurgery

## 2022-09-29 DIAGNOSIS — M5459 Other low back pain: Secondary | ICD-10-CM | POA: Diagnosis not present

## 2022-09-29 DIAGNOSIS — R2689 Other abnormalities of gait and mobility: Secondary | ICD-10-CM | POA: Insufficient documentation

## 2022-09-29 DIAGNOSIS — M6281 Muscle weakness (generalized): Secondary | ICD-10-CM | POA: Diagnosis not present

## 2022-09-29 NOTE — Therapy (Signed)
OUTPATIENT PHYSICAL THERAPY TREATMENT   Patient Name: Linda [name] MRN: [id number] DOB:1950/07/22, 72 y.o., female Today's Date: 09/29/2022  END OF SESSION:  PT End of Session - 09/29/22 1322     Visit Number 3    Date for PT Re-Evaluation 11/17/22    Authorization Type [employer] MCR    Authorization Time Period pending authorization    Progress Note Due on Visit 10    PT Start Time 1316    PT Stop Time 1357    PT Time Calculation (min) 41 min    Activity Tolerance Patient tolerated treatment well    Behavior During Therapy [facility] for tasks assessed/performed              Past Medical History:  Diagnosis Date   Allergic rhinitis    Arthritis    Breast cancer (HCC) 2002   left   Diabetes (HCC)    Fibromyalgia    Glaucoma    Goals of care, counseling/discussion 12/18/2019   Hypercholesteremia    Hyperlipidemia    Personal history of chemotherapy 05/2000   left breast   Personal history of radiation therapy 2002   Letf breast   Stage II infiltrating ductal breast carcinoma, ER-, left (HCC) 12/18/2019   Past Surgical History:  Procedure Laterality Date   APPENDECTOMY  1989   BREAST BIOPSY Left 04/26/2000   BREAST LUMPECTOMY Left 05/14/2000   left   CHOLECYSTECTOMY  1989   KNEE ARTHROSCOPY Right    NECK SURGERY  06/01/2019   SHOULDER SURGERY Left    VAGINAL HYSTERECTOMY  1987   Patient Active Problem List   Diagnosis Date Noted   Stage II infiltrating ductal breast carcinoma, ER-, left (HCC) 12/18/2019   Goals of care, counseling/discussion 12/18/2019   IDA (iron deficiency anemia) 12/19/2018   Fatigue 12/10/2015   Primary osteoarthritis of both hands 12/10/2015   Low back pain without sciatica 12/10/2015   Type 2 diabetes mellitus  12/10/2015   Depression 12/10/2015   Chronic pansinusitis 09/26/2015   Genetic testing 01/11/2014   Hives 11/11/2012   Chest pain 04/23/2012   Hyperlipidemia    Chronic insomnia 01/29/2011   Perennial allergic rhinitis  01/29/2011   Fibromyalgia 01/29/2011    PCP: Renaye Rakers, MD   REFERRING PROVIDER: Raylene Miyamoto, PA-C   REFERRING DIAG: M51.36 (ICD-10-CM) - DDD (degenerative disc disease), lumbar R26.9 (ICD-10-CM) - Gait abnormality  Rationale for Evaluation and Treatment: Rehabilitation  THERAPY DIAG:  Other abnormalities of gait and mobility  Muscle weakness (generalized)  Other low back pain  ONSET DATE: ongoing chronic, got worse over last few months.   SUBJECTIVE:  SUBJECTIVE STATEMENT: Pt reports  knees are acting up again today. Did not do too many exercises over the weekend.  PERTINENT HISTORY:  breast CA w/ radiation, chemo, L breast lumpectomy 2002, HLD, fibromyalgia, DM, R knee arthroscopy, C4-6 ACDF 2021, L shoulder surgery, post-polio syndrome, diabetes.    PAIN:  Are you having pain? Yes: NPRS scale: 3: 4/10 pain for bil knees /10 Pain location: low back, across middle but more on R side  Pain description: achy, intermittent numbness and tingling down both legs to feet (none currently( Aggravating factors: everything - walking too much, standing too much, lifting, sometimes just too much Relieving factors: pain pill  PRECAUTIONS: Fall and Other: h/o cancer  RED FLAGS: None   WEIGHT BEARING RESTRICTIONS: No  FALLS:  Has patient fallen in last 6 months? Yes. Number of falls 5  LIVING ENVIRONMENT: Lives with: lives with their spouse Lives in: House/apartment Stairs: Yes: External: 1 steps; none Has following equipment at home: Single point cane and Grab bars  OCCUPATION: retired  PLOF: Leisure: reading, occasionally uses floor bike  PATIENT GOALS: stop falling  NEXT MD VISIT: sees every three months  OBJECTIVE:   DIAGNOSTIC FINDINGS:  11/08/2022 MRI Lumbar  spine IMPRESSION: L4-5: Facet osteoarthritis with 3 mm of anterolisthesis. Bulging of the disc. Stenosis of the subarticular lateral recesses left worse than right. Neural compression could occur at this level, particularly in the left lateral recess. The facet arthropathy could be a cause of back pain or referred facet syndrome pain. This is new since 2008.  PATIENT SURVEYS:  Modified Oswestry 24/50   COGNITION: Overall cognitive status: Within functional limits for tasks assessed     SENSATION: Light touch: Impaired  slightly diminished L 3 dermatome, otherwise WNL  MUSCLE LENGTH: NT  POSTURE:  leg length discrepancy, LLE shorter, L foot several sizes smaller  PALPATION:   LUMBAR ROM:   AROM eval  Flexion Above knee, pain R Side  Extension 50% limited, pain R side  Right lateral flexion To knee, pain R side  Left lateral flexion To knee, pain R side  Right rotation WNL, pain R side  Left rotation WNL, pain R side   (Blank rows = not tested)  LOWER EXTREMITY ROM:     fusion in L ankle limits DF, 10 deg DF on R, 5 deg on L.   LOWER EXTREMITY MMT:    MMT Right eval Left eval  Hip flexion 4 4-  Hip extension    Hip abduction 5 5  Hip adduction 4+ 4+  Knee flexion 4+ 3  Knee extension 5 4  Ankle dorsiflexion 3 3  Ankle plantarflexion 4* nwb 3* nwb   (Blank rows = not tested)  LUMBAR SPECIAL TESTS:  NT  FUNCTIONAL TESTS:  5 times sit to stand: 25 seconds Berg Balance Scale: 44/56 Dynamic Gait Index: 11/24 MCTSIB: Condition 1: Avg of 3 trials: 30 sec, Condition 2: Avg of 3 trials: 30 sec, Condition 3: Avg of 3 trials: 30 sec, Condition 4: Avg of 3 trials: 30 sec, and Total Score: 120/120    GAIT: Distance walked: 67' Assistive device utilized: Single point cane Level of assistance: Modified independence Comments: visually slow gait speed, decreased heel strike, limited L ankle DF, L foot smaller than R foot.       TODAY'S TREATMENT:  DATE:  09/29/22 Therapeutic Exercise: to improve strength and mobility.  Demo, verbal and tactile cues throughout for technique. Nustep L4 x 6 min  Seated ab sets orange pball 2x10 3" pause Seated marching bil TrA set x 12 Seated LAQ with TrA  x 10  Heel raises x 10  [employer] pews x 10 - mirror used for visual feedback, more weight on R side Retro step x 10 B- more difficult with LLE behind Clock balance 1/2 way around R/L 2x using cane with RLE Supine PPT x 10  Supine LTR x 10 each way Figure 4 piriformis stretch x 30 sec bil Bridges with TrA x 10   09/24/22 Therapeutic Activity:  assessing gait DGI - 11/24 Gait speed 6 m/13.3 seconds = 0.62m/s Therapeutic Exercise: to improve strength and mobility.  Demo, verbal and tactile cues throughout for technique. Nustep L4 x 5 min  Supine PPT x 10  Supine PPT with march x 10  Supine PPT with pillow squeeze x 10  Supine PPT with bent knee fall outs x 10 each Seated Toe raises  x 10 bil Seated heel raises x 10 bil Seated LAQ x 10 each Seated march/hip abduction x 10 each    09/22/22 Self Care: Findings, POC, safety (already using cane), checked cane height, cues for using cane.  Also provided information on stores that have split shoe programs.    PATIENT EDUCATION:  Education details: initial HEP Person educated: Patient Education method: Programmer, multimedia, Facilities manager, Verbal cues, and Handouts Education comprehension: verbalized understanding and returned demonstration  HOME EXERCISE PROGRAM: Access Code: [email or web address] URL: [email or web address] Date: 09/24/2022 Prepared by: Harrie Foreman  Exercises - Supine Posterior Pelvic Tilt  - 1 x daily - 7 x weekly - 2 sets - 10 reps - Supine Lower Trunk Rotation  - 1 x daily - 7 x weekly - 2 sets - 10 reps - Posterior Pelvic Tilt with Adduction Using Pillow in Hooklying  - 1  x daily - 7 x weekly - 2 sets - 10 reps - Bent Knee Fallouts with Alternating Legs  - 1 x daily - 7 x weekly - 2 sets - 10 reps - Seated Heel Toe Raises  - 1 x daily - 7 x weekly - 2 sets - 10 reps - Seated March  - 1 x daily - 7 x weekly - 2 sets - 10 reps - Seated Long Arc Quad  - 1 x daily - 7 x weekly - 2 sets - 10 reps  ASSESSMENT:  CLINICAL IMPRESSION:  Continued with progression of exercises to improve strength and balance with daily activities. Pt demonstrates difficulty maintain stability when having to support herself on LLE only. Keeps weight mostly on RLE, so utilized mirror to facilitate equal weight bearing. Rutherford Limerick continues to demonstrate potential for improvement and would benefit from continued skilled therapy to address impairments.      OBJECTIVE IMPAIRMENTS: Abnormal gait, decreased activity tolerance, decreased balance, decreased endurance, decreased mobility, difficulty walking, decreased ROM, decreased strength, increased muscle spasms, impaired flexibility, and pain.   ACTIVITY LIMITATIONS: carrying, lifting, bending, standing, and locomotion level  PARTICIPATION LIMITATIONS: meal prep, cleaning, laundry, shopping, and community activity  PERSONAL FACTORS: Age, Time since onset of injury/illness/exacerbation, and 3+ comorbidities: breast CA w/ radiation, chemo, L breast lumpectomy 2002, HLD, fibromyalgia, DM, R knee arthroscopy, C4-6 ACDF 2021, L shoulder surgery, post-polio syndrome  are also affecting patient's functional outcome.   REHAB POTENTIAL: Good  CLINICAL DECISION MAKING: Evolving/moderate complexity  EVALUATION  COMPLEXITY: Moderate   GOALS: Goals reviewed with patient? Yes  SHORT TERM GOALS: Target date: 10/06/2022   Patient will be independent with initial HEP.  Baseline: needs Goal status: IN PROGRESS 09/24/22 - given   LONG TERM GOALS: Target date: 11/17/2022   Patient will be independent with advanced/ongoing HEP to improve  outcomes and carryover.  Baseline:  Goal status: IN PROGRESS  2.  Patient will report 50% improvement in low back pain to improve QOL.  Baseline:  Goal status: IN PROGRESS  3.  Patient will demonstrate improved functional strength as demonstrated by 5x STS < 20 sec (MCID 5 sec). Baseline: 25 seconds  Goal status: IN PROGRESS  4.  Patient will report at least 6 points improvement on modified Oswestry to demonstrate improved functional ability.  Baseline: 24/50 Goal status: IN PROGRESS   5.  Patient will be able to step up/down curb safely with LRAD for safety with community ambulation.  Baseline: catching toe and falling frequently Goal status: IN PROGRESS   6.  Patient will demonstrate at least 19/24 on DGI to improve gait stability and reduce risk for falls. Baseline: 11/24 Goal status: IN PROGRESS  7.  Patient will score >49/56 on Berg Balance test to demonstrate lower risk of falls.   Baseline: 44/56 Goal status: IN PROGRESS  8.  Patient will demonstrate gait speed of >0.8 m/s to be a safe community ambulator with decreased risk for recurrent falls.  Baseline: 0.46 m/s Goal status: IN PROGRESS    PLAN:  PT FREQUENCY: 1-2x/week  PT DURATION: 8 weeks  PLANNED INTERVENTIONS: Therapeutic exercises, Therapeutic activity, Neuromuscular re-education, Balance training, Gait training, Patient/Family education, Self Care, Joint mobilization, Stair training, Dry Needling, Spinal mobilization, Cryotherapy, Moist heat, Manual therapy, and Re-evaluation.  PLAN FOR NEXT SESSION: continue exercises for hip strengthening and core strengthening as tolerated.  Needs both back, core and LE for back pain and balance.    Darleene Cleaver, PTA 09/29/2022, 2:08 PM

## 2022-10-01 ENCOUNTER — Ambulatory Visit: Payer: Medicare PPO

## 2022-10-01 DIAGNOSIS — M6281 Muscle weakness (generalized): Secondary | ICD-10-CM | POA: Diagnosis not present

## 2022-10-01 DIAGNOSIS — M5459 Other low back pain: Secondary | ICD-10-CM

## 2022-10-01 DIAGNOSIS — R2689 Other abnormalities of gait and mobility: Secondary | ICD-10-CM

## 2022-10-01 NOTE — Therapy (Signed)
OUTPATIENT PHYSICAL THERAPY TREATMENT   Patient Name: Nakeira Scullin MRN: [id number] DOB:07-Aug-1950, 72 y.o., female Today's Date: 10/01/2022  END OF SESSION:  PT End of Session - 10/01/22 1419     Visit Number 4    Date for PT Re-Evaluation 11/17/22    Authorization Type [employer] MCR    Authorization Time Period pending authorization    Progress Note Due on Visit 10    PT Start Time 1415   pt late   PT Stop Time 1445    PT Time Calculation (min) 30 min    Activity Tolerance Patient tolerated treatment well    Behavior During Therapy [facility] for tasks assessed/performed              Past Medical History:  Diagnosis Date   Allergic rhinitis    Arthritis    Breast cancer (HCC) 2002   left   Diabetes (HCC)    Fibromyalgia    Glaucoma    Goals of care, counseling/discussion 12/18/2019   Hypercholesteremia    Hyperlipidemia    Personal history of chemotherapy 05/2000   left breast   Personal history of radiation therapy 2002   Letf breast   Stage II infiltrating ductal breast carcinoma, ER-, left (HCC) 12/18/2019   Past Surgical History:  Procedure Laterality Date   APPENDECTOMY  1989   BREAST BIOPSY Left 04/26/2000   BREAST LUMPECTOMY Left 05/14/2000   left   CHOLECYSTECTOMY  1989   KNEE ARTHROSCOPY Right    NECK SURGERY  06/01/2019   SHOULDER SURGERY Left    VAGINAL HYSTERECTOMY  1987   Patient Active Problem List   Diagnosis Date Noted   Stage II infiltrating ductal breast carcinoma, ER-, left (HCC) 12/18/2019   Goals of care, counseling/discussion 12/18/2019   IDA (iron deficiency anemia) 12/19/2018   Fatigue 12/10/2015   Primary osteoarthritis of both hands 12/10/2015   Low back pain without sciatica 12/10/2015   Type 2 diabetes mellitus  12/10/2015   Depression 12/10/2015   Chronic pansinusitis 09/26/2015   Genetic testing 01/11/2014   Hives 11/11/2012   Chest pain 04/23/2012   Hyperlipidemia    Chronic insomnia 01/29/2011   Perennial allergic  rhinitis 01/29/2011   Fibromyalgia 01/29/2011    PCP: Renaye Rakers, MD   REFERRING PROVIDER: Raylene Miyamoto, PA-C   REFERRING DIAG: M51.36 (ICD-10-CM) - DDD (degenerative disc disease), lumbar R26.9 (ICD-10-CM) - Gait abnormality  Rationale for Evaluation and Treatment: Rehabilitation  THERAPY DIAG:  Other abnormalities of gait and mobility  Muscle weakness (generalized)  Other low back pain  ONSET DATE: ongoing chronic, got worse over last few months.   SUBJECTIVE:  SUBJECTIVE STATEMENT: Pt reports fibromyalgia acting up today, she did her exercises after last session and yesterday she was barely able to move. She did not think she was going to make it today.  PERTINENT HISTORY:  breast CA w/ radiation, chemo, L breast lumpectomy 2002, HLD, fibromyalgia, DM, R knee arthroscopy, C4-6 ACDF 2021, L shoulder surgery, post-polio syndrome, diabetes.    PAIN:  Are you having pain? Yes: NPRS scale: 3: 4/10 pain for bil knees /10 Pain location: low back, across middle but more on R side  Pain description: achy, intermittent numbness and tingling down both legs to feet (none currently( Aggravating factors: everything - walking too much, standing too much, lifting, sometimes just too much Relieving factors: pain pill  PRECAUTIONS: Fall and Other: h/o cancer  RED FLAGS: None   WEIGHT BEARING RESTRICTIONS: No  FALLS:  Has patient fallen in last 6 months? Yes. Number of falls 5  LIVING ENVIRONMENT: Lives with: lives with their spouse Lives in: House/apartment Stairs: Yes: External: 1 steps; none Has following equipment at home: Single point cane and Grab bars  OCCUPATION: retired  PLOF: Leisure: reading, occasionally uses floor bike  PATIENT GOALS: stop falling  NEXT MD  VISIT: sees every three months  OBJECTIVE:   DIAGNOSTIC FINDINGS:  11/08/2022 MRI Lumbar spine IMPRESSION: L4-5: Facet osteoarthritis with 3 mm of anterolisthesis. Bulging of the disc. Stenosis of the subarticular lateral recesses left worse than right. Neural compression could occur at this level, particularly in the left lateral recess. The facet arthropathy could be a cause of back pain or referred facet syndrome pain. This is new since 2008.  PATIENT SURVEYS:  Modified Oswestry 24/50   COGNITION: Overall cognitive status: Within functional limits for tasks assessed     SENSATION: Light touch: Impaired  slightly diminished L 3 dermatome, otherwise WNL  MUSCLE LENGTH: NT  POSTURE:  leg length discrepancy, LLE shorter, L foot several sizes smaller  PALPATION:   LUMBAR ROM:   AROM eval  Flexion Above knee, pain R Side  Extension 50% limited, pain R side  Right lateral flexion To knee, pain R side  Left lateral flexion To knee, pain R side  Right rotation WNL, pain R side  Left rotation WNL, pain R side   (Blank rows = not tested)  LOWER EXTREMITY ROM:     fusion in L ankle limits DF, 10 deg DF on R, 5 deg on L.   LOWER EXTREMITY MMT:    MMT Right eval Left eval  Hip flexion 4 4-  Hip extension    Hip abduction 5 5  Hip adduction 4+ 4+  Knee flexion 4+ 3  Knee extension 5 4  Ankle dorsiflexion 3 3  Ankle plantarflexion 4* nwb 3* nwb   (Blank rows = not tested)  LUMBAR SPECIAL TESTS:  NT  FUNCTIONAL TESTS:  5 times sit to stand: 25 seconds Berg Balance Scale: 44/56 Dynamic Gait Index: 11/24 MCTSIB: Condition 1: Avg of 3 trials: 30 sec, Condition 2: Avg of 3 trials: 30 sec, Condition 3: Avg of 3 trials: 30 sec, Condition 4: Avg of 3 trials: 30 sec, and Total Score: 120/120    GAIT: Distance walked: 36' Assistive device utilized: Single point cane Level of assistance: Modified independence Comments: visually slow gait speed, decreased heel strike,  limited L ankle DF, L foot smaller than R foot.       TODAY'S TREATMENT:  DATE:  10/01/22 Therapeutic Exercise: to improve strength and mobility.  Demo, verbal and tactile cues throughout for technique. Nustep L3 x 5 min - level dropped d/t flare up Gait 180 ft with SPC Seated 3 way lumbar flexion AROM with green pball x 10  Seated heel slides x 10 bil Seated LAQ x 10 bil Supine LTR x 10   Manual Therapy: to decrease muscle spasm, pain and improve mobility.  STM to bil glutes and piriformis  09/29/22 Therapeutic Exercise: to improve strength and mobility.  Demo, verbal and tactile cues throughout for technique. Nustep L4 x 6 min  Seated ab sets orange pball 2x10 3" pause Seated marching bil TrA set x 12 Seated LAQ with TrA  x 10  Heel raises x 10  Church pews x 10 - mirror used for visual feedback, more weight on R side Retro step x 10 B- more difficult with LLE behind Clock balance 1/2 way around R/L 2x using cane with RLE Supine PPT x 10  Supine LTR x 10 each way Figure 4 piriformis stretch x 30 sec bil Bridges with TrA x 10   09/24/22 Therapeutic Activity:  assessing gait DGI - 11/24 Gait speed 6 m/13.3 seconds = 0.76m/s Therapeutic Exercise: to improve strength and mobility.  Demo, verbal and tactile cues throughout for technique. Nustep L4 x 5 min  Supine PPT x 10  Supine PPT with march x 10  Supine PPT with pillow squeeze x 10  Supine PPT with bent knee fall outs x 10 each Seated Toe raises  x 10 bil Seated heel raises x 10 bil Seated LAQ x 10 each Seated march/hip abduction x 10 each    09/22/22 Self Care: Findings, POC, safety (already using cane), checked cane height, cues for using cane.  Also provided information on stores that have split shoe programs.    PATIENT EDUCATION:  Education details: initial HEP Person educated:  Patient Education method: Programmer, multimedia, Facilities manager, Verbal cues, and Handouts Education comprehension: verbalized understanding and returned demonstration  HOME EXERCISE PROGRAM: Access Code: [email or web address] URL: [email or web address] Date: 09/24/2022 Prepared by: Harrie Foreman  Exercises - Supine Posterior Pelvic Tilt  - 1 x daily - 7 x weekly - 2 sets - 10 reps - Supine Lower Trunk Rotation  - 1 x daily - 7 x weekly - 2 sets - 10 reps - Posterior Pelvic Tilt with Adduction Using Pillow in Hooklying  - 1 x daily - 7 x weekly - 2 sets - 10 reps - Bent Knee Fallouts with Alternating Legs  - 1 x daily - 7 x weekly - 2 sets - 10 reps - Seated Heel Toe Raises  - 1 x daily - 7 x weekly - 2 sets - 10 reps - Seated March  - 1 x daily - 7 x weekly - 2 sets - 10 reps - Seated Long Arc Quad  - 1 x daily - 7 x weekly - 2 sets - 10 reps  ASSESSMENT:  CLINICAL IMPRESSION:  Shirl was more flared up today noting that she completed HEP after last visit. Lightly worked on mobility exercises to ease soreness and pain. Followed with STM to glutes to address muscle tension, which she was moderately tight in both sides. Education given on only doing HEP on non-PT days. Rutherford Limerick continues to demonstrate potential for improvement and would benefit from continued skilled therapy to address impairments.      OBJECTIVE IMPAIRMENTS: Abnormal gait, decreased activity tolerance, decreased balance, decreased endurance, decreased  mobility, difficulty walking, decreased ROM, decreased strength, increased muscle spasms, impaired flexibility, and pain.   ACTIVITY LIMITATIONS: carrying, lifting, bending, standing, and locomotion level  PARTICIPATION LIMITATIONS: meal prep, cleaning, laundry, shopping, and community activity  PERSONAL FACTORS: Age, Time since onset of injury/illness/exacerbation, and 3+ comorbidities: breast CA w/ radiation, chemo, L breast lumpectomy 2002, HLD, fibromyalgia, DM,  R knee arthroscopy, C4-6 ACDF 2021, L shoulder surgery, post-polio syndrome  are also affecting patient's functional outcome.   REHAB POTENTIAL: Good  CLINICAL DECISION MAKING: Evolving/moderate complexity  EVALUATION COMPLEXITY: Moderate   GOALS: Goals reviewed with patient? Yes  SHORT TERM GOALS: Target date: 10/06/2022   Patient will be independent with initial HEP.  Baseline: needs Goal status: MET 10/01/22   LONG TERM GOALS: Target date: 11/17/2022   Patient will be independent with advanced/ongoing HEP to improve outcomes and carryover.  Baseline:  Goal status: IN PROGRESS  2.  Patient will report 50% improvement in low back pain to improve QOL.  Baseline:  Goal status: IN PROGRESS  3.  Patient will demonstrate improved functional strength as demonstrated by 5x STS < 20 sec (MCID 5 sec). Baseline: 25 seconds  Goal status: IN PROGRESS  4.  Patient will report at least 6 points improvement on modified Oswestry to demonstrate improved functional ability.  Baseline: 24/50 Goal status: IN PROGRESS   5.  Patient will be able to step up/down curb safely with LRAD for safety with community ambulation.  Baseline: catching toe and falling frequently Goal status: IN PROGRESS   6.  Patient will demonstrate at least 19/24 on DGI to improve gait stability and reduce risk for falls. Baseline: 11/24 Goal status: IN PROGRESS  7.  Patient will score >49/56 on Berg Balance test to demonstrate lower risk of falls.   Baseline: 44/56 Goal status: IN PROGRESS  8.  Patient will demonstrate gait speed of >0.8 m/s to be a safe community ambulator with decreased risk for recurrent falls.  Baseline: 0.46 m/s Goal status: IN PROGRESS    PLAN:  PT FREQUENCY: 1-2x/week  PT DURATION: 8 weeks  PLANNED INTERVENTIONS: Therapeutic exercises, Therapeutic activity, Neuromuscular re-education, Balance training, Gait training, Patient/Family education, Self Care, Joint mobilization, Stair  training, Dry Needling, Spinal mobilization, Cryotherapy, Moist heat, Manual therapy, and Re-evaluation.  PLAN FOR NEXT SESSION: continue exercises for hip strengthening and core strengthening as tolerated.  Needs both back, core and LE for back pain and balance.    Darleene Cleaver, PTA 10/01/2022, 2:48 PM

## 2022-10-06 ENCOUNTER — Ambulatory Visit: Payer: Medicare PPO

## 2022-10-08 ENCOUNTER — Ambulatory Visit: Payer: Medicare PPO

## 2022-10-08 DIAGNOSIS — M5459 Other low back pain: Secondary | ICD-10-CM

## 2022-10-08 DIAGNOSIS — R2689 Other abnormalities of gait and mobility: Secondary | ICD-10-CM

## 2022-10-08 DIAGNOSIS — M6281 Muscle weakness (generalized): Secondary | ICD-10-CM

## 2022-10-08 NOTE — Therapy (Signed)
OUTPATIENT PHYSICAL THERAPY TREATMENT   Patient Name: Linda [name] MRN: [id number] DOB:Sep 02, 1950, 72 y.o., female Today's Date: 10/08/2022  END OF SESSION:  PT End of Session - 10/08/22 1511     Visit Number 5    Date for PT Re-Evaluation 11/17/22    Authorization Type [employer] MCR    Authorization Time Period 09/22/22- 11/17/22    Authorization - Number of Visits 16    Progress Note Due on Visit 10    PT Start Time 1446    PT Stop Time 1536    PT Time Calculation (min) 50 min    Activity Tolerance Patient tolerated treatment well    Behavior During Therapy [facility] for tasks assessed/performed               Past Medical History:  Diagnosis Date   Allergic rhinitis    Arthritis    Breast cancer (HCC) 2002   left   Diabetes (HCC)    Fibromyalgia    Glaucoma    Goals of care, counseling/discussion 12/18/2019   Hypercholesteremia    Hyperlipidemia    Personal history of chemotherapy 05/2000   left breast   Personal history of radiation therapy 2002   Letf breast   Stage II infiltrating ductal breast carcinoma, ER-, left (HCC) 12/18/2019   Past Surgical History:  Procedure Laterality Date   APPENDECTOMY  1989   BREAST BIOPSY Left 04/26/2000   BREAST LUMPECTOMY Left 05/14/2000   left   CHOLECYSTECTOMY  1989   KNEE ARTHROSCOPY Right    NECK SURGERY  06/01/2019   SHOULDER SURGERY Left    VAGINAL HYSTERECTOMY  1987   Patient Active Problem List   Diagnosis Date Noted   Stage II infiltrating ductal breast carcinoma, ER-, left (HCC) 12/18/2019   Goals of care, counseling/discussion 12/18/2019   IDA (iron deficiency anemia) 12/19/2018   Fatigue 12/10/2015   Primary osteoarthritis of both hands 12/10/2015   Low back pain without sciatica 12/10/2015   Type 2 diabetes mellitus  12/10/2015   Depression 12/10/2015   Chronic pansinusitis 09/26/2015   Genetic testing 01/11/2014   Hives 11/11/2012   Chest pain 04/23/2012   Hyperlipidemia    Chronic insomnia  01/29/2011   Perennial allergic rhinitis 01/29/2011   Fibromyalgia 01/29/2011    PCP: Renaye Rakers, MD   REFERRING PROVIDER: Raylene Miyamoto, PA-C   REFERRING DIAG: M51.36 (ICD-10-CM) - DDD (degenerative disc disease), lumbar R26.9 (ICD-10-CM) - Gait abnormality  Rationale for Evaluation and Treatment: Rehabilitation  THERAPY DIAG:  Other abnormalities of gait and mobility  Muscle weakness (generalized)  Other low back pain  ONSET DATE: ongoing chronic, got worse over last few months.   SUBJECTIVE:  SUBJECTIVE STATEMENT: Pt reports her pain goes up and down, today isn't bad but she does have most pain in her knees.   PERTINENT HISTORY:  breast CA w/ radiation, chemo, L breast lumpectomy 2002, HLD, fibromyalgia, DM, R knee arthroscopy, C4-6 ACDF 2021, L shoulder surgery, post-polio syndrome, diabetes.    PAIN:  Are you having pain? Yes: NPRS scale: 3/10 Pain location: B knees; 1/10 in low back Pain description: achy, intermittent numbness and tingling down both legs to feet (none currently( Aggravating factors: everything - walking too much, standing too much, lifting, sometimes just too much Relieving factors: pain pill  PRECAUTIONS: Fall and Other: h/o cancer  RED FLAGS: None   WEIGHT BEARING RESTRICTIONS: No  FALLS:  Has patient fallen in last 6 months? Yes. Number of falls 5  LIVING ENVIRONMENT: Lives with: lives with their spouse Lives in: House/apartment Stairs: Yes: External: 1 steps; none Has following equipment at home: Single point cane and Grab bars  OCCUPATION: retired  PLOF: Leisure: reading, occasionally uses floor bike  PATIENT GOALS: stop falling  NEXT MD VISIT: sees every three months  OBJECTIVE:   DIAGNOSTIC FINDINGS:  11/08/2022 MRI Lumbar  spine IMPRESSION: L4-5: Facet osteoarthritis with 3 mm of anterolisthesis. Bulging of the disc. Stenosis of the subarticular lateral recesses left worse than right. Neural compression could occur at this level, particularly in the left lateral recess. The facet arthropathy could be a cause of back pain or referred facet syndrome pain. This is new since 2008.  PATIENT SURVEYS:  Modified Oswestry 24/50   COGNITION: Overall cognitive status: Within functional limits for tasks assessed     SENSATION: Light touch: Impaired  slightly diminished L 3 dermatome, otherwise WNL  MUSCLE LENGTH: NT  POSTURE:  leg length discrepancy, LLE shorter, L foot several sizes smaller  PALPATION:   LUMBAR ROM:   AROM eval  Flexion Above knee, pain R Side  Extension 50% limited, pain R side  Right lateral flexion To knee, pain R side  Left lateral flexion To knee, pain R side  Right rotation WNL, pain R side  Left rotation WNL, pain R side   (Blank rows = not tested)  LOWER EXTREMITY ROM:     fusion in L ankle limits DF, 10 deg DF on R, 5 deg on L.   LOWER EXTREMITY MMT:    MMT Right eval Left eval  Hip flexion 4 4-  Hip extension    Hip abduction 5 5  Hip adduction 4+ 4+  Knee flexion 4+ 3  Knee extension 5 4  Ankle dorsiflexion 3 3  Ankle plantarflexion 4* nwb 3* nwb   (Blank rows = not tested)  LUMBAR SPECIAL TESTS:  NT  FUNCTIONAL TESTS:  5 times sit to stand: 25 seconds Berg Balance Scale: 44/56 Dynamic Gait Index: 11/24 MCTSIB: Condition 1: Avg of 3 trials: 30 sec, Condition 2: Avg of 3 trials: 30 sec, Condition 3: Avg of 3 trials: 30 sec, Condition 4: Avg of 3 trials: 30 sec, and Total Score: 120/120    GAIT: Distance walked: 27' Assistive device utilized: Single point cane Level of assistance: Modified independence Comments: visually slow gait speed, decreased heel strike, limited L ankle DF, L foot smaller than R foot.       TODAY'S TREATMENT:  DATE:  10/08/22 Therapeutic Exercise: to improve strength and mobility.  Demo, verbal and tactile cues throughout for technique. Nustep L4 x 6 min  Hooklying lower trunk rotation 10x3" each way Bridge with TrA x 10  Hookyling hip ADD with ball and TrA 2 x 10  Supine heel slides x 10 bil Seated hamstring curls RTB x 10 R; L side no resistance Standing toe raise x 10  Retro step x 15 bil Runner stretch x 30 sec bil  GAIT TRAINING:  180 ft with cane--- genu recurvatum on L knee at midstance, decreased arm swing on L, decreased DF  10/01/22 Therapeutic Exercise: to improve strength and mobility.  Demo, verbal and tactile cues throughout for technique. Nustep L3 x 5 min - level dropped d/t flare up Gait 180 ft with SPC Seated 3 way lumbar flexion AROM with green pball x 10  Seated heel slides x 10 bil Seated LAQ x 10 bil Supine LTR x 10   Manual Therapy: to decrease muscle spasm, pain and improve mobility.  STM to bil glutes and piriformis  09/29/22 Therapeutic Exercise: to improve strength and mobility.  Demo, verbal and tactile cues throughout for technique. Nustep L4 x 6 min  Seated ab sets orange pball 2x10 3" pause Seated marching bil TrA set x 12 Seated LAQ with TrA  x 10  Heel raises x 10  Church pews x 10 - mirror used for visual feedback, more weight on R side Retro step x 10 B- more difficult with LLE behind Clock balance 1/2 way around R/L 2x using cane with RLE Supine PPT x 10  Supine LTR x 10 each way Figure 4 piriformis stretch x 30 sec bil Bridges with TrA x 10   09/24/22 Therapeutic Activity:  assessing gait DGI - 11/24 Gait speed 6 m/13.3 seconds = 0.46m/s Therapeutic Exercise: to improve strength and mobility.  Demo, verbal and tactile cues throughout for technique. Nustep L4 x 5 min  Supine PPT x 10  Supine PPT with march x 10  Supine PPT with pillow  squeeze x 10  Supine PPT with bent knee fall outs x 10 each Seated Toe raises  x 10 bil Seated heel raises x 10 bil Seated LAQ x 10 each Seated march/hip abduction x 10 each    09/22/22 Self Care: Findings, POC, safety (already using cane), checked cane height, cues for using cane.  Also provided information on stores that have split shoe programs.    PATIENT EDUCATION:  Education details: HEP update- toe raise and retro step Person educated: Patient Education method: Explanation, Demonstration, Verbal cues, and Handouts Education comprehension: verbalized understanding and returned demonstration  HOME EXERCISE PROGRAM: Access Code: [email or web address] URL: [email or web address] Date: 10/08/2022 Prepared by: Verta Ellen  Exercises - Supine Posterior Pelvic Tilt  - 1 x daily - 7 x weekly - 2 sets - 10 reps - Supine Lower Trunk Rotation  - 1 x daily - 7 x weekly - 2 sets - 10 reps - Posterior Pelvic Tilt with Adduction Using Pillow in Hooklying  - 1 x daily - 7 x weekly - 2 sets - 10 reps - Bent Knee Fallouts with Alternating Legs  - 1 x daily - 7 x weekly - 2 sets - 10 reps - Seated Heel Toe Raises  - 1 x daily - 7 x weekly - 2 sets - 10 reps - Seated March  - 1 x daily - 7 x weekly - 2 sets - 10 reps - Seated Long  Arc Quad  - 1 x daily - 7 x weekly - 2 sets - 10 reps - Staggered Stance Forward Backward Weight Shift with Unilateral Counter Support  - 1 x daily - 7 x weekly - 2 sets - 10 reps - Toe Raises with Counter Support  - 1 x daily - 7 x weekly - 3 sets - 10 reps  ASSESSMENT:  CLINICAL IMPRESSION: Pt tolerated the progression of exercises well with no subjective reports of increased pain. She needs more work on quad exercises for involuntary contraction d/t L genu recurvatum in midstance. At times she tends to show decreased ankle dorsiflexion when walking as well. She has also met LTG #2 for pain. Rutherford Limerick continues to demonstrate potential for improvement  and would benefit from continued skilled therapy to address impairments.      OBJECTIVE IMPAIRMENTS: Abnormal gait, decreased activity tolerance, decreased balance, decreased endurance, decreased mobility, difficulty walking, decreased ROM, decreased strength, increased muscle spasms, impaired flexibility, and pain.   ACTIVITY LIMITATIONS: carrying, lifting, bending, standing, and locomotion level  PARTICIPATION LIMITATIONS: meal prep, cleaning, laundry, shopping, and community activity  PERSONAL FACTORS: Age, Time since onset of injury/illness/exacerbation, and 3+ comorbidities: breast CA w/ radiation, chemo, L breast lumpectomy 2002, HLD, fibromyalgia, DM, R knee arthroscopy, C4-6 ACDF 2021, L shoulder surgery, post-polio syndrome  are also affecting patient's functional outcome.   REHAB POTENTIAL: Good  CLINICAL DECISION MAKING: Evolving/moderate complexity  EVALUATION COMPLEXITY: Moderate   GOALS: Goals reviewed with patient? Yes  SHORT TERM GOALS: Target date: 10/06/2022   Patient will be independent with initial HEP.  Baseline: needs Goal status: MET 10/01/22   LONG TERM GOALS: Target date: 11/17/2022   Patient will be independent with advanced/ongoing HEP to improve outcomes and carryover.  Baseline:  Goal status: IN PROGRESS  2.  Patient will report 50% improvement in low back pain to improve QOL.  Baseline:  Goal status: MET- 10/08/22 50%  3.  Patient will demonstrate improved functional strength as demonstrated by 5x STS < 20 sec (MCID 5 sec). Baseline: 25 seconds  Goal status: IN PROGRESS  4.  Patient will report at least 6 points improvement on modified Oswestry to demonstrate improved functional ability.  Baseline: 24/50 Goal status: IN PROGRESS   5.  Patient will be able to step up/down curb safely with LRAD for safety with community ambulation.  Baseline: catching toe and falling frequently Goal status: IN PROGRESS   6.  Patient will demonstrate at least  19/24 on DGI to improve gait stability and reduce risk for falls. Baseline: 11/24 Goal status: IN PROGRESS  7.  Patient will score >49/56 on Berg Balance test to demonstrate lower risk of falls.   Baseline: 44/56 Goal status: IN PROGRESS  8.  Patient will demonstrate gait speed of >0.8 m/s to be a safe community ambulator with decreased risk for recurrent falls.  Baseline: 0.46 m/s Goal status: IN PROGRESS    PLAN:  PT FREQUENCY: 1-2x/week  PT DURATION: 8 weeks  PLANNED INTERVENTIONS: Therapeutic exercises, Therapeutic activity, Neuromuscular re-education, Balance training, Gait training, Patient/Family education, Self Care, Joint mobilization, Stair training, Dry Needling, Spinal mobilization, Cryotherapy, Moist heat, Manual therapy, and Re-evaluation.  PLAN FOR NEXT SESSION: continue exercises for hip strengthening and core strengthening as tolerated.  Needs both back, core and LE for back pain and balance.    Darleene Cleaver, PTA 10/08/2022, 3:45 PM

## 2022-10-13 ENCOUNTER — Ambulatory Visit: Payer: Medicare PPO | Admitting: Physical Therapy

## 2022-10-13 DIAGNOSIS — R2689 Other abnormalities of gait and mobility: Secondary | ICD-10-CM

## 2022-10-13 DIAGNOSIS — M6281 Muscle weakness (generalized): Secondary | ICD-10-CM | POA: Diagnosis not present

## 2022-10-13 DIAGNOSIS — M5459 Other low back pain: Secondary | ICD-10-CM

## 2022-10-13 NOTE — Therapy (Signed)
OUTPATIENT PHYSICAL THERAPY TREATMENT   Patient Name: Linda [name] MRN: [id number] DOB:September 18, 1950, 72 y.o., female Today's Date: 10/13/2022  END OF SESSION:  PT End of Session - 10/13/22 1409     Visit Number 6    Date for PT Re-Evaluation 11/17/22    Authorization Type [employer] MCR    Authorization Time Period 09/22/22- 11/17/22    Authorization - Number of Visits 16    Progress Note Due on Visit 10    PT Start Time 1403    PT Stop Time 1444    PT Time Calculation (min) 41 min    Activity Tolerance Patient tolerated treatment well    Behavior During Therapy [facility] for tasks assessed/performed                Past Medical History:  Diagnosis Date   Allergic rhinitis    Arthritis    Breast cancer (HCC) 2002   left   Diabetes (HCC)    Fibromyalgia    Glaucoma    Goals of care, counseling/discussion 12/18/2019   Hypercholesteremia    Hyperlipidemia    Personal history of chemotherapy 05/2000   left breast   Personal history of radiation therapy 2002   Letf breast   Stage II infiltrating ductal breast carcinoma, ER-, left (HCC) 12/18/2019   Past Surgical History:  Procedure Laterality Date   APPENDECTOMY  1989   BREAST BIOPSY Left 04/26/2000   BREAST LUMPECTOMY Left 05/14/2000   left   CHOLECYSTECTOMY  1989   KNEE ARTHROSCOPY Right    NECK SURGERY  06/01/2019   SHOULDER SURGERY Left    VAGINAL HYSTERECTOMY  1987   Patient Active Problem List   Diagnosis Date Noted   Stage II infiltrating ductal breast carcinoma, ER-, left (HCC) 12/18/2019   Goals of care, counseling/discussion 12/18/2019   IDA (iron deficiency anemia) 12/19/2018   Fatigue 12/10/2015   Primary osteoarthritis of both hands 12/10/2015   Low back pain without sciatica 12/10/2015   Type 2 diabetes mellitus  12/10/2015   Depression 12/10/2015   Chronic pansinusitis 09/26/2015   Genetic testing 01/11/2014   Hives 11/11/2012   Chest pain 04/23/2012   Hyperlipidemia    Chronic insomnia  01/29/2011   Perennial allergic rhinitis 01/29/2011   Fibromyalgia 01/29/2011    PCP: Renaye Rakers, MD   REFERRING PROVIDER: Raylene Miyamoto, PA-C   REFERRING DIAG: M51.36 (ICD-10-CM) - DDD (degenerative disc disease), lumbar R26.9 (ICD-10-CM) - Gait abnormality  Rationale for Evaluation and Treatment: Rehabilitation  THERAPY DIAG:  Other abnormalities of gait and mobility  Muscle weakness (generalized)  Other low back pain  ONSET DATE: ongoing chronic, got worse over last few months.   SUBJECTIVE:  SUBJECTIVE STATEMENT: Pt reports continued B knee pain and LBP d/t fibromyalgia.   PERTINENT HISTORY:  breast CA w/ radiation, chemo, L breast lumpectomy 2002, HLD, fibromyalgia, DM, R knee arthroscopy, C4-6 ACDF 2021, L shoulder surgery, post-polio syndrome, diabetes.    PAIN:  Are you having pain? Yes: NPRS scale: 3/10 Pain location: B knees; 1/10 in low back Pain description: achy, intermittent numbness and tingling down both legs to feet (none currently( Aggravating factors: everything - walking too much, standing too much, lifting, sometimes just too much Relieving factors: pain pill  PRECAUTIONS: Fall and Other: h/o cancer  RED FLAGS: None   WEIGHT BEARING RESTRICTIONS: No  FALLS:  Has patient fallen in last 6 months? Yes. Number of falls 5  LIVING ENVIRONMENT: Lives with: lives with their spouse Lives in: House/apartment Stairs: Yes: External: 1 steps; none Has following equipment at home: Single point cane and Grab bars  OCCUPATION: retired  PLOF: Leisure: reading, occasionally uses floor bike  PATIENT GOALS: stop falling  NEXT MD VISIT: sees every three months  OBJECTIVE:   DIAGNOSTIC FINDINGS:  11/08/2022 MRI Lumbar spine IMPRESSION: L4-5: Facet  osteoarthritis with 3 mm of anterolisthesis. Bulging of the disc. Stenosis of the subarticular lateral recesses left worse than right. Neural compression could occur at this level, particularly in the left lateral recess. The facet arthropathy could be a cause of back pain or referred facet syndrome pain. This is new since 2008.  PATIENT SURVEYS:  Modified Oswestry 24/50   COGNITION: Overall cognitive status: Within functional limits for tasks assessed     SENSATION: Light touch: Impaired  slightly diminished L 3 dermatome, otherwise WNL  MUSCLE LENGTH: NT  POSTURE:  leg length discrepancy, LLE shorter, L foot several sizes smaller  PALPATION:   LUMBAR ROM:   AROM eval  Flexion Above knee, pain R Side  Extension 50% limited, pain R side  Right lateral flexion To knee, pain R side  Left lateral flexion To knee, pain R side  Right rotation WNL, pain R side  Left rotation WNL, pain R side   (Blank rows = not tested)  LOWER EXTREMITY ROM:     fusion in L ankle limits DF, 10 deg DF on R, 5 deg on L.   LOWER EXTREMITY MMT:    MMT Right eval Left eval  Hip flexion 4 4-  Hip extension    Hip abduction 5 5  Hip adduction 4+ 4+  Knee flexion 4+ 3  Knee extension 5 4  Ankle dorsiflexion 3 3  Ankle plantarflexion 4* nwb 3* nwb   (Blank rows = not tested)  LUMBAR SPECIAL TESTS:  NT  FUNCTIONAL TESTS:  5 times sit to stand: 25 seconds Berg Balance Scale: 44/56 Dynamic Gait Index: 11/24 MCTSIB: Condition 1: Avg of 3 trials: 30 sec, Condition 2: Avg of 3 trials: 30 sec, Condition 3: Avg of 3 trials: 30 sec, Condition 4: Avg of 3 trials: 30 sec, and Total Score: 120/120    GAIT: Distance walked: 32' Assistive device utilized: Single point cane Level of assistance: Modified independence Comments: visually slow gait speed, decreased heel strike, limited L ankle DF, L foot smaller than R foot.       TODAY'S TREATMENT:  DATE:  10/13/22 Therapeutic Exercise: to improve strength and mobility.  Demo, verbal and tactile cues throughout for technique. Nustep L4 x 6 min  Supine LTR x 10  Supine hip ADD with pillow and TrA 10x5" Hookyling ball squeeze with SAQ 2x5 bil Supine quad stretch x 30 sec bil Manual Therapy: to decrease muscle spasm, pain and improve mobility.  TF joint mobilization grade II for pain bil PROM for bil knees IASTM and STM to bil quads   10/08/22 Therapeutic Exercise: to improve strength and mobility.  Demo, verbal and tactile cues throughout for technique. Nustep L4 x 6 min  Hooklying lower trunk rotation 10x3" each way Bridge with TrA x 10  Hookyling hip ADD with ball and TrA 2 x 10  Supine heel slides x 10 bil Seated hamstring curls RTB x 10 R; L side no resistance Standing toe raise x 10  Retro step x 15 bil Runner stretch x 30 sec bil  GAIT TRAINING:  180 ft with cane--- genu recurvatum on L knee at midstance, decreased arm swing on L, decreased DF  10/01/22 Therapeutic Exercise: to improve strength and mobility.  Demo, verbal and tactile cues throughout for technique. Nustep L3 x 5 min - level dropped d/t flare up Gait 180 ft with SPC Seated 3 way lumbar flexion AROM with green pball x 10  Seated heel slides x 10 bil Seated LAQ x 10 bil Supine LTR x 10   Manual Therapy: to decrease muscle spasm, pain and improve mobility.  STM to bil glutes and piriformis  09/29/22 Therapeutic Exercise: to improve strength and mobility.  Demo, verbal and tactile cues throughout for technique. Nustep L4 x 6 min  Seated ab sets orange pball 2x10 3" pause Seated marching bil TrA set x 12 Seated LAQ with TrA  x 10  Heel raises x 10  [employer] pews x 10 - mirror used for visual feedback, more weight on R side Retro step x 10 B- more difficult with LLE behind Clock balance 1/2 way around R/L 2x using cane with RLE Supine  PPT x 10  Supine LTR x 10 each way Figure 4 piriformis stretch x 30 sec bil Bridges with TrA x 10   09/24/22 Therapeutic Activity:  assessing gait DGI - 11/24 Gait speed 6 m/13.3 seconds = 0.56m/s Therapeutic Exercise: to improve strength and mobility.  Demo, verbal and tactile cues throughout for technique. Nustep L4 x 5 min  Supine PPT x 10  Supine PPT with march x 10  Supine PPT with pillow squeeze x 10  Supine PPT with bent knee fall outs x 10 each Seated Toe raises  x 10 bil Seated heel raises x 10 bil Seated LAQ x 10 each Seated march/hip abduction x 10 each    09/22/22 Self Care: Findings, POC, safety (already using cane), checked cane height, cues for using cane.  Also provided information on stores that have split shoe programs.    PATIENT EDUCATION:  Education details: HEP update- toe raise and retro step Person educated: Patient Education method: Explanation, Demonstration, Verbal cues, and Handouts Education comprehension: verbalized understanding and returned demonstration  HOME EXERCISE PROGRAM: Access Code: [email or web address] URL: [email or web address] Date: 10/08/2022 Prepared by: Verta Ellen  Exercises - Supine Posterior Pelvic Tilt  - 1 x daily - 7 x weekly - 2 sets - 10 reps - Supine Lower Trunk Rotation  - 1 x daily - 7 x weekly - 2 sets - 10 reps - Posterior Pelvic Tilt with Adduction Using  Pillow in Hooklying  - 1 x daily - 7 x weekly - 2 sets - 10 reps - Bent Knee Fallouts with Alternating Legs  - 1 x daily - 7 x weekly - 2 sets - 10 reps - Seated Heel Toe Raises  - 1 x daily - 7 x weekly - 2 sets - 10 reps - Seated March  - 1 x daily - 7 x weekly - 2 sets - 10 reps - Seated Long Arc Quad  - 1 x daily - 7 x weekly - 2 sets - 10 reps - Staggered Stance Forward Backward Weight Shift with Unilateral Counter Support  - 1 x daily - 7 x weekly - 2 sets - 10 reps - Toe Raises with Counter Support  - 1 x daily - 7 x weekly - 3 sets - 10  reps  ASSESSMENT:  CLINICAL IMPRESSION: Pt continues to have flare ups from fibromyalgia causing B knee and LBP. Exercise tolerance was ok today but she did require more MT for quads after the SAQ w/ ball squeeze.  Did a lot of MT today for pain and to improve mobility overall. Linda [name] continues to demonstrate potential for improvement and would benefit from continued skilled therapy to address impairments.      OBJECTIVE IMPAIRMENTS: Abnormal gait, decreased activity tolerance, decreased balance, decreased endurance, decreased mobility, difficulty walking, decreased ROM, decreased strength, increased muscle spasms, impaired flexibility, and pain.   ACTIVITY LIMITATIONS: carrying, lifting, bending, standing, and locomotion level  PARTICIPATION LIMITATIONS: meal prep, cleaning, laundry, shopping, and community activity  PERSONAL FACTORS: Age, Time since onset of injury/illness/exacerbation, and 3+ comorbidities: breast CA w/ radiation, chemo, L breast lumpectomy 2002, HLD, fibromyalgia, DM, R knee arthroscopy, C4-6 ACDF 2021, L shoulder surgery, post-polio syndrome  are also affecting patient's functional outcome.   REHAB POTENTIAL: Good  CLINICAL DECISION MAKING: Evolving/moderate complexity  EVALUATION COMPLEXITY: Moderate   GOALS: Goals reviewed with patient? Yes  SHORT TERM GOALS: Target date: 10/06/2022   Patient will be independent with initial HEP.  Baseline: needs Goal status: MET 10/01/22   LONG TERM GOALS: Target date: 11/17/2022   Patient will be independent with advanced/ongoing HEP to improve outcomes and carryover.  Baseline:  Goal status: IN PROGRESS  2.  Patient will report 50% improvement in low back pain to improve QOL.  Baseline:  Goal status: MET- 10/08/22 50%  3.  Patient will demonstrate improved functional strength as demonstrated by 5x STS < 20 sec (MCID 5 sec). Baseline: 25 seconds  Goal status: IN PROGRESS  4.  Patient will report at  least 6 points improvement on modified Oswestry to demonstrate improved functional ability.  Baseline: 24/50 Goal status: IN PROGRESS   5.  Patient will be able to step up/down curb safely with LRAD for safety with community ambulation.  Baseline: catching toe and falling frequently Goal status: IN PROGRESS   6.  Patient will demonstrate at least 19/24 on DGI to improve gait stability and reduce risk for falls. Baseline: 11/24 Goal status: IN PROGRESS  7.  Patient will score >49/56 on Berg Balance test to demonstrate lower risk of falls.   Baseline: 44/56 Goal status: IN PROGRESS  8.  Patient will demonstrate gait speed of >0.8 m/s to be a safe community ambulator with decreased risk for recurrent falls.  Baseline: 0.46 m/s Goal status: IN PROGRESS    PLAN:  PT FREQUENCY: 1-2x/week  PT DURATION: 8 weeks  PLANNED INTERVENTIONS: Therapeutic exercises, Therapeutic activity, Neuromuscular re-education,  Balance training, Gait training, Patient/Family education, Self Care, Joint mobilization, Stair training, Dry Needling, Spinal mobilization, Cryotherapy, Moist heat, Manual therapy, and Re-evaluation.  PLAN FOR NEXT SESSION: continue exercises for hip strengthening and core strengthening as tolerated.  Needs both back, core and LE for back pain and balance.    Darleene Cleaver, PTA 10/13/2022, 2:46 PM

## 2022-10-15 ENCOUNTER — Ambulatory Visit: Payer: Medicare PPO

## 2022-10-15 DIAGNOSIS — M6281 Muscle weakness (generalized): Secondary | ICD-10-CM | POA: Diagnosis not present

## 2022-10-15 DIAGNOSIS — M5459 Other low back pain: Secondary | ICD-10-CM

## 2022-10-15 DIAGNOSIS — R2689 Other abnormalities of gait and mobility: Secondary | ICD-10-CM

## 2022-10-15 NOTE — Therapy (Signed)
OUTPATIENT PHYSICAL THERAPY TREATMENT   Patient Name: Linda [name] MRN: [id number] DOB:1950/12/12, 72 y.o., female Today's Date: 10/15/2022  END OF SESSION:  PT End of Session - 10/15/22 1411     Visit Number 7    Date for PT Re-Evaluation 11/17/22    Authorization Type [employer] M[employer]    Authorization Time Period 09/22/22- 11/17/22    Authorization - Number of Visits 16    Progress Note Due on Visit 10    PT Start Time 1404    PT Stop Time 1446    PT Time Calculation (min) 42 min    Activity Tolerance Patient tolerated treatment well    Behavior During Therapy [facility] for tasks assessed/performed                 Past Medical History:  Diagnosis Date   Allergic rhinitis    Arthritis    Breast cancer (HCC) 2002   left   Diabetes (HCC)    Fibromyalgia    Glaucoma    Goals of care, counseling/discussion 12/18/2019   Hypercholesteremia    Hyperlipidemia    Personal history of chemotherapy 05/2000   left breast   Personal history of radiation therapy 2002   Letf breast   Stage II infiltrating ductal breast carcinoma, ER-, left (HCC) 12/18/2019   Past Surgical History:  Procedure Laterality Date   APPENDECTOMY  1989   BREAST BIOPSY Left 04/26/2000   BREAST LUMPECTOMY Left 05/14/2000   left   CHOLECYSTECTOMY  1989   KNEE ARTHROSCOPY Right    NECK SURGERY  06/01/2019   SHOULDER SURGERY Left    VAGINAL HYSTERECTOMY  1987   Patient Active Problem List   Diagnosis Date Noted   Stage II infiltrating ductal breast carcinoma, ER-, left (HCC) 12/18/2019   Goals of care, counseling/discussion 12/18/2019   IDA (iron deficiency anemia) 12/19/2018   Fatigue 12/10/2015   Primary osteoarthritis of both hands 12/10/2015   Low back pain without sciatica 12/10/2015   Type 2 diabetes mellitus  12/10/2015   Depression 12/10/2015   Chronic pansinusitis 09/26/2015   Genetic testing 01/11/2014   Hives 11/11/2012   Chest pain 04/23/2012   Hyperlipidemia    Chronic insomnia  01/29/2011   Perennial allergic rhinitis 01/29/2011   Fibromyalgia 01/29/2011    PCP: Renaye Rakers, MD   REFERRING PROVIDER: Raylene Miyamoto, PA-C   REFERRING DIAG: M51.36 (ICD-10-CM) - DDD (degenerative disc disease), lumbar R26.9 (ICD-10-CM) - Gait abnormality  Rationale for Evaluation and Treatment: Rehabilitation  THERAPY DIAG:  Other abnormalities of gait and mobility  Muscle weakness (generalized)  Other low back pain  ONSET DATE: ongoing chronic, got worse over last few months.   SUBJECTIVE:  SUBJECTIVE STATEMENT: Pt reports she feels much better compared to the last time she was here.   PERTINENT HISTORY:  breast CA w/ radiation, chemo, L breast lumpectomy 2002, HLD, fibromyalgia, DM, R knee arthroscopy, C4-6 ACDF 2021, L shoulder surgery, post-polio syndrome, diabetes.    PAIN:  Are you having pain? Yes: NPRS scale: 1/10 Pain location: B knees; 1/10 in low back Pain description: achy, intermittent numbness and tingling down both legs to feet (none currently( Aggravating factors: everything - walking too much, standing too much, lifting, sometimes just too much Relieving factors: pain pill  PRECAUTIONS: Fall and Other: h/o cancer  RED FLAGS: None   WEIGHT BEARING RESTRICTIONS: No  FALLS:  Has patient fallen in last 6 months? Yes. Number of falls 5  LIVING ENVIRONMENT: Lives with: lives with their spouse Lives in: House/apartment Stairs: Yes: External: 1 steps; none Has following equipment at home: Single point cane and Grab bars  OCCUPATION: retired  PLOF: Leisure: reading, occasionally uses floor bike  PATIENT GOALS: stop falling  NEXT MD VISIT: sees every three months  OBJECTIVE:   DIAGNOSTIC FINDINGS:  11/08/2022 MRI Lumbar spine  IMPRESSION: L4-5: Facet osteoarthritis with 3 mm of anterolisthesis. Bulging of the disc. Stenosis of the subarticular lateral recesses left worse than right. Neural compression could occur at this level, particularly in the left lateral recess. The facet arthropathy could be a cause of back pain or referred facet syndrome pain. This is new since 2008.  PATIENT SURVEYS:  Modified Oswestry 24/50   COGNITION: Overall cognitive status: Within functional limits for tasks assessed     SENSATION: Light touch: Impaired  slightly diminished L 3 dermatome, otherwise WNL  MUSCLE LENGTH: NT  POSTURE:  leg length discrepancy, LLE shorter, L foot several sizes smaller  PALPATION:   LUMBAR ROM:   AROM eval  Flexion Above knee, pain R Side  Extension 50% limited, pain R side  Right lateral flexion To knee, pain R side  Left lateral flexion To knee, pain R side  Right rotation WNL, pain R side  Left rotation WNL, pain R side   (Blank rows = not tested)  LOWER EXTREMITY ROM:     fusion in L ankle limits DF, 10 deg DF on R, 5 deg on L.   LOWER EXTREMITY MMT:    MMT Right eval Left eval  Hip flexion 4 4-  Hip extension    Hip abduction 5 5  Hip adduction 4+ 4+  Knee flexion 4+ 3  Knee extension 5 4  Ankle dorsiflexion 3 3  Ankle plantarflexion 4* nwb 3* nwb   (Blank rows = not tested)  LUMBAR SPECIAL TESTS:  NT  FUNCTIONAL TESTS:  5 times sit to stand: 25 seconds Berg Balance Scale: 44/56 Dynamic Gait Index: 11/24 MCTSIB: Condition 1: Avg of 3 trials: 30 sec, Condition 2: Avg of 3 trials: 30 sec, Condition 3: Avg of 3 trials: 30 sec, Condition 4: Avg of 3 trials: 30 sec, and Total Score: 120/120    GAIT: Distance walked: 35' Assistive device utilized: Single point cane Level of assistance: Modified independence Comments: visually slow gait speed, decreased heel strike, limited L ankle DF, L foot smaller than R foot.       TODAY'S TREATMENT:  DATE:  10/15/22 Therapeutic Exercise: to improve strength and mobility.  Demo, verbal and tactile cues throughout for technique. Nustep L4 x 6 min  Mini squats x 8 - stopped d/t audible crepitus in knees Standing back to wall toe raises 2x10  Therapeutic Activity: to improve functional performance. Practiced curb navigation outside with instruction provided for proper step to pattern with cane and safety, SBA given Step ups x 10 6' step   Manual Therapy: to decrease muscle spasm, pain and improve mobility.  PROM for bil knees IASTM and STM to L quads  10/13/22 Therapeutic Exercise: to improve strength and mobility.  Demo, verbal and tactile cues throughout for technique. Nustep L4 x 6 min  Supine LTR x 10  Supine hip ADD with pillow and TrA 10x5" Hookyling ball squeeze with SAQ 2x5 bil Supine quad stretch x 30 sec bil Manual Therapy: to decrease muscle spasm, pain and improve mobility.  TF joint mobilization grade II for pain bil PROM for bil knees IASTM and STM to bil quads   10/08/22 Therapeutic Exercise: to improve strength and mobility.  Demo, verbal and tactile cues throughout for technique. Nustep L4 x 6 min  Hooklying lower trunk rotation 10x3" each way Bridge with TrA x 10  Hookyling hip ADD with ball and TrA 2 x 10  Supine heel slides x 10 bil Seated hamstring curls RTB x 10 R; L side no resistance Standing toe raise x 10  Retro step x 15 bil Runner stretch x 30 sec bil  GAIT TRAINING:  180 ft with cane--- genu recurvatum on L knee at midstance, decreased arm swing on L, decreased DF  10/01/22 Therapeutic Exercise: to improve strength and mobility.  Demo, verbal and tactile cues throughout for technique. Nustep L3 x 5 min - level dropped d/t flare up Gait 180 ft with SPC Seated 3 way lumbar flexion AROM with green pball x 10  Seated heel slides x 10 bil Seated LAQ x  10 bil Supine LTR x 10   Manual Therapy: to decrease muscle spasm, pain and improve mobility.  STM to bil glutes and piriformis  09/29/22 Therapeutic Exercise: to improve strength and mobility.  Demo, verbal and tactile cues throughout for technique. Nustep L4 x 6 min  Seated ab sets orange pball 2x10 3" pause Seated marching bil TrA set x 12 Seated LAQ with TrA  x 10  Heel raises x 10  [employer] pews x 10 - mirror used for visual feedback, more weight on R side Retro step x 10 B- more difficult with LLE behind Clock balance 1/2 way around R/L 2x using cane with RLE Supine PPT x 10  Supine LTR x 10 each way Figure 4 piriformis stretch x 30 sec bil Bridges with TrA x 10   09/24/22 Therapeutic Activity:  assessing gait DGI - 11/24 Gait speed 6 m/13.3 seconds = 0.83m/s Therapeutic Exercise: to improve strength and mobility.  Demo, verbal and tactile cues throughout for technique. Nustep L4 x 5 min  Supine PPT x 10  Supine PPT with march x 10  Supine PPT with pillow squeeze x 10  Supine PPT with bent knee fall outs x 10 each Seated Toe raises  x 10 bil Seated heel raises x 10 bil Seated LAQ x 10 each Seated march/hip abduction x 10 each    09/22/22 Self Care: Findings, POC, safety (already using cane), checked cane height, cues for using cane.  Also provided information on stores that have split shoe programs.  PATIENT EDUCATION:  Education details: HEP update- toe raise and retro step Person educated: Patient Education method: Explanation, Demonstration, Verbal cues, and Handouts Education comprehension: verbalized understanding and returned demonstration  HOME EXERCISE PROGRAM: Access Code: [email or web address] URL: [email or web address] Date: 10/08/2022 Prepared by: Verta Ellen  Exercises - Supine Posterior Pelvic Tilt  - 1 x daily - 7 x weekly - 2 sets - 10 reps - Supine Lower Trunk Rotation  - 1 x daily - 7 x weekly - 2 sets - 10 reps - Posterior Pelvic Tilt  with Adduction Using Pillow in Hooklying  - 1 x daily - 7 x weekly - 2 sets - 10 reps - Bent Knee Fallouts with Alternating Legs  - 1 x daily - 7 x weekly - 2 sets - 10 reps - Seated Heel Toe Raises  - 1 x daily - 7 x weekly - 2 sets - 10 reps - Seated March  - 1 x daily - 7 x weekly - 2 sets - 10 reps - Seated Long Arc Quad  - 1 x daily - 7 x weekly - 2 sets - 10 reps - Staggered Stance Forward Backward Weight Shift with Unilateral Counter Support  - 1 x daily - 7 x weekly - 2 sets - 10 reps - Toe Raises with Counter Support  - 1 x daily - 7 x weekly - 3 sets - 10 reps  ASSESSMENT:  CLINICAL IMPRESSION: Pt able to navigate curbs safely but needs cuing for safety and proper gait pattern. Pt shows difficulty fully engaging L quads in weight bearing positions. Also has audible crepitus in L knee with wall squats so discontinued. TTP in L quads along lateral quads during manual therapy.  Linda [name] continues to demonstrate potential for improvement and would benefit from continued skilled therapy to address impairments.      OBJECTIVE IMPAIRMENTS: Abnormal gait, decreased activity tolerance, decreased balance, decreased endurance, decreased mobility, difficulty walking, decreased ROM, decreased strength, increased muscle spasms, impaired flexibility, and pain.   ACTIVITY LIMITATIONS: carrying, lifting, bending, standing, and locomotion level  PARTICIPATION LIMITATIONS: meal prep, cleaning, laundry, shopping, and community activity  PERSONAL FACTORS: Age, Time since onset of injury/illness/exacerbation, and 3+ comorbidities: breast CA w/ radiation, chemo, L breast lumpectomy 2002, HLD, fibromyalgia, DM, R knee arthroscopy, C4-6 ACDF 2021, L shoulder surgery, post-polio syndrome  are also affecting patient's functional outcome.   REHAB POTENTIAL: Good  CLINICAL DECISION MAKING: Evolving/moderate complexity  EVALUATION COMPLEXITY: Moderate   GOALS: Goals reviewed with patient?  Yes  SHORT TERM GOALS: Target date: 10/06/2022   Patient will be independent with initial HEP.  Baseline: needs Goal status: MET 10/01/22   LONG TERM GOALS: Target date: 11/17/2022   Patient will be independent with advanced/ongoing HEP to improve outcomes and carryover.  Baseline:  Goal status: IN PROGRESS  2.  Patient will report 50% improvement in low back pain to improve QOL.  Baseline:  Goal status: MET- 10/08/22 50%  3.  Patient will demonstrate improved functional strength as demonstrated by 5x STS < 20 sec (MCID 5 sec). Baseline: 25 seconds  Goal status: IN PROGRESS  4.  Patient will report at least 6 points improvement on modified Oswestry to demonstrate improved functional ability.  Baseline: 24/50 Goal status: IN PROGRESS   5.  Patient will be able to step up/down curb safely with LRAD for safety with community ambulation.  Baseline: catching toe and falling frequently Goal status: IN PROGRESS- 10/15/22 able to do so carefully  6.  Patient will demonstrate at least 19/24 on DGI to improve gait stability and reduce risk for falls. Baseline: 11/24 Goal status: IN PROGRESS  7.  Patient will score >49/56 on Berg Balance test to demonstrate lower risk of falls.   Baseline: 44/56 Goal status: IN PROGRESS  8.  Patient will demonstrate gait speed of >0.8 m/s to be a safe community ambulator with decreased risk for recurrent falls.  Baseline: 0.46 m/s Goal status: IN PROGRESS    PLAN:  PT FREQUENCY: 1-2x/week  PT DURATION: 8 weeks  PLANNED INTERVENTIONS: Therapeutic exercises, Therapeutic activity, Neuromuscular re-education, Balance training, Gait training, Patient/Family education, Self Care, Joint mobilization, Stair training, Dry Needling, Spinal mobilization, Cryotherapy, Moist heat, Manual therapy, and Re-evaluation.  PLAN FOR NEXT SESSION: continue exercises for hip strengthening and core strengthening as tolerated.  Needs both back, core and LE for back pain  and balance.    Darleene Cleaver, PTA 10/15/2022, 4:25 PM

## 2022-10-20 ENCOUNTER — Encounter: Payer: Medicare PPO | Admitting: Physical Therapy

## 2022-10-21 DIAGNOSIS — E1169 Type 2 diabetes mellitus with other specified complication: Secondary | ICD-10-CM | POA: Diagnosis not present

## 2022-10-21 DIAGNOSIS — F064 Anxiety disorder due to known physiological condition: Secondary | ICD-10-CM | POA: Diagnosis not present

## 2022-10-21 DIAGNOSIS — F4323 Adjustment disorder with mixed anxiety and depressed mood: Secondary | ICD-10-CM | POA: Diagnosis not present

## 2022-10-21 DIAGNOSIS — M797 Fibromyalgia: Secondary | ICD-10-CM | POA: Diagnosis not present

## 2022-10-27 ENCOUNTER — Ambulatory Visit: Payer: Medicare PPO | Attending: Neurosurgery

## 2022-10-27 ENCOUNTER — Other Ambulatory Visit: Payer: Self-pay

## 2022-10-27 DIAGNOSIS — M6281 Muscle weakness (generalized): Secondary | ICD-10-CM | POA: Insufficient documentation

## 2022-10-27 DIAGNOSIS — R2689 Other abnormalities of gait and mobility: Secondary | ICD-10-CM | POA: Insufficient documentation

## 2022-10-27 DIAGNOSIS — M5459 Other low back pain: Secondary | ICD-10-CM | POA: Diagnosis not present

## 2022-10-27 NOTE — Therapy (Signed)
OUTPATIENT PHYSICAL THERAPY TREATMENT   Patient Name: Linda [name] MRN: [id number] DOB:[birth year], 72 y.o., female Today's Date: 10/27/2022  END OF SESSION:  PT End of Session - 10/27/22 1412     Visit Number 8    Date for PT Re-Evaluation 11/17/22    Authorization Type [employer] MCR    Authorization Time Period 09/22/22- 11/17/22    Authorization - Number of Visits 16    Progress Note Due on Visit 10    PT Start Time 1410    PT Stop Time 1446    PT Time Calculation (min) 36 min                  Past Medical History:  Diagnosis Date   Allergic rhinitis    Arthritis    Breast cancer (HCC) 2002   left   Diabetes (HCC)    Fibromyalgia    Glaucoma    Goals of care, counseling/discussion 12/18/2019   Hypercholesteremia    Hyperlipidemia    Personal history of chemotherapy 05/2000   left breast   Personal history of radiation therapy 2002   Letf breast   Stage II infiltrating ductal breast carcinoma, ER-, left (HCC) 12/18/2019   Past Surgical History:  Procedure Laterality Date   APPENDECTOMY  1989   BREAST BIOPSY Left 04/26/2000   BREAST LUMPECTOMY Left 05/14/2000   left   CHOLECYSTECTOMY  1989   KNEE ARTHROSCOPY Right    NECK SURGERY  06/01/2019   SHOULDER SURGERY Left    VAGINAL HYSTERECTOMY  1987   Patient Active Problem List   Diagnosis Date Noted   Stage II infiltrating ductal breast carcinoma, ER-, left (HCC) 12/18/2019   Goals of care, counseling/discussion 12/18/2019   IDA (iron deficiency anemia) 12/19/2018   Fatigue 12/10/2015   Primary osteoarthritis of both hands 12/10/2015   Low back pain without sciatica 12/10/2015   Type 2 diabetes mellitus  12/10/2015   Depression 12/10/2015   Chronic pansinusitis 09/26/2015   Genetic testing 01/11/2014   Hives 11/11/2012   Chest pain 04/23/2012   Hyperlipidemia    Chronic insomnia 01/29/2011   Perennial allergic rhinitis 01/29/2011   Fibromyalgia 01/29/2011    PCP: Renaye Rakers, MD    REFERRING PROVIDER: Raylene Miyamoto, PA-C   REFERRING DIAG: M51.36 (ICD-10-CM) - DDD (degenerative disc disease), lumbar R26.9 (ICD-10-CM) - Gait abnormality  Rationale for Evaluation and Treatment: Rehabilitation  THERAPY DIAG:  Other abnormalities of gait and mobility  Muscle weakness (generalized)  Other low back pain  ONSET DATE: ongoing chronic, got worse over last few months.   SUBJECTIVE:  SUBJECTIVE STATEMENT: Pt reports she feels ok, no new problems.  Husband has dementia, has some trouble leaving him at home alone  PERTINENT HISTORY:  breast CA w/ radiation, chemo, L breast lumpectomy 2002, HLD, fibromyalgia, DM, R knee arthroscopy, C4-6 ACDF 2021, L shoulder surgery, post-polio syndrome, diabetes.    PAIN:  Are you having pain? Yes: NPRS scale: 1/10 Pain location: B knees; 1/10 in low back Pain description: achy, intermittent numbness and tingling down both legs to feet (none currently( Aggravating factors: everything - walking too much, standing too much, lifting, sometimes just too much Relieving factors: pain pill  PRECAUTIONS: Fall and Other: h/o cancer  RED FLAGS: None   WEIGHT BEARING RESTRICTIONS: No  FALLS:  Has patient fallen in last 6 months? Yes. Number of falls 5  LIVING ENVIRONMENT: Lives with: lives with their spouse Lives in: House/apartment Stairs: Yes: External: 1 steps; none Has following equipment at home: Single point cane and Grab bars  OCCUPATION: retired  PLOF: Leisure: reading, occasionally uses floor bike  PATIENT GOALS: stop falling  NEXT MD VISIT: sees every three months  OBJECTIVE:   DIAGNOSTIC FINDINGS:  11/08/2022 MRI Lumbar spine IMPRESSION: L4-5: Facet osteoarthritis with 3 mm of anterolisthesis. Bulging of the disc.  Stenosis of the subarticular lateral recesses left worse than right. Neural compression could occur at this level, particularly in the left lateral recess. The facet arthropathy could be a cause of back pain or referred facet syndrome pain. This is new since 2008.  PATIENT SURVEYS:  Modified Oswestry 24/50   COGNITION: Overall cognitive status: Within functional limits for tasks assessed     SENSATION: Light touch: Impaired  slightly diminished L 3 dermatome, otherwise WNL  MUSCLE LENGTH: NT  POSTURE:  leg length discrepancy, LLE shorter, L foot several sizes smaller  PALPATION:   LUMBAR ROM:   AROM eval  Flexion Above knee, pain R Side  Extension 50% limited, pain R side  Right lateral flexion To knee, pain R side  Left lateral flexion To knee, pain R side  Right rotation WNL, pain R side  Left rotation WNL, pain R side   (Blank rows = not tested)  LOWER EXTREMITY ROM:     fusion in L ankle limits DF, 10 deg DF on R, 5 deg on L.   LOWER EXTREMITY MMT:    MMT Right eval Left eval  Hip flexion 4 4-  Hip extension    Hip abduction 5 5  Hip adduction 4+ 4+  Knee flexion 4+ 3  Knee extension 5 4  Ankle dorsiflexion 3 3  Ankle plantarflexion 4* nwb 3* nwb   (Blank rows = not tested)  LUMBAR SPECIAL TESTS:  NT  FUNCTIONAL TESTS:  5 times sit to stand: 25 seconds Berg Balance Scale: 44/56 Dynamic Gait Index: 11/24 MCTSIB: Condition 1: Avg of 3 trials: 30 sec, Condition 2: Avg of 3 trials: 30 sec, Condition 3: Avg of 3 trials: 30 sec, Condition 4: Avg of 3 trials: 30 sec, and Total Score: 120/120    GAIT: Distance walked: 14' Assistive device utilized: Single point cane Level of assistance: Modified independence Comments: visually slow gait speed, decreased heel strike, limited L ankle DF, L foot smaller than R foot.       TODAY'S TREATMENT:  DATE:   10/27/22:  Therapeutic exercise:  Nustep level 5, Ue's and LE's 6 min Supine with legs over 55cm ball, for bridging 15 reps Alt SLR with legs over 55 cm ball, therapist holding ball for stability, 15 reps B knee to chest, engaging hip flexors and lower abs, 15 reps  Standing at sink, red t band around thighs, for alt hip abd, small amplitude movement Standing at sink, red t band around thighs for alt for, back rocks, knees straight, in lunge position  Standing on airex for B heel raises, holding sink B hands.   10/15/22 Therapeutic Exercise: to improve strength and mobility.  Demo, verbal and tactile cues throughout for technique. Nustep L4 x 6 min  Mini squats x 8 - stopped d/t audible crepitus in knees Standing back to wall toe raises 2x10  Therapeutic Activity: to improve functional performance. Practiced curb navigation outside with instruction provided for proper step to pattern with cane and safety, SBA given Step ups x 10 6' step   Manual Therapy: to decrease muscle spasm, pain and improve mobility.  PROM for bil knees IASTM and STM to L quads  10/13/22 Therapeutic Exercise: to improve strength and mobility.  Demo, verbal and tactile cues throughout for technique. Nustep L4 x 6 min  Supine LTR x 10  Supine hip ADD with pillow and TrA 10x5" Hookyling ball squeeze with SAQ 2x5 bil Supine quad stretch x 30 sec bil Manual Therapy: to decrease muscle spasm, pain and improve mobility.  TF joint mobilization grade II for pain bil PROM for bil knees IASTM and STM to bil quads   10/08/22 Therapeutic Exercise: to improve strength and mobility.  Demo, verbal and tactile cues throughout for technique. Nustep L4 x 6 min  Hooklying lower trunk rotation 10x3" each way Bridge with TrA x 10  Hookyling hip ADD with ball and TrA 2 x 10  Supine heel slides x 10 bil Seated hamstring curls RTB x 10 R; L side no resistance Standing toe raise x 10  Retro step x 15  bil Runner stretch x 30 sec bil  GAIT TRAINING:  180 ft with cane--- genu recurvatum on L knee at midstance, decreased arm swing on L, decreased DF  10/01/22 Therapeutic Exercise: to improve strength and mobility.  Demo, verbal and tactile cues throughout for technique. Nustep L3 x 5 min - level dropped d/t flare up Gait 180 ft with SPC Seated 3 way lumbar flexion AROM with green pball x 10  Seated heel slides x 10 bil Seated LAQ x 10 bil Supine LTR x 10   Manual Therapy: to decrease muscle spasm, pain and improve mobility.  STM to bil glutes and piriformis  09/29/22 Therapeutic Exercise: to improve strength and mobility.  Demo, verbal and tactile cues throughout for technique. Nustep L4 x 6 min  Seated ab sets orange pball 2x10 3" pause Seated marching bil TrA set x 12 Seated LAQ with TrA  x 10  Heel raises x 10  [employer] pews x 10 - mirror used for visual feedback, more weight on R side Retro step x 10 B- more difficult with LLE behind Clock balance 1/2 way around R/L 2x using cane with RLE Supine PPT x 10  Supine LTR x 10 each way Figure 4 piriformis stretch x 30 sec bil Bridges with TrA x 10   09/24/22 Therapeutic Activity:  assessing gait DGI - 11/24 Gait speed 6 m/13.3 seconds = 0.2m/s Therapeutic Exercise: to improve strength and mobility.  Demo, verbal and tactile cues throughout for technique. Nustep L4 x 5 min  Supine PPT x 10  Supine PPT with march x 10  Supine PPT with pillow squeeze x 10  Supine PPT with bent knee fall outs x 10 each Seated Toe raises  x 10 bil Seated heel raises x 10 bil Seated LAQ x 10 each Seated march/hip abduction x 10 each    09/22/22 Self Care: Findings, POC, safety (already using cane), checked cane height, cues for using cane.  Also provided information on stores that have split shoe programs.    PATIENT EDUCATION:  Education details: HEP update- toe raise and retro step Person educated: Patient Education method: Explanation,  Demonstration, Verbal cues, and Handouts Education comprehension: verbalized understanding and returned demonstration  HOME EXERCISE PROGRAM: Access Code: [email or web address] URL: [email or web address] Date: 10/08/2022 Prepared by: Verta Ellen  Exercises - Supine Posterior Pelvic Tilt  - 1 x daily - 7 x weekly - 2 sets - 10 reps - Supine Lower Trunk Rotation  - 1 x daily - 7 x weekly - 2 sets - 10 reps - Posterior Pelvic Tilt with Adduction Using Pillow in Hooklying  - 1 x daily - 7 x weekly - 2 sets - 10 reps - Bent Knee Fallouts with Alternating Legs  - 1 x daily - 7 x weekly - 2 sets - 10 reps - Seated Heel Toe Raises  - 1 x daily - 7 x weekly - 2 sets - 10 reps - Seated March  - 1 x daily - 7 x weekly - 2 sets - 10 reps - Seated Long Arc Quad  - 1 x daily - 7 x weekly - 2 sets - 10 reps - Staggered Stance Forward Backward Weight Shift with Unilateral Counter Support  - 1 x daily - 7 x weekly - 2 sets - 10 reps - Toe Raises with Counter Support  - 1 x daily - 7 x weekly - 3 sets - 10 reps  ASSESSMENT:  CLINICAL IMPRESSION: Pt returned today for skilled physical therapy intervention due to B LE weakness and lower back pain.  Tried to avoid excessive torque B knees today due to her pain, crepitus noted last session.  Did note marked hyperextension B knees in standing, perhaps due to degenerative changes B knees?  Tolerated today's Rx well.  Abbreviated session as 10 min late .  Rutherford [place] continues to demonstrate potential for improvement and would benefit from continued skilled therapy to address impairments.      OBJECTIVE IMPAIRMENTS: Abnormal gait, decreased activity tolerance, decreased balance, decreased endurance, decreased mobility, difficulty walking, decreased ROM, decreased strength, increased muscle spasms, impaired flexibility, and pain.   ACTIVITY LIMITATIONS: carrying, lifting, bending, standing, and locomotion level  PARTICIPATION LIMITATIONS: meal prep,  cleaning, laundry, shopping, and community activity  PERSONAL FACTORS: Age, Time since onset of injury/illness/exacerbation, and 3+ comorbidities: breast CA w/ radiation, chemo, L breast lumpectomy 2002, HLD, fibromyalgia, DM, R knee arthroscopy, C4-6 ACDF 2021, L shoulder surgery, post-polio syndrome  are also affecting patient's functional outcome.   REHAB POTENTIAL: Good  CLINICAL DECISION MAKING: Evolving/moderate complexity  EVALUATION COMPLEXITY: Moderate   GOALS: Goals reviewed with patient? Yes  SHORT TERM GOALS: Target date: 10/06/2022   Patient will be independent with initial HEP.  Baseline: needs Goal status: MET 10/01/22   LONG TERM GOALS: Target date: 11/17/2022   Patient will be independent with advanced/ongoing HEP to improve outcomes and carryover.  Baseline:  Goal status: IN PROGRESS  2.  Patient will report 50% improvement in low back pain to improve QOL.  Baseline:  Goal status: MET- 10/08/22 50%  3.  Patient will demonstrate improved functional strength as demonstrated by 5x STS < 20 sec (MCID 5 sec). Baseline: 25 seconds  Goal status: IN PROGRESS  4.  Patient will report at least 6 points improvement on modified Oswestry to demonstrate improved functional ability.  Baseline: 24/50 Goal status: IN PROGRESS   5.  Patient will be able to step up/down curb safely with LRAD for safety with community ambulation.  Baseline: catching toe and falling frequently Goal status: IN PROGRESS- 10/15/22 able to do so carefully  6.  Patient will demonstrate at least 19/24 on DGI to improve gait stability and reduce risk for falls. Baseline: 11/24 Goal status: IN PROGRESS  7.  Patient will score >49/56 on Berg Balance test to demonstrate lower risk of falls.   Baseline: 44/56 Goal status: IN PROGRESS  8.  Patient will demonstrate gait speed of >0.8 m/s to be a safe community ambulator with decreased risk for recurrent falls.  Baseline: 0.46 m/s Goal status: IN  PROGRESS    PLAN:  PT FREQUENCY: 1-2x/week  PT DURATION: 8 weeks  PLANNED INTERVENTIONS: Therapeutic exercises, Therapeutic activity, Neuromuscular re-education, Balance training, Gait training, Patient/Family education, Self Care, Joint mobilization, Stair training, Dry Needling, Spinal mobilization, Cryotherapy, Moist heat, Manual therapy, and Re-evaluation.  PLAN FOR NEXT SESSION: continue exercises for hip strengthening and core strengthening as tolerated.  Needs both back, core and LE for back pain and balance.    Mikita Lesmeister L Evelyn Moch, PT, DPT OCS 10/27/2022, 3:52 PM

## 2022-11-03 ENCOUNTER — Ambulatory Visit: Payer: Medicare PPO

## 2022-11-05 ENCOUNTER — Ambulatory Visit: Payer: Medicare PPO

## 2022-11-05 ENCOUNTER — Other Ambulatory Visit: Payer: Self-pay

## 2022-11-05 DIAGNOSIS — M6281 Muscle weakness (generalized): Secondary | ICD-10-CM | POA: Diagnosis not present

## 2022-11-05 DIAGNOSIS — R2689 Other abnormalities of gait and mobility: Secondary | ICD-10-CM | POA: Diagnosis not present

## 2022-11-05 DIAGNOSIS — M5459 Other low back pain: Secondary | ICD-10-CM

## 2022-11-05 NOTE — Therapy (Addendum)
OUTPATIENT PHYSICAL THERAPY TREATMENT/Discharge summary   Patient Name: Linda [name] MRN: [id number] DOB:1950/07/13, 72 y.o., female Today's Date: 11/05/2022  END OF SESSION:  PT End of Session - 11/05/22 1640     Visit Number 9    Date for PT Re-Evaluation 11/17/22    Authorization Type [employer] MCR    Authorization Time Period 09/22/22- 11/17/22    Authorization - Number of Visits 16    Progress Note Due on Visit 10    PT Start Time 1318    PT Stop Time 1400    PT Time Calculation (min) 42 min    Activity Tolerance Patient tolerated treatment well    Behavior During Therapy [facility] for tasks assessed/performed                   Past Medical History:  Diagnosis Date   Allergic rhinitis    Arthritis    Breast cancer (HCC) 2002   left   Diabetes (HCC)    Fibromyalgia    Glaucoma    Goals of care, counseling/discussion 12/18/2019   Hypercholesteremia    Hyperlipidemia    Personal history of chemotherapy 05/2000   left breast   Personal history of radiation therapy 2002   Letf breast   Stage II infiltrating ductal breast carcinoma, ER-, left (HCC) 12/18/2019   Past Surgical History:  Procedure Laterality Date   APPENDECTOMY  1989   BREAST BIOPSY Left 04/26/2000   BREAST LUMPECTOMY Left 05/14/2000   left   CHOLECYSTECTOMY  1989   KNEE ARTHROSCOPY Right    NECK SURGERY  06/01/2019   SHOULDER SURGERY Left    VAGINAL HYSTERECTOMY  1987   Patient Active Problem List   Diagnosis Date Noted   Stage II infiltrating ductal breast carcinoma, ER-, left (HCC) 12/18/2019   Goals of care, counseling/discussion 12/18/2019   IDA (iron deficiency anemia) 12/19/2018   Fatigue 12/10/2015   Primary osteoarthritis of both hands 12/10/2015   Low back pain without sciatica 12/10/2015   Type 2 diabetes mellitus  12/10/2015   Depression 12/10/2015   Chronic pansinusitis 09/26/2015   Genetic testing 01/11/2014   Hives 11/11/2012   Chest pain 04/23/2012    Hyperlipidemia    Chronic insomnia 01/29/2011   Perennial allergic rhinitis 01/29/2011   Fibromyalgia 01/29/2011    PCP: Renaye Rakers, MD   REFERRING PROVIDER: Raylene Miyamoto, PA-C   REFERRING DIAG: M51.36 (ICD-10-CM) - DDD (degenerative disc disease), lumbar R26.9 (ICD-10-CM) - Gait abnormality  Rationale for Evaluation and Treatment: Rehabilitation  THERAPY DIAG:  Other abnormalities of gait and mobility  Muscle weakness (generalized)  Other low back pain  ONSET DATE: ongoing chronic, got worse over last few months.   SUBJECTIVE:  SUBJECTIVE STATEMENT: Pt reports she feels good in her lower back now but knees and R hip are painful , more the last few days  PERTINENT HISTORY:  breast CA w/ radiation, chemo, L breast lumpectomy 2002, HLD, fibromyalgia, DM, R knee arthroscopy, C4-6 ACDF 2021, L shoulder surgery, post-polio syndrome, diabetes.    PAIN:  Are you having pain? Yes: NPRS scale: 1/10 Pain location: B knees; 1/10 in low back Pain description: achy, intermittent numbness and tingling down both legs to feet (none currently( Aggravating factors: everything - walking too much, standing too much, lifting, sometimes just too much Relieving factors: pain pill  PRECAUTIONS: Fall and Other: h/o cancer  RED FLAGS: None   WEIGHT BEARING RESTRICTIONS: No  FALLS:  Has patient fallen in last 6 months? Yes. Number of falls 5  LIVING ENVIRONMENT: Lives with: lives with their spouse Lives in: House/apartment Stairs: Yes: External: 1 steps; none Has following equipment at home: Single point cane and Grab bars  OCCUPATION: retired  PLOF: Leisure: reading, occasionally uses floor bike  PATIENT GOALS: stop falling  NEXT MD VISIT: sees every three months  OBJECTIVE:    DIAGNOSTIC FINDINGS:  11/08/2022 MRI Lumbar spine IMPRESSION: L4-5: Facet osteoarthritis with 3 mm of anterolisthesis. Bulging of the disc. Stenosis of the subarticular lateral recesses left worse than right. Neural compression could occur at this level, particularly in the left lateral recess. The facet arthropathy could be a cause of back pain or referred facet syndrome pain. This is new since 2008.  PATIENT SURVEYS:  Modified Oswestry 24/50   COGNITION: Overall cognitive status: Within functional limits for tasks assessed     SENSATION: Light touch: Impaired  slightly diminished L 3 dermatome, otherwise WNL  MUSCLE LENGTH: NT  POSTURE:  leg length discrepancy, LLE shorter, L foot several sizes smaller  PALPATION:   LUMBAR ROM:   AROM eval  Flexion Above knee, pain R Side  Extension 50% limited, pain R side  Right lateral flexion To knee, pain R side  Left lateral flexion To knee, pain R side  Right rotation WNL, pain R side  Left rotation WNL, pain R side   (Blank rows = not tested)  LOWER EXTREMITY ROM:     fusion in L ankle limits DF, 10 deg DF on R, 5 deg on L.   LOWER EXTREMITY MMT:    MMT Right eval Left eval  Hip flexion 4 4-  Hip extension    Hip abduction 5 5  Hip adduction 4+ 4+  Knee flexion 4+ 3  Knee extension 5 4  Ankle dorsiflexion 3 3  Ankle plantarflexion 4* nwb 3* nwb   (Blank rows = not tested)  LUMBAR SPECIAL TESTS:  NT  FUNCTIONAL TESTS:  5 times sit to stand: 25 seconds Berg Balance Scale: 44/56 Dynamic Gait Index: 11/24 MCTSIB: Condition 1: Avg of 3 trials: 30 sec, Condition 2: Avg of 3 trials: 30 sec, Condition 3: Avg of 3 trials: 30 sec, Condition 4: Avg of 3 trials: 30 sec, and Total Score: 120/120    GAIT: Distance walked: 73' Assistive device utilized: Single point cane Level of assistance: Modified independence Comments: visually slow gait speed, decreased heel strike, limited L ankle DF, L foot smaller than R foot.        TODAY'S TREATMENT:  DATE:  11/05/22:therapeutic exercise:  Recumbent cycle level 3, 5 min Supine with 55cm ball under thighs for bridging 15x Supine 55 cm ball under thighs for LTR Supine 55 cm ball under heels, B knee to chest  Supine 55 cm ball under thighs for alt SLR  Standing with red t band around thighs for penguins as well as for/ back rocking to engage hip stability  Provided pt with 1/8" insert L shoe, she had to heel lifts in her shoe, removed one and added the cork to proved a more even full surface lift, to avoid wt shift to fore foot  10/27/22:  Therapeutic exercise:  Nustep level 5, Ue's and LE's 6 min Supine with legs over 55cm ball, for bridging 15 reps Alt SLR with legs over 55 cm ball, therapist holding ball for stability, 15 reps B knee to chest, engaging hip flexors and lower abs, 15 reps  Standing at sink, red t band around thighs, for alt hip abd, small amplitude movement Standing at sink, red t band around thighs for alt for, back rocks, knees straight, in lunge position  Standing on airex for B heel raises, holding sink B hands.   10/15/22 Therapeutic Exercise: to improve strength and mobility.  Demo, verbal and tactile cues throughout for technique. Nustep L4 x 6 min  Mini squats x 8 - stopped d/t audible crepitus in knees Standing back to wall toe raises 2x10  Therapeutic Activity: to improve functional performance. Practiced curb navigation outside with instruction provided for proper step to pattern with cane and safety, SBA given Step ups x 10 6' step   Manual Therapy: to decrease muscle spasm, pain and improve mobility.  PROM for bil knees IASTM and STM to L quads  10/13/22 Therapeutic Exercise: to improve strength and mobility.  Demo, verbal and tactile cues throughout for technique. Nustep L4 x 6 min  Supine  LTR x 10  Supine hip ADD with pillow and TrA 10x5" Hookyling ball squeeze with SAQ 2x5 bil Supine quad stretch x 30 sec bil Manual Therapy: to decrease muscle spasm, pain and improve mobility.  TF joint mobilization grade II for pain bil PROM for bil knees IASTM and STM to bil quads   10/08/22 Therapeutic Exercise: to improve strength and mobility.  Demo, verbal and tactile cues throughout for technique. Nustep L4 x 6 min  Hooklying lower trunk rotation 10x3" each way Bridge with TrA x 10  Hookyling hip ADD with ball and TrA 2 x 10  Supine heel slides x 10 bil Seated hamstring curls RTB x 10 R; L side no resistance Standing toe raise x 10  Retro step x 15 bil Runner stretch x 30 sec bil  GAIT TRAINING:  180 ft with cane--- genu recurvatum on L knee at midstance, decreased arm swing on L, decreased DF  10/01/22 Therapeutic Exercise: to improve strength and mobility.  Demo, verbal and tactile cues throughout for technique. Nustep L3 x 5 min - level dropped d/t flare up Gait 180 ft with SPC Seated 3 way lumbar flexion AROM with green pball x 10  Seated heel slides x 10 bil Seated LAQ x 10 bil Supine LTR x 10   Manual Therapy: to decrease muscle spasm, pain and improve mobility.  STM to bil glutes and piriformis  09/29/22 Therapeutic Exercise: to improve strength and mobility.  Demo, verbal and tactile cues throughout for technique. Nustep L4 x 6 min  Seated ab sets orange pball 2x10 3" pause Seated marching bil  TrA set x 12 Seated LAQ with TrA  x 10  Heel raises x 10  [employer] pews x 10 - mirror used for visual feedback, more weight on R side Retro step x 10 B- more difficult with LLE behind Clock balance 1/2 way around R/L 2x using cane with RLE Supine PPT x 10  Supine LTR x 10 each way Figure 4 piriformis stretch x 30 sec bil Bridges with TrA x 10   09/24/22 Therapeutic Activity:  assessing gait DGI - 11/24 Gait speed 6 m/13.3 seconds = 0.5m/s Therapeutic Exercise: to  improve strength and mobility.  Demo, verbal and tactile cues throughout for technique. Nustep L4 x 5 min  Supine PPT x 10  Supine PPT with march x 10  Supine PPT with pillow squeeze x 10  Supine PPT with bent knee fall outs x 10 each Seated Toe raises  x 10 bil Seated heel raises x 10 bil Seated LAQ x 10 each Seated march/hip abduction x 10 each    09/22/22 Self Care: Findings, POC, safety (already using cane), checked cane height, cues for using cane.  Also provided information on stores that have split shoe programs.    PATIENT EDUCATION:  Education details: HEP update- toe raise and retro step Person educated: Patient Education method: Explanation, Demonstration, Verbal cues, and Handouts Education comprehension: verbalized understanding and returned demonstration  HOME EXERCISE PROGRAM: Access Code: [email or web address] URL: [email or web address] Date: 11/05/2022 Prepared by: Leelah Hanna  Exercises - Supine Lower Trunk Rotation with Swiss Ball  - 1 x daily - 7 x weekly - 3 sets - 10 reps - Supine Hip and Knee Flexion AROM with Swiss Ball  - 1 x daily - 7 x weekly - 3 sets - 10 reps - Supine Bridge with Pelvic Floor Contraction on Swiss Ball  - 1 x daily - 7 x weekly - 3 sets - 10 reps Access Code: [email or web address] URL: [email or web address] Date: 10/08/2022 Prepared by: Verta Ellen  Exercises - Supine Posterior Pelvic Tilt  - 1 x daily - 7 x weekly - 2 sets - 10 reps - Supine Lower Trunk Rotation  - 1 x daily - 7 x weekly - 2 sets - 10 reps - Posterior Pelvic Tilt with Adduction Using Pillow in Hooklying  - 1 x daily - 7 x weekly - 2 sets - 10 reps - Bent Knee Fallouts with Alternating Legs  - 1 x daily - 7 x weekly - 2 sets - 10 reps - Seated Heel Toe Raises  - 1 x daily - 7 x weekly - 2 sets - 10 reps - Seated March  - 1 x daily - 7 x weekly - 2 sets - 10 reps - Seated Long Arc Quad  - 1 x daily - 7 x weekly - 2 sets - 10 reps - Staggered Stance Forward  Backward Weight Shift with Unilateral Counter Support  - 1 x daily - 7 x weekly - 2 sets - 10 reps - Toe Raises with Counter Support  - 1 x daily - 7 x weekly - 3 sets - 10 reps  ASSESSMENT:  CLINICAL IMPRESSION: Pt returned today for skilled physical therapy intervention due to B LE weakness and lower back pain.  Lowe back pain overall improved.  B knees, R hip quite tender, really hyperextends L knee.  Tried again to avoid excessive torque B knees today due to her pain, crepitus noted other sessions.  She had increased pain with recumbend cycle so discontinued after 3  min.  She does have a physioball at home so therefore utilized again today for home instruction. Did note marked hyperextension B knees in standing again, primarily on L,  perhaps due to degenerative changes B knees?  Tolerated today's Rx well .  Linda [name] continues to demonstrate potential for improvement and would benefit from continued skilled therapy to address impairments.      OBJECTIVE IMPAIRMENTS: Abnormal gait, decreased activity tolerance, decreased balance, decreased endurance, decreased mobility, difficulty walking, decreased ROM, decreased strength, increased muscle spasms, impaired flexibility, and pain.   ACTIVITY LIMITATIONS: carrying, lifting, bending, standing, and locomotion level  PARTICIPATION LIMITATIONS: meal prep, cleaning, laundry, shopping, and community activity  PERSONAL FACTORS: Age, Time since onset of injury/illness/exacerbation, and 3+ comorbidities: breast CA w/ radiation, chemo, L breast lumpectomy 2002, HLD, fibromyalgia, DM, R knee arthroscopy, C4-6 ACDF 2021, L shoulder surgery, post-polio syndrome  are also affecting patient's functional outcome.   REHAB POTENTIAL: Good  CLINICAL DECISION MAKING: Evolving/moderate complexity  EVALUATION COMPLEXITY: Moderate   GOALS: Goals reviewed with patient? Yes  SHORT TERM GOALS: Target date: 10/06/2022   Patient will be independent with  initial HEP.  Baseline: needs Goal status: MET 10/01/22   LONG TERM GOALS: Target date: 11/17/2022   Patient will be independent with advanced/ongoing HEP to improve outcomes and carryover.  Baseline:  Goal status: IN PROGRESS  2.  Patient will report 50% improvement in low back pain to improve QOL.  Baseline:  Goal status: MET- 10/08/22 50%  3.  Patient will demonstrate improved functional strength as demonstrated by 5x STS < 20 sec (MCID 5 sec). Baseline: 25 seconds  Goal status: IN PROGRESS  4.  Patient will report at least 6 points improvement on modified Oswestry to demonstrate improved functional ability.  Baseline: 24/50 Goal status: IN PROGRESS   5.  Patient will be able to step up/down curb safely with LRAD for safety with community ambulation.  Baseline: catching toe and falling frequently Goal status: IN PROGRESS- 10/15/22 able to do so carefully  6.  Patient will demonstrate at least 19/24 on DGI to improve gait stability and reduce risk for falls. Baseline: 11/24 Goal status: IN PROGRESS  7.  Patient will score >49/56 on Berg Balance test to demonstrate lower risk of falls.   Baseline: 44/56 Goal status: IN PROGRESS  8.  Patient will demonstrate gait speed of >0.8 m/s to be a safe community ambulator with decreased risk for recurrent falls.  Baseline: 0.46 m/s Goal status: IN PROGRESS    PLAN:  PT FREQUENCY: 1-2x/week  PT DURATION: 8 weeks  PLANNED INTERVENTIONS: Therapeutic exercises, Therapeutic activity, Neuromuscular re-education, Balance training, Gait training, Patient/Family education, Self Care, Joint mobilization, Stair training, Dry Needling, Spinal mobilization, Cryotherapy, Moist heat, Manual therapy, and Re-evaluation.  PLAN FOR NEXT SESSION: continue exercises for hip strengthening and core strengthening as tolerated.  Needs both back, core and LE for back pain and balance.    Kaile Bixler L Lorah Kalina, PT, DPT OCS 11/05/2022, 4:42 PM    PHYSICAL  THERAPY DISCHARGE SUMMARY  Visits from Start of Care: 9   Current functional level related to goals / functional outcomes: Improved LBP   Remaining deficits: Bil knee pain   Education / Equipment: HEP  Plan: Patient agrees to discharge.  Patient goals were not met. Patient is being discharged by request due to family health emergency.    Jena Gauss, PT, DPT 12/18/2022 8:26 AM

## 2022-11-10 ENCOUNTER — Ambulatory Visit: Payer: Medicare PPO

## 2022-11-10 DIAGNOSIS — H25011 Cortical age-related cataract, right eye: Secondary | ICD-10-CM | POA: Diagnosis not present

## 2022-11-10 DIAGNOSIS — H2511 Age-related nuclear cataract, right eye: Secondary | ICD-10-CM | POA: Diagnosis not present

## 2022-11-17 ENCOUNTER — Encounter: Payer: Medicare PPO | Admitting: Physical Therapy

## 2022-11-26 DIAGNOSIS — I10 Essential (primary) hypertension: Secondary | ICD-10-CM | POA: Diagnosis not present

## 2022-11-26 DIAGNOSIS — Z7984 Long term (current) use of oral hypoglycemic drugs: Secondary | ICD-10-CM | POA: Diagnosis not present

## 2022-11-26 DIAGNOSIS — H2521 Age-related cataract, morgagnian type, right eye: Secondary | ICD-10-CM | POA: Diagnosis not present

## 2022-11-26 DIAGNOSIS — E1136 Type 2 diabetes mellitus with diabetic cataract: Secondary | ICD-10-CM | POA: Diagnosis not present

## 2022-11-26 DIAGNOSIS — E7800 Pure hypercholesterolemia, unspecified: Secondary | ICD-10-CM | POA: Diagnosis not present

## 2022-11-26 DIAGNOSIS — Z79899 Other long term (current) drug therapy: Secondary | ICD-10-CM | POA: Diagnosis not present

## 2022-11-26 DIAGNOSIS — H2511 Age-related nuclear cataract, right eye: Secondary | ICD-10-CM | POA: Diagnosis not present

## 2022-11-26 DIAGNOSIS — H25011 Cortical age-related cataract, right eye: Secondary | ICD-10-CM | POA: Diagnosis not present

## 2022-11-26 DIAGNOSIS — H2589 Other age-related cataract: Secondary | ICD-10-CM | POA: Diagnosis not present

## 2023-01-01 NOTE — Progress Notes (Deleted)
Office Visit Note  Patient: Linda [name]             Date of Birth: January 13, 1951           MRN: [id number]             PCP: Renaye Rakers, MD Referring: Renaye Rakers, MD Visit Date: 01/14/2023 Occupation: @GUAROCC @  Subjective:  No chief complaint on file.   History of Present Illness: Linda [name] is a 72 y.o. female ***     Activities of Daily Living:  Patient reports morning stiffness for *** {minute/hour:19697}.   Patient {ACTIONS;DENIES/REPORTS:21021675::"Denies"} nocturnal pain.  Difficulty dressing/grooming: {ACTIONS;DENIES/REPORTS:21021675::"Denies"} Difficulty climbing stairs: {ACTIONS;DENIES/REPORTS:21021675::"Denies"} Difficulty getting out of chair: {ACTIONS;DENIES/REPORTS:21021675::"Denies"} Difficulty using hands for taps, buttons, cutlery, and/or writing: {ACTIONS;DENIES/REPORTS:21021675::"Denies"}  No Rheumatology ROS completed.   PMFS History:  Patient Active Problem List   Diagnosis Date Noted   Stage II infiltrating ductal breast carcinoma, ER-, left (HCC) 12/18/2019   Goals of care, counseling/discussion 12/18/2019   IDA (iron deficiency anemia) 12/19/2018   Fatigue 12/10/2015   Primary osteoarthritis of both hands 12/10/2015   Low back pain without sciatica 12/10/2015   Type 2 diabetes mellitus  12/10/2015   Depression 12/10/2015   Chronic pansinusitis 09/26/2015   Genetic testing 01/11/2014   Hives 11/11/2012   Chest pain 04/23/2012   Hyperlipidemia    Chronic insomnia 01/29/2011   Perennial allergic rhinitis 01/29/2011   Fibromyalgia 01/29/2011    Past Medical History:  Diagnosis Date   Allergic rhinitis    Arthritis    Breast cancer (HCC) 2002   left   Diabetes (HCC)    Fibromyalgia    Glaucoma    Goals of care, counseling/discussion 12/18/2019   Hypercholesteremia    Hyperlipidemia    Personal history of chemotherapy 05/2000   left breast   Personal history of radiation therapy 2002   Letf breast   Stage II  infiltrating ductal breast carcinoma, ER-, left (HCC) 12/18/2019    Family History  Problem Relation Age of Onset   Throat cancer Father    Breast cancer Maternal Aunt    CAD Mother    Stroke Mother    Breast cancer Daughter    Past Surgical History:  Procedure Laterality Date   APPENDECTOMY  1989   BREAST BIOPSY Left 04/26/2000   BREAST LUMPECTOMY Left 05/14/2000   left   CHOLECYSTECTOMY  1989   KNEE ARTHROSCOPY Right    NECK SURGERY  06/01/2019   SHOULDER SURGERY Left    VAGINAL HYSTERECTOMY  1987   Social History   Social History Narrative   Not on file   Immunization History  Administered Date(s) Administered   Influenza, High Dose Seasonal PF 10/28/2017   Influenza,inj,Quad PF,6+ Mos 12/17/2014   Influenza-Unspecified 11/12/2015   PFIZER(Purple Top)SARS-COV-2 Vaccination 02/18/2019, 03/11/2019   Pneumococcal Conjugate-13 07/20/2016, 10/28/2017     Objective: Vital Signs: There were no vitals taken for this visit.   Physical Exam   Musculoskeletal Exam: ***  CDAI Exam: CDAI Score: -- Patient Global: --; Provider Global: -- Swollen: --; Tender: -- Joint Exam 01/14/2023   No joint exam has been documented for this visit   There is currently no information documented on the homunculus. Go to the Rheumatology activity and complete the homunculus joint exam.  Investigation: No additional findings.  Imaging: No results found.  Recent Labs: Lab Results  Component Value Date   WBC 8.0 01/12/2022   HGB 11.9 (L) 01/12/2022   PLT 300 01/12/2022  NA 140 01/12/2022   K 4.5 01/12/2022   CL 102 01/12/2022   CO2 32 01/12/2022   GLUCOSE 95 01/12/2022   BUN 19 01/12/2022   CREATININE 0.85 01/12/2022   BILITOT 0.3 01/12/2022   ALKPHOS 58 01/12/2022   AST 19 01/12/2022   ALT 19 01/12/2022   PROT 7.7 01/12/2022   ALBUMIN 4.6 01/12/2022   CALCIUM 10.1 01/12/2022   GFRAA >60 12/16/2018    Speciality Comments: No specialty comments  available.  Procedures:  No procedures performed Allergies: Sulfa antibiotics   Assessment / Plan:     Visit Diagnoses: No diagnosis found.  Orders: No orders of the defined types were placed in this encounter.  No orders of the defined types were placed in this encounter.   Face-to-face time spent with patient was *** minutes. Greater than 50% of time was spent in counseling and coordination of care.  Follow-Up Instructions: No follow-ups on file.   Ellen Henri, CMA  Note - This record has been created using Animal nutritionist.  Chart creation errors have been sought, but may not always  have been located. Such creation errors do not reflect on  the standard of medical care.

## 2023-01-08 DIAGNOSIS — F1120 Opioid dependence, uncomplicated: Secondary | ICD-10-CM | POA: Diagnosis not present

## 2023-01-08 DIAGNOSIS — M25561 Pain in right knee: Secondary | ICD-10-CM | POA: Diagnosis not present

## 2023-01-08 DIAGNOSIS — M51362 Other intervertebral disc degeneration, lumbar region with discogenic back pain and lower extremity pain: Secondary | ICD-10-CM | POA: Diagnosis not present

## 2023-01-08 DIAGNOSIS — M542 Cervicalgia: Secondary | ICD-10-CM | POA: Diagnosis not present

## 2023-01-13 ENCOUNTER — Encounter: Payer: Self-pay | Admitting: Family

## 2023-01-13 ENCOUNTER — Inpatient Hospital Stay: Payer: Medicare PPO | Attending: Hematology & Oncology

## 2023-01-13 ENCOUNTER — Inpatient Hospital Stay: Payer: Medicare PPO | Admitting: Family

## 2023-01-13 ENCOUNTER — Other Ambulatory Visit: Payer: Self-pay | Admitting: *Deleted

## 2023-01-13 VITALS — BP 135/63 | HR 97 | Temp 98.3°F | Resp 18 | Ht 62.0 in | Wt 140.0 lb

## 2023-01-13 DIAGNOSIS — C50912 Malignant neoplasm of unspecified site of left female breast: Secondary | ICD-10-CM

## 2023-01-13 DIAGNOSIS — D509 Iron deficiency anemia, unspecified: Secondary | ICD-10-CM | POA: Diagnosis not present

## 2023-01-13 DIAGNOSIS — Z171 Estrogen receptor negative status [ER-]: Secondary | ICD-10-CM

## 2023-01-13 DIAGNOSIS — Z9221 Personal history of antineoplastic chemotherapy: Secondary | ICD-10-CM | POA: Diagnosis not present

## 2023-01-13 DIAGNOSIS — Z853 Personal history of malignant neoplasm of breast: Secondary | ICD-10-CM | POA: Diagnosis not present

## 2023-01-13 DIAGNOSIS — D500 Iron deficiency anemia secondary to blood loss (chronic): Secondary | ICD-10-CM

## 2023-01-13 LAB — CBC WITH DIFFERENTIAL (CANCER CENTER ONLY)
Abs Immature Granulocytes: 0.03 10*3/uL (ref 0.00–0.07)
Basophils Absolute: 0 10*3/uL (ref 0.0–0.1)
Basophils Relative: 0 %
Eosinophils Absolute: 0.1 10*3/uL (ref 0.0–0.5)
Eosinophils Relative: 1 %
HCT: 36.4 % (ref 36.0–46.0)
Hemoglobin: 11.7 g/dL — ABNORMAL LOW (ref 12.0–15.0)
Immature Granulocytes: 0 %
Lymphocytes Relative: 40 %
Lymphs Abs: 4.4 10*3/uL — ABNORMAL HIGH (ref 0.7–4.0)
MCH: 27.3 pg (ref 26.0–34.0)
MCHC: 32.1 g/dL (ref 30.0–36.0)
MCV: 85 fL (ref 80.0–100.0)
Monocytes Absolute: 0.6 10*3/uL (ref 0.1–1.0)
Monocytes Relative: 5 %
Neutro Abs: 5.7 10*3/uL (ref 1.7–7.7)
Neutrophils Relative %: 54 %
Platelet Count: 279 10*3/uL (ref 150–400)
RBC: 4.28 MIL/uL (ref 3.87–5.11)
RDW: 14.5 % (ref 11.5–15.5)
WBC Count: 10.8 10*3/uL — ABNORMAL HIGH (ref 4.0–10.5)
nRBC: 0 % (ref 0.0–0.2)

## 2023-01-13 LAB — FERRITIN: Ferritin: 17 ng/mL (ref 11–307)

## 2023-01-13 LAB — CMP (CANCER CENTER ONLY)
ALT: 15 U/L (ref 0–44)
AST: 17 U/L (ref 15–41)
Albumin: 4.3 g/dL (ref 3.5–5.0)
Alkaline Phosphatase: 53 U/L (ref 38–126)
Anion gap: 8 (ref 5–15)
BUN: 24 mg/dL — ABNORMAL HIGH (ref 8–23)
CO2: 29 mmol/L (ref 22–32)
Calcium: 9.7 mg/dL (ref 8.9–10.3)
Chloride: 105 mmol/L (ref 98–111)
Creatinine: 0.76 mg/dL (ref 0.44–1.00)
GFR, Estimated: >60 mL/min (ref >60)
Glucose, Bld: 115 mg/dL — ABNORMAL HIGH (ref 70–99)
Potassium: 4.5 mmol/L (ref 3.5–5.1)
Sodium: 142 mmol/L (ref 135–145)
Total Bilirubin: 0.2 mg/dL (ref <1.2)
Total Protein: 7.1 g/dL (ref 6.5–8.1)

## 2023-01-13 LAB — LACTATE DEHYDROGENASE: LDH: 180 U/L (ref 98–192)

## 2023-01-13 NOTE — Progress Notes (Signed)
Hematology and Oncology Follow Up Visit  Linda [name] [id number] 1950/04/29 72 y.o. 01/13/2023   Principle Diagnosis:  Stage IIA (T2 N0 M0) ductal carcinoma of the left breast triple-negative Iron deficiency anemia   Current Therapy:        IV iron as indicated   Interim History:  Linda [name] is here today for follow-up. She is doing quite well. She is under some stress at home caring her her husband with dementia.  She has not noted any changes with her breast. Bilateral exam negative. No mass, lesion or rash noted.  No adenopathy or lymphedema. She is due again for annual mammogram at the end of January 2025.  No fever, chills, n/v, cough, rash, dizziness, SOB, chest pain, palpitations, abdominal pain or changes in bowel or bladder habits at this time.  She notes occasional SOB with over exertion.  No swelling or tenderness in her extremities. Tingling in her hands comes and goes.  She drinks a Boost with added fiber daily.  Appetite and hydration are good. Weight is stable at 140 lbs.   ECOG Performance Status: 0 - Asymptomatic  Medications:  Allergies as of 01/13/2023       Reactions   Sulfa Antibiotics Rash, Hives        Medication List        Accurate as of January 13, 2023  2:02 PM. If you have any questions, ask your nurse or doctor.          buPROPion 300 MG 24 hr tablet Commonly known as: WELLBUTRIN XL Take 300 mg by mouth daily.   cyclobenzaprine 10 MG tablet Commonly known as: FLEXERIL Take 1 tablet by mouth 3 (three) times daily as needed.   cycloSPORINE 0.05 % ophthalmic emulsion Commonly known as: RESTASIS Place 1 drop into both eyes 2 (two) times daily.   diclofenac Sodium 1 % Gel Commonly known as: VOLTAREN APPLY 2-4 GRAMS TO AFFECTED JOINT 4 TIMES DAILY AS NEEDED.   gabapentin 300 MG capsule Commonly known as: NEURONTIN Take 300 mg by mouth at bedtime.   HYDROcodone-acetaminophen 5-325 MG tablet Commonly known as:  NORCO/VICODIN Take 1 tablet by mouth every 8 (eight) hours as needed. for pain   Janumet XR [phone] MG Tb24 Generic drug: SitaGLIPtin-MetFORMIN HCl Take 1 tablet by mouth daily.   lidocaine 5 % Commonly known as: LIDODERM Place 1 patch onto the skin daily. Remove & Discard patch within 12 hours or as directed by MD   Lumigan 0.01 % Soln Generic drug: bimatoprost 1 DROP INTO BOTH EYES IN THE EVENING   rosuvastatin 10 MG tablet Commonly known as: CRESTOR Take 10 mg by mouth daily.   traMADol 50 MG tablet Commonly known as: ULTRAM Take 50-100 mg by mouth every 6 (six) hours as needed.   Vitamin D (Ergocalciferol) 1.25 MG (50000 UNIT) Caps capsule Commonly known as: DRISDOL TAKE 1 CAPSULE BY MOUTH ONE TIME PER WEEK        Allergies:  Allergies  Allergen Reactions   Sulfa Antibiotics Rash and Hives    Past Medical History, Surgical history, Social history, and Family History were reviewed and updated.  Review of Systems: All other 10 point review of systems is negative.   Physical Exam:  vitals were not taken for this visit.   Wt Readings from Last 3 Encounters:  07/09/22 140 lb 3.2 oz (63.6 kg)  01/12/22 138 lb (62.6 kg)  01/08/22 141 lb (64 kg)    Ocular: Sclerae unicteric, pupils equal,  round and reactive to light Ear-nose-throat: Oropharynx clear, dentition fair Lymphatic: No cervical, supraclavicular or axcillary adenopathy Lungs no rales or rhonchi, good excursion bilaterally Heart regular rate and rhythm, no murmur appreciated Abd soft, nontender, positive bowel sounds MSK no focal spinal tenderness, no joint edema Neuro: non-focal, well-oriented, appropriate affect Breasts: Same as above   Lab Results  Component Value Date   WBC 10.8 (H) 01/13/2023   HGB 11.7 (L) 01/13/2023   HCT 36.4 01/13/2023   MCV 85.0 01/13/2023   PLT 279 01/13/2023   Lab Results  Component Value Date   FERRITIN 45 01/12/2022   IRON 72 01/12/2022   TIBC 368 01/12/2022    UIBC 296 01/12/2022   IRONPCTSAT 20 01/12/2022   Lab Results  Component Value Date   RETICCTPCT 1.3 04/23/2012   RBC 4.28 01/13/2023   No results found for: "KPAFRELGTCHN", "LAMBDASER", "KAPLAMBRATIO" No results found for: "IGGSERUM", "IGA", "IGMSERUM" No results found for: "TOTALPROTELP", "ALBUMINELP", "A1GS", "A2GS", "BETS", "BETA2SER", "GAMS", "MSPIKE", "SPEI"   Chemistry      Component Value Date/Time   NA 140 01/12/2022 1222   NA 139 12/11/2016 1252   NA 138 12/13/2015 1352   K 4.5 01/12/2022 1222   K 3.9 12/11/2016 1252   K 4.1 12/13/2015 1352   CL 102 01/12/2022 1222   CL 104 12/11/2016 1252   CO2 32 01/12/2022 1222   CO2 28 12/11/2016 1252   CO2 23 12/13/2015 1352   BUN 19 01/12/2022 1222   BUN 14 12/11/2016 1252   BUN 17.2 12/13/2015 1352   CREATININE 0.85 01/12/2022 1222   CREATININE 0.72 12/11/2016 1252   CREATININE 0.8 12/13/2015 1352      Component Value Date/Time   CALCIUM 10.1 01/12/2022 1222   CALCIUM 9.6 12/11/2016 1252   CALCIUM 9.7 12/13/2015 1352   ALKPHOS 58 01/12/2022 1222   ALKPHOS 80 12/11/2016 1252   ALKPHOS 79 12/13/2015 1352   AST 19 01/12/2022 1222   AST 14 12/13/2015 1352   ALT 19 01/12/2022 1222   ALT 13 12/13/2015 1352   BILITOT 0.3 01/12/2022 1222   BILITOT <0.22 12/13/2015 1352       Impression and Plan: Linda [name] is a very pleasant 72 yo African American female with history of stage IIa (T2N0M0) ductal carcinoma of the left breast, triple negative. She was diagnosed and treated over 18 yrs ago with lumpectomy and chemotherapy (Adriamycin/Cytoxan).  Mammogram due again in January 2025. Order is in. Patient states that she will call them to schedule.  Iron studies pending. We will replace if needed.  Follow-up in 1 year.   Linda Stanford, NP 12/18/20242:02 PM

## 2023-01-14 ENCOUNTER — Ambulatory Visit: Payer: Medicare PPO | Admitting: Rheumatology

## 2023-01-14 DIAGNOSIS — Z8659 Personal history of other mental and behavioral disorders: Secondary | ICD-10-CM

## 2023-01-14 DIAGNOSIS — M810 Age-related osteoporosis without current pathological fracture: Secondary | ICD-10-CM

## 2023-01-14 DIAGNOSIS — M797 Fibromyalgia: Secondary | ICD-10-CM

## 2023-01-14 DIAGNOSIS — Z981 Arthrodesis status: Secondary | ICD-10-CM

## 2023-01-14 DIAGNOSIS — M47816 Spondylosis without myelopathy or radiculopathy, lumbar region: Secondary | ICD-10-CM

## 2023-01-14 DIAGNOSIS — R5383 Other fatigue: Secondary | ICD-10-CM

## 2023-01-14 DIAGNOSIS — M7061 Trochanteric bursitis, right hip: Secondary | ICD-10-CM

## 2023-01-14 DIAGNOSIS — M62838 Other muscle spasm: Secondary | ICD-10-CM

## 2023-01-14 DIAGNOSIS — F5104 Psychophysiologic insomnia: Secondary | ICD-10-CM

## 2023-01-14 DIAGNOSIS — M19041 Primary osteoarthritis, right hand: Secondary | ICD-10-CM

## 2023-01-14 DIAGNOSIS — M65341 Trigger finger, right ring finger: Secondary | ICD-10-CM

## 2023-01-14 DIAGNOSIS — Z8639 Personal history of other endocrine, nutritional and metabolic disease: Secondary | ICD-10-CM

## 2023-01-14 DIAGNOSIS — M170 Bilateral primary osteoarthritis of knee: Secondary | ICD-10-CM

## 2023-01-14 LAB — IRON AND IRON BINDING CAPACITY (CC-WL,HP ONLY)
Iron: 53 ug/dL (ref 28–170)
Saturation Ratios: 14 % (ref 10.4–31.8)
TIBC: 386 ug/dL (ref 250–450)
UIBC: 333 ug/dL (ref 148–442)

## 2023-01-18 NOTE — Progress Notes (Deleted)
 Office Visit Note  Patient: Linda [name]             Date of Birth: 1951-01-25           MRN: [id number]             PCP: Renaye Rakers, MD Referring: Renaye Rakers, MD Visit Date: 02/01/2023 Occupation: @GUAROCC @  Subjective:  No chief complaint on file.   History of Present Illness: Jaynie Hawksworth is a 72 y.o. female ***     Activities of Daily Living:  Patient reports morning stiffness for *** {minute/hour:19697}.   Patient {ACTIONS;DENIES/REPORTS:21021675::"Denies"} nocturnal pain.  Difficulty dressing/grooming: {ACTIONS;DENIES/REPORTS:21021675::"Denies"} Difficulty climbing stairs: {ACTIONS;DENIES/REPORTS:21021675::"Denies"} Difficulty getting out of chair: {ACTIONS;DENIES/REPORTS:21021675::"Denies"} Difficulty using hands for taps, buttons, cutlery, and/or writing: {ACTIONS;DENIES/REPORTS:21021675::"Denies"}  No Rheumatology ROS completed.   PMFS History:  Patient Active Problem List   Diagnosis Date Noted  . Stage II infiltrating ductal breast carcinoma, ER-, left (HCC) 12/18/2019  . Goals of care, counseling/discussion 12/18/2019  . IDA (iron deficiency anemia) 12/19/2018  . Fatigue 12/10/2015  . Primary osteoarthritis of both hands 12/10/2015  . Low back pain without sciatica 12/10/2015  . Type 2 diabetes mellitus  12/10/2015  . Depression 12/10/2015  . Chronic pansinusitis 09/26/2015  . Genetic testing 01/11/2014  . Hives 11/11/2012  . Chest pain 04/23/2012  . Hyperlipidemia   . Chronic insomnia 01/29/2011  . Perennial allergic rhinitis 01/29/2011  . Fibromyalgia 01/29/2011    Past Medical History:  Diagnosis Date  . Allergic rhinitis   . Arthritis   . Breast cancer (HCC) 2002   left  . Diabetes (HCC)   . Fibromyalgia   . Glaucoma   . Goals of care, counseling/discussion 12/18/2019  . Hypercholesteremia   . Hyperlipidemia   . Personal history of chemotherapy 05/2000   left breast  . Personal history of radiation therapy 2002   Letf  breast  . Stage II infiltrating ductal breast carcinoma, ER-, left (HCC) 12/18/2019    Family History  Problem Relation Age of Onset  . Throat cancer Father   . Breast cancer Maternal Aunt   . CAD Mother   . Stroke Mother   . Breast cancer Daughter    Past Surgical History:  Procedure Laterality Date  . APPENDECTOMY  1989  . BREAST BIOPSY Left 04/26/2000  . BREAST LUMPECTOMY Left 05/14/2000   left  . CHOLECYSTECTOMY  1989  . KNEE ARTHROSCOPY Right   . NECK SURGERY  06/01/2019  . SHOULDER SURGERY Left   . VAGINAL HYSTERECTOMY  1987   Social History   Social History Narrative  . Not on file   Immunization History  Administered Date(s) Administered  . Influenza, High Dose Seasonal PF 10/28/2017  . Influenza,inj,Quad PF,6+ Mos 12/17/2014  . Influenza-Unspecified 11/12/2015  . PFIZER(Purple Top)SARS-COV-2 Vaccination 02/18/2019, 03/11/2019  . Pneumococcal Conjugate-13 07/20/2016, 10/28/2017     Objective: Vital Signs: There were no vitals taken for this visit.   Physical Exam   Musculoskeletal Exam: ***  CDAI Exam: CDAI Score: -- Patient Global: --; Provider Global: -- Swollen: --; Tender: -- Joint Exam 02/01/2023   No joint exam has been documented for this visit   There is currently no information documented on the homunculus. Go to the Rheumatology activity and complete the homunculus joint exam.  Investigation: No additional findings.  Imaging: No results found.  Recent Labs: Lab Results  Component Value Date   WBC 10.8 (H) 01/13/2023   HGB 11.7 (L) 01/13/2023   PLT 279 01/13/2023  NA 142 01/13/2023   K 4.5 01/13/2023   CL 105 01/13/2023   CO2 29 01/13/2023   GLUCOSE 115 (H) 01/13/2023   BUN 24 (H) 01/13/2023   CREATININE 0.76 01/13/2023   BILITOT 0.2 01/13/2023   ALKPHOS 53 01/13/2023   AST 17 01/13/2023   ALT 15 01/13/2023   PROT 7.1 01/13/2023   ALBUMIN 4.3 01/13/2023   CALCIUM 9.7 01/13/2023   GFRAA >60 12/16/2018    Speciality  Comments: No specialty comments available.  Procedures:  No procedures performed Allergies: Sulfa antibiotics   Assessment / Plan:     Visit Diagnoses: Primary osteoarthritis of both hands  Trigger finger, right ring finger  Greater trochanteric bursitis of both hips  Primary osteoarthritis of both knees  Trapezius muscle spasm  S/P cervical spinal fusion  Degeneration of intervertebral disc of lumbar region without discogenic back pain or lower extremity pain  Fibromyalgia  Other fatigue  Chronic insomnia  Osteoporosis, post-menopausal  History of diabetes mellitus  History of depression  History of hyperlipidemia  Orders: No orders of the defined types were placed in this encounter.  No orders of the defined types were placed in this encounter.   Face-to-face time spent with patient was *** minutes. Greater than 50% of time was spent in counseling and coordination of care.  Follow-Up Instructions: No follow-ups on file.   Gearldine Bienenstock, PA-C  Note - This record has been created using Dragon software.  Chart creation errors have been sought, but may not always  have been located. Such creation errors do not reflect on  the standard of medical care.

## 2023-02-01 ENCOUNTER — Ambulatory Visit: Payer: Medicare PPO | Admitting: Rheumatology

## 2023-02-01 DIAGNOSIS — M51369 Other intervertebral disc degeneration, lumbar region without mention of lumbar back pain or lower extremity pain: Secondary | ICD-10-CM

## 2023-02-01 DIAGNOSIS — M65341 Trigger finger, right ring finger: Secondary | ICD-10-CM

## 2023-02-01 DIAGNOSIS — Z8639 Personal history of other endocrine, nutritional and metabolic disease: Secondary | ICD-10-CM

## 2023-02-01 DIAGNOSIS — M62838 Other muscle spasm: Secondary | ICD-10-CM

## 2023-02-01 DIAGNOSIS — M797 Fibromyalgia: Secondary | ICD-10-CM

## 2023-02-01 DIAGNOSIS — Z981 Arthrodesis status: Secondary | ICD-10-CM

## 2023-02-01 DIAGNOSIS — Z8659 Personal history of other mental and behavioral disorders: Secondary | ICD-10-CM

## 2023-02-01 DIAGNOSIS — M19042 Primary osteoarthritis, left hand: Secondary | ICD-10-CM

## 2023-02-01 DIAGNOSIS — M170 Bilateral primary osteoarthritis of knee: Secondary | ICD-10-CM

## 2023-02-01 DIAGNOSIS — M7061 Trochanteric bursitis, right hip: Secondary | ICD-10-CM

## 2023-02-01 DIAGNOSIS — F5104 Psychophysiologic insomnia: Secondary | ICD-10-CM

## 2023-02-01 DIAGNOSIS — R5383 Other fatigue: Secondary | ICD-10-CM

## 2023-02-01 DIAGNOSIS — M810 Age-related osteoporosis without current pathological fracture: Secondary | ICD-10-CM

## 2023-02-11 DIAGNOSIS — M25561 Pain in right knee: Secondary | ICD-10-CM | POA: Diagnosis not present

## 2023-02-11 DIAGNOSIS — M25562 Pain in left knee: Secondary | ICD-10-CM | POA: Diagnosis not present

## 2023-02-17 DIAGNOSIS — M130 Polyarthritis, unspecified: Secondary | ICD-10-CM | POA: Diagnosis not present

## 2023-02-17 DIAGNOSIS — E1169 Type 2 diabetes mellitus with other specified complication: Secondary | ICD-10-CM | POA: Diagnosis not present

## 2023-02-17 DIAGNOSIS — R251 Tremor, unspecified: Secondary | ICD-10-CM | POA: Diagnosis not present

## 2023-02-17 DIAGNOSIS — R2689 Other abnormalities of gait and mobility: Secondary | ICD-10-CM | POA: Diagnosis not present

## 2023-02-17 DIAGNOSIS — I10 Essential (primary) hypertension: Secondary | ICD-10-CM | POA: Diagnosis not present

## 2023-02-17 DIAGNOSIS — Z6826 Body mass index (BMI) 26.0-26.9, adult: Secondary | ICD-10-CM | POA: Diagnosis not present

## 2023-02-17 DIAGNOSIS — B91 Sequelae of poliomyelitis: Secondary | ICD-10-CM | POA: Diagnosis not present

## 2023-03-05 ENCOUNTER — Ambulatory Visit
Admission: RE | Admit: 2023-03-05 | Discharge: 2023-03-05 | Disposition: A | Discharge status: Home / Self Care | Payer: Medicare PPO | Source: Ambulatory Visit | Attending: Family

## 2023-03-05 DIAGNOSIS — Z1231 Encounter for screening mammogram for malignant neoplasm of breast: Secondary | ICD-10-CM | POA: Diagnosis not present

## 2023-03-05 DIAGNOSIS — C50912 Malignant neoplasm of unspecified site of left female breast: Secondary | ICD-10-CM

## 2023-03-15 DIAGNOSIS — R251 Tremor, unspecified: Secondary | ICD-10-CM | POA: Diagnosis not present

## 2023-03-23 DIAGNOSIS — H9113 Presbycusis, bilateral: Secondary | ICD-10-CM | POA: Diagnosis not present

## 2023-03-23 DIAGNOSIS — H9313 Tinnitus, bilateral: Secondary | ICD-10-CM | POA: Diagnosis not present

## 2023-03-23 DIAGNOSIS — H903 Sensorineural hearing loss, bilateral: Secondary | ICD-10-CM | POA: Diagnosis not present

## 2023-03-24 DIAGNOSIS — R5383 Other fatigue: Secondary | ICD-10-CM | POA: Diagnosis not present

## 2023-03-24 DIAGNOSIS — Z6826 Body mass index (BMI) 26.0-26.9, adult: Secondary | ICD-10-CM | POA: Diagnosis not present

## 2023-03-24 DIAGNOSIS — I10 Essential (primary) hypertension: Secondary | ICD-10-CM | POA: Diagnosis not present

## 2023-03-24 DIAGNOSIS — R251 Tremor, unspecified: Secondary | ICD-10-CM | POA: Diagnosis not present

## 2023-03-24 DIAGNOSIS — E1169 Type 2 diabetes mellitus with other specified complication: Secondary | ICD-10-CM | POA: Diagnosis not present

## 2023-03-24 DIAGNOSIS — M797 Fibromyalgia: Secondary | ICD-10-CM | POA: Diagnosis not present

## 2023-04-26 DIAGNOSIS — M797 Fibromyalgia: Secondary | ICD-10-CM | POA: Diagnosis not present

## 2023-04-26 DIAGNOSIS — E1169 Type 2 diabetes mellitus with other specified complication: Secondary | ICD-10-CM | POA: Diagnosis not present

## 2023-04-26 DIAGNOSIS — R251 Tremor, unspecified: Secondary | ICD-10-CM | POA: Diagnosis not present

## 2023-04-26 DIAGNOSIS — I10 Essential (primary) hypertension: Secondary | ICD-10-CM | POA: Diagnosis not present

## 2023-04-26 DIAGNOSIS — F064 Anxiety disorder due to known physiological condition: Secondary | ICD-10-CM | POA: Diagnosis not present

## 2023-07-22 DIAGNOSIS — M51362 Other intervertebral disc degeneration, lumbar region with discogenic back pain and lower extremity pain: Secondary | ICD-10-CM | POA: Diagnosis not present

## 2023-07-22 DIAGNOSIS — F1120 Opioid dependence, uncomplicated: Secondary | ICD-10-CM | POA: Diagnosis not present

## 2023-10-27 DIAGNOSIS — Z23 Encounter for immunization: Secondary | ICD-10-CM | POA: Diagnosis not present

## 2023-11-26 ENCOUNTER — Encounter: Payer: Self-pay | Admitting: *Deleted

## 2023-11-26 NOTE — Progress Notes (Signed)
 Linda [name]                                          MRN: [id number]   11/26/2023   The [facility] Quality Team Specialist reviewed this patient medical record for the purposes of chart review for care gap closure. The following were reviewed: chart review for care gap closure-controlling blood pressure.    [facility] Quality Team

## 2024-01-12 ENCOUNTER — Inpatient Hospital Stay: Payer: Medicare PPO | Attending: Hematology & Oncology

## 2024-01-12 ENCOUNTER — Encounter: Payer: Self-pay | Admitting: Family

## 2024-01-12 ENCOUNTER — Ambulatory Visit: Payer: Medicare PPO | Admitting: Family

## 2024-01-12 VITALS — BP 143/74 | HR 97 | Temp 98.2°F | Resp 17 | Wt 133.0 lb

## 2024-01-12 DIAGNOSIS — D500 Iron deficiency anemia secondary to blood loss (chronic): Secondary | ICD-10-CM

## 2024-01-12 DIAGNOSIS — Z853 Personal history of malignant neoplasm of breast: Secondary | ICD-10-CM | POA: Insufficient documentation

## 2024-01-12 DIAGNOSIS — Z9221 Personal history of antineoplastic chemotherapy: Secondary | ICD-10-CM | POA: Diagnosis not present

## 2024-01-12 DIAGNOSIS — C50912 Malignant neoplasm of unspecified site of left female breast: Secondary | ICD-10-CM

## 2024-01-12 DIAGNOSIS — D509 Iron deficiency anemia, unspecified: Secondary | ICD-10-CM | POA: Insufficient documentation

## 2024-01-12 DIAGNOSIS — Z171 Estrogen receptor negative status [ER-]: Secondary | ICD-10-CM

## 2024-01-12 LAB — CMP (CANCER CENTER ONLY)
ALT: 20 U/L (ref 0–44)
AST: 28 U/L (ref 15–41)
Albumin: 4.6 g/dL (ref 3.5–5.0)
Alkaline Phosphatase: 61 U/L (ref 38–126)
Anion gap: 12 (ref 5–15)
BUN: 19 mg/dL (ref 8–23)
CO2: 27 mmol/L (ref 22–32)
Calcium: 10 mg/dL (ref 8.9–10.3)
Chloride: 102 mmol/L (ref 98–111)
Creatinine: 0.67 mg/dL (ref 0.44–1.00)
GFR, Estimated: >60 mL/min (ref >60)
Glucose, Bld: 150 mg/dL — ABNORMAL HIGH (ref 70–99)
Potassium: 4 mmol/L (ref 3.5–5.1)
Sodium: 141 mmol/L (ref 135–145)
Total Bilirubin: 0.3 mg/dL (ref 0.0–1.2)
Total Protein: 7.5 g/dL (ref 6.5–8.1)

## 2024-01-12 LAB — CBC WITH DIFFERENTIAL (CANCER CENTER ONLY)
Abs Immature Granulocytes: 0.02 K/uL (ref 0.00–0.07)
Basophils Absolute: 0 K/uL (ref 0.0–0.1)
Basophils Relative: 1 %
Eosinophils Absolute: 0.1 K/uL (ref 0.0–0.5)
Eosinophils Relative: 1 %
HCT: 33.4 % — ABNORMAL LOW (ref 36.0–46.0)
Hemoglobin: 10.8 g/dL — ABNORMAL LOW (ref 12.0–15.0)
Immature Granulocytes: 0 %
Lymphocytes Relative: 37 %
Lymphs Abs: 3.2 K/uL (ref 0.7–4.0)
MCH: 27.5 pg (ref 26.0–34.0)
MCHC: 32.3 g/dL (ref 30.0–36.0)
MCV: 85 fL (ref 80.0–100.0)
Monocytes Absolute: 0.5 K/uL (ref 0.1–1.0)
Monocytes Relative: 5 %
Neutro Abs: 4.9 K/uL (ref 1.7–7.7)
Neutrophils Relative %: 56 %
Platelet Count: 294 K/uL (ref 150–400)
RBC: 3.93 MIL/uL (ref 3.87–5.11)
RDW: 15.1 % (ref 11.5–15.5)
WBC Count: 8.7 K/uL (ref 4.0–10.5)
nRBC: 0 % (ref 0.0–0.2)

## 2024-01-12 LAB — IRON AND IRON BINDING CAPACITY (CC-WL,HP ONLY)
Iron: 82 ug/dL (ref 28–170)
Saturation Ratios: 21 % (ref 10.4–31.8)
TIBC: 392 ug/dL (ref 250–450)
UIBC: 311 ug/dL

## 2024-01-12 LAB — LACTATE DEHYDROGENASE: LDH: 209 U/L (ref 105–235)

## 2024-01-12 LAB — FERRITIN: Ferritin: 48 ng/mL (ref 11–307)

## 2024-01-12 NOTE — Progress Notes (Signed)
 Hematology and Oncology Follow Up Visit  Linda [name] [id number] 1951-01-06 73 y.o. 01/12/2024   Principle Diagnosis:  Stage IIA (T2 N0 M0) ductal carcinoma of the left breast triple-negative Iron  deficiency anemia   Current Therapy:        IV iron  as indicated   Interim History:  Linda [name] is here today for follow-up. She is doing fairly well but does note fatigue  She states that she has bad a stressful year but has a good family support system.  Hgb is 10.8, MCV 85, platelets 294 and WBC count 8.7.  No blood loss noted. Mammogram in February was negative.  No fever, chills, n/v, cough, rash, dizziness, SOB, chest pain, palpitations, abdominal pain or changes in bowel or bladder habits.  No swelling, tenderness, numbness or tingling in her extremities at this time. Appetite and hydration are good. Weight is stable at 133 lbs.    ECOG Performance Status: 1 - Symptomatic but completely ambulatory  Medications:  Allergies as of 01/12/2024       Reactions   Sulfa Antibiotics Rash, Hives        Medication List        Accurate as of January 12, 2024  1:54 PM. If you have any questions, ask your nurse or doctor.          buPROPion  300 MG 24 hr tablet Commonly known as: WELLBUTRIN  XL Take 300 mg by mouth daily.   cyclobenzaprine  10 MG tablet Commonly known as: FLEXERIL  Take 1 tablet by mouth 3 (three) times daily as needed.   cycloSPORINE 0.05 % ophthalmic emulsion Commonly known as: RESTASIS Place 1 drop into both eyes 2 (two) times daily.   diclofenac  Sodium 1 % Gel Commonly known as: VOLTAREN  APPLY 2-4 GRAMS TO AFFECTED JOINT 4 TIMES DAILY AS NEEDED.   HYDROcodone -acetaminophen  5-325 MG tablet Commonly known as: NORCO/VICODIN Take 1 tablet by mouth every 8 (eight) hours as needed. for pain   Janumet XR [phone] MG Tb24 Generic drug: SitaGLIPtin-MetFORMIN HCl Take 1 tablet by mouth daily.   lidocaine  5 % Commonly known as: LIDODERM  Place 1  patch onto the skin daily. Remove & Discard patch within 12 hours or as directed by MD   Lumigan 0.01 % Soln Generic drug: bimatoprost 1 DROP INTO BOTH EYES IN THE EVENING   rosuvastatin 10 MG tablet Commonly known as: CRESTOR Take 10 mg by mouth daily.   traMADol 50 MG tablet Commonly known as: ULTRAM Take 50-100 mg by mouth every 6 (six) hours as needed.   Vitamin D  (Ergocalciferol ) 1.25 MG (50000 UNIT) Caps capsule Commonly known as: DRISDOL  TAKE 1 CAPSULE BY MOUTH ONE TIME PER WEEK        Allergies: Allergies[1]  Past Medical History, Surgical history, Social history, and Family History were reviewed and updated.  Review of Systems: All other 10 point review of systems is negative.   Physical Exam:  vitals were not taken for this visit.   Wt Readings from Last 3 Encounters:  01/13/23 140 lb (63.5 kg)  07/09/22 140 lb 3.2 oz (63.6 kg)  01/12/22 138 lb (62.6 kg)    Ocular: Sclerae unicteric, pupils equal, round and reactive to light Ear-nose-throat: Oropharynx clear, dentition fair Lymphatic: No cervical or supraclavicular adenopathy Lungs no rales or rhonchi, good excursion bilaterally Heart regular rate and rhythm, no murmur appreciated Abd soft, nontender, positive bowel sounds MSK no focal spinal tenderness, no joint edema Neuro: non-focal, well-oriented, appropriate affect Breasts: Deferred   Lab  Results  Component Value Date   WBC 8.7 01/12/2024   HGB 10.8 (L) 01/12/2024   HCT 33.4 (L) 01/12/2024   MCV 85.0 01/12/2024   PLT 294 01/12/2024   Lab Results  Component Value Date   FERRITIN 17 01/13/2023   IRON  53 01/13/2023   TIBC 386 01/13/2023   UIBC 333 01/13/2023   IRONPCTSAT 14 01/13/2023   Lab Results  Component Value Date   RETICCTPCT 1.3 04/23/2012   RBC 3.93 01/12/2024   No results found for: KPAFRELGTCHN, LAMBDASER, KAPLAMBRATIO No results found for: IGGSERUM, IGA, IGMSERUM No results found for: STEPHANY CARLOTA BENSON MARKEL EARLA JOANNIE DOC VICK, SPEI   Chemistry      Component Value Date/Time   NA 142 01/13/2023 1347   NA 139 12/11/2016 1252   NA 138 12/13/2015 1352   K 4.5 01/13/2023 1347   K 3.9 12/11/2016 1252   K 4.1 12/13/2015 1352   CL 105 01/13/2023 1347   CL 104 12/11/2016 1252   CO2 29 01/13/2023 1347   CO2 28 12/11/2016 1252   CO2 23 12/13/2015 1352   BUN 24 (H) 01/13/2023 1347   BUN 14 12/11/2016 1252   BUN 17.2 12/13/2015 1352   CREATININE 0.76 01/13/2023 1347   CREATININE 0.72 12/11/2016 1252   CREATININE 0.8 12/13/2015 1352      Component Value Date/Time   CALCIUM 9.7 01/13/2023 1347   CALCIUM 9.6 12/11/2016 1252   CALCIUM 9.7 12/13/2015 1352   ALKPHOS 53 01/13/2023 1347   ALKPHOS 80 12/11/2016 1252   ALKPHOS 79 12/13/2015 1352   AST 17 01/13/2023 1347   AST 14 12/13/2015 1352   ALT 15 01/13/2023 1347   ALT 13 12/13/2015 1352   BILITOT 0.2 01/13/2023 1347   BILITOT <0.22 12/13/2015 1352       Impression and Plan: Linda [name] is a very pleasant 73 yo African American female with history of stage IIa (T2N0M0) ductal carcinoma of the left breast, triple negative. She was diagnosed and treated over 18 yrs ago with lumpectomy and chemotherapy (Adriamycin/Cytoxan).  Mammogram due again in February 2026. Iron  studies pending. We will replace if needed.  Follow-up in 1 year.   Lauraine Pepper, NP 12/17/20251:54 PM     [1]  Allergies Allergen Reactions   Sulfa Antibiotics Rash and Hives

## 2024-03-01 NOTE — Progress Notes (Signed)
 Linda [name]                                          MRN: [id number]   03/01/2024   The [facility] Quality Team Specialist reviewed this patient medical record for the purposes of chart review for care gap closure. The following were reviewed: chart review for care gap closure-controlling blood pressure and kidney health evaluation for diabetes:eGFR  and uACR.    [facility] Quality Team

## 2025-01-10 ENCOUNTER — Inpatient Hospital Stay: Admitting: Family

## 2025-01-10 ENCOUNTER — Inpatient Hospital Stay
